# Patient Record
Sex: Female | Born: 1965 | Race: White | Hispanic: No | Marital: Single | Smoking: Never smoker
Health system: Southern US, Community
[De-identification: ages and names within clinical notes are randomized; demographics above are authoritative.]

## PROBLEM LIST (undated history)

## (undated) DIAGNOSIS — I10 Essential (primary) hypertension: Secondary | ICD-10-CM

## (undated) DIAGNOSIS — E559 Vitamin D deficiency, unspecified: Secondary | ICD-10-CM

## (undated) DIAGNOSIS — E785 Hyperlipidemia, unspecified: Secondary | ICD-10-CM

## (undated) DIAGNOSIS — E039 Hypothyroidism, unspecified: Secondary | ICD-10-CM

## (undated) DIAGNOSIS — D689 Coagulation defect, unspecified: Secondary | ICD-10-CM

## (undated) DIAGNOSIS — E669 Obesity, unspecified: Secondary | ICD-10-CM

## (undated) DIAGNOSIS — R7303 Prediabetes: Secondary | ICD-10-CM

## (undated) DIAGNOSIS — D509 Iron deficiency anemia, unspecified: Secondary | ICD-10-CM

## (undated) DIAGNOSIS — N6019 Diffuse cystic mastopathy of unspecified breast: Secondary | ICD-10-CM

## (undated) DIAGNOSIS — I2699 Other pulmonary embolism without acute cor pulmonale: Secondary | ICD-10-CM

## (undated) HISTORY — DX: Coagulation defect, unspecified: D68.9

## (undated) HISTORY — DX: Other pulmonary embolism without acute cor pulmonale: I26.99

## (undated) HISTORY — DX: Obesity, unspecified: E66.9

## (undated) HISTORY — DX: Hypothyroidism, unspecified: E03.9

## (undated) HISTORY — PX: TUBAL LIGATION: SHX77

## (undated) HISTORY — PX: TONSILLECTOMY AND ADENOIDECTOMY: SUR1326

## (undated) HISTORY — DX: Hyperlipidemia, unspecified: E78.5

## (undated) HISTORY — DX: Vitamin D deficiency, unspecified: E55.9

## (undated) HISTORY — DX: Prediabetes: R73.03

## (undated) HISTORY — DX: Essential (primary) hypertension: I10

## (undated) HISTORY — PX: THYROID SURGERY: SHX805

## (undated) HISTORY — DX: Iron deficiency anemia, unspecified: D50.9

## (undated) HISTORY — DX: Diffuse cystic mastopathy of unspecified breast: N60.19

---

## 1999-01-28 ENCOUNTER — Encounter: Payer: Self-pay | Admitting: Internal Medicine

## 1999-01-28 ENCOUNTER — Ambulatory Visit (HOSPITAL_COMMUNITY): Admission: RE | Admit: 1999-01-28 | Discharge: 1999-01-28 | Payer: Self-pay | Admitting: Internal Medicine

## 2000-05-27 ENCOUNTER — Encounter (INDEPENDENT_AMBULATORY_CARE_PROVIDER_SITE_OTHER): Payer: Self-pay

## 2000-05-27 ENCOUNTER — Inpatient Hospital Stay (HOSPITAL_COMMUNITY): Admission: AD | Admit: 2000-05-27 | Discharge: 2000-05-27 | Payer: Self-pay | Admitting: Obstetrics and Gynecology

## 2001-03-04 ENCOUNTER — Inpatient Hospital Stay (HOSPITAL_COMMUNITY): Admission: AD | Admit: 2001-03-04 | Discharge: 2001-03-10 | Payer: Self-pay | Admitting: Obstetrics and Gynecology

## 2001-03-04 ENCOUNTER — Encounter (INDEPENDENT_AMBULATORY_CARE_PROVIDER_SITE_OTHER): Payer: Self-pay | Admitting: Specialist

## 2001-03-05 ENCOUNTER — Encounter: Payer: Self-pay | Admitting: Obstetrics and Gynecology

## 2002-06-11 ENCOUNTER — Encounter: Payer: Self-pay | Admitting: Internal Medicine

## 2002-06-11 ENCOUNTER — Ambulatory Visit (HOSPITAL_COMMUNITY): Admission: RE | Admit: 2002-06-11 | Discharge: 2002-06-11 | Payer: Self-pay | Admitting: Internal Medicine

## 2002-09-30 ENCOUNTER — Encounter: Payer: Self-pay | Admitting: Otolaryngology

## 2002-09-30 ENCOUNTER — Encounter: Admission: RE | Admit: 2002-09-30 | Discharge: 2002-09-30 | Payer: Self-pay | Admitting: Otolaryngology

## 2002-10-14 ENCOUNTER — Other Ambulatory Visit: Admission: RE | Admit: 2002-10-14 | Discharge: 2002-10-14 | Payer: Self-pay | Admitting: Otolaryngology

## 2004-03-31 ENCOUNTER — Encounter: Admission: RE | Admit: 2004-03-31 | Discharge: 2004-03-31 | Payer: Self-pay | Admitting: Endocrinology

## 2004-09-09 ENCOUNTER — Other Ambulatory Visit: Admission: RE | Admit: 2004-09-09 | Discharge: 2004-09-09 | Payer: Self-pay | Admitting: Obstetrics and Gynecology

## 2004-12-11 DIAGNOSIS — I2699 Other pulmonary embolism without acute cor pulmonale: Secondary | ICD-10-CM

## 2004-12-11 HISTORY — DX: Other pulmonary embolism without acute cor pulmonale: I26.99

## 2004-12-11 HISTORY — PX: ABLATION: SHX5711

## 2005-01-20 ENCOUNTER — Inpatient Hospital Stay (HOSPITAL_COMMUNITY): Admission: EM | Admit: 2005-01-20 | Discharge: 2005-01-23 | Payer: Self-pay | Admitting: Emergency Medicine

## 2005-09-11 ENCOUNTER — Other Ambulatory Visit: Admission: RE | Admit: 2005-09-11 | Discharge: 2005-09-11 | Payer: Self-pay | Admitting: Obstetrics and Gynecology

## 2005-10-06 ENCOUNTER — Encounter (INDEPENDENT_AMBULATORY_CARE_PROVIDER_SITE_OTHER): Payer: Self-pay | Admitting: *Deleted

## 2005-10-06 ENCOUNTER — Ambulatory Visit (HOSPITAL_COMMUNITY): Admission: RE | Admit: 2005-10-06 | Discharge: 2005-10-06 | Payer: Self-pay | Admitting: Obstetrics and Gynecology

## 2007-05-17 ENCOUNTER — Ambulatory Visit: Payer: Self-pay | Admitting: Vascular Surgery

## 2007-08-30 ENCOUNTER — Ambulatory Visit: Payer: Self-pay | Admitting: Vascular Surgery

## 2007-10-09 ENCOUNTER — Ambulatory Visit: Payer: Self-pay | Admitting: Vascular Surgery

## 2007-10-15 ENCOUNTER — Ambulatory Visit: Payer: Self-pay | Admitting: Vascular Surgery

## 2007-12-11 ENCOUNTER — Ambulatory Visit: Payer: Self-pay | Admitting: Vascular Surgery

## 2007-12-18 ENCOUNTER — Ambulatory Visit: Payer: Self-pay | Admitting: Vascular Surgery

## 2009-03-25 ENCOUNTER — Ambulatory Visit (HOSPITAL_COMMUNITY): Admission: RE | Admit: 2009-03-25 | Discharge: 2009-03-25 | Payer: Self-pay | Admitting: Internal Medicine

## 2011-01-01 ENCOUNTER — Encounter: Payer: Self-pay | Admitting: Internal Medicine

## 2011-04-25 NOTE — Assessment & Plan Note (Signed)
OFFICE VISIT   Andrea Bush, Andrea Bush  DOB:  [birth year]                                       10/15/2007  [id number]#:[id number]   The patient underwent left greater saphenous laser ablation by Dr. Arbie Cookey  on October 29 with multiple stab phlebectomies in the lateral thigh and  calf, and medial calf regions.  She has done well with no change in her  distal edema, and no discomfort below the knee.  She has had some mild  to moderate aching discomfort along the course of the greater saphenous  vein from the knee to the groin, and some irritation from the elastic  compression stocking with some blistering of the skin medially in the  mid thigh.  She is now wearing an Ace wrap rather than the elastic  stocking for that reason.  She states this all is improving, however.  I  performed a limited venous duplex exam in the office today.  The deep  venous system is widely patent with no evidence of any abnormality and  good flow.  The greater saphenous vein is occluded from near the  saphenofemoral junction to the knee.  There is 1 patent anterior branch,  but it communicates well below the saphenofemoral junction in the thigh.  The patient was reassured regarding these findings.  Will be scheduled  in the near future for laser ablation of her contralateral right side  with stab phlebectomy.   Quita Skye Hart [place], M.D.  Electronically Signed   JDL/MEDQ  D:  10/15/2007  T:  10/16/2007  Job:  [id number]

## 2011-04-25 NOTE — Consult Note (Signed)
VASCULAR SURGERY CONSULTATION   Bush, Andrea  DOB:  [birth year]                                       05/17/2007  [id number]#:[id number]   The patient presents today for evaluation of severe venous hypertension  bilaterally.  She is a very pleasant 45 year old white female with  progressive pain and swelling in both lower extremities.  She has had  lower extremity Dopplers to rule out deep vein thrombosis in the past  but has never had any functional studies.   PAST HISTORY:  1. Significant for pulmonary embolism diagnosed by CT scan in 2006.      This is felt in all likelihood to be related to birth control pills      with hypercoagulability and she was treated with 6 months of      Coumadin.  She did not have any evidence of DVT at that time.  She      does have a prior history of what sounds like superficial      thrombophlebitis on her left saphenous vein from her ankle up into      her thigh.  She describes this occurred several years ago and at      that time had no evidence of DVT by Duplex.  She reports she had      erythema and swelling and pain over her medial thigh which      resolved.  She has had sclerotherapy attempts in the past with      early recurrence.  She currently has pain in both calves and thighs      and these remain tender to touch over the tributary varicosities.      She does have swelling greater than in her left leg than in her      right leg.  She does not have any history of bleeding or skin      changes.  She reports that she takes Aleve constantly for her leg      pain and elevates her legs when she can.  She has not tried      compression garments.  2. Past history is significant for a tonsillectomy at age 48.  69. She had a tubal ligation in October 2006.   She does not smoke or drink alcohol.   MEDICATIONS:  Hydrochlorothiazide for attempting to relieve the swelling  in her leg and levothyroxine.   PHYSICAL EXAMINATION:   General:  She is a well developed, moderately  obese, white female in no acute distress.  Vital signs:  Blood pressure  is 136/86, pulse 72, respirations 16.  Vascular:  Her dorsalis pedis  pulses are 2+ bilaterally.  She has scattered reticular veins most  particularly in her right posterior calf and left lateral thigh with  some telangiectasia around these as well.  She does have bilateral leg  swelling.   She underwent a hand-held Duplex by me in the office and this reveals  distended saphenous veins bilaterally with reflux by color flow Doppler.   I discussed options with Andrea Bush.  I explained that she clearly has  venous hypertension and I suspect this is causing the significant pain  that she is having with prolonged standing.  I have recommended that we  fit her with a graduated compression garments and she has purchased  these today and will begin  wearing these immediately with thigh high  graduated compression stockings.   We will see her back in several months with a formal bilateral venous  Duplex to evaluate her deep system, and also we will determine her  success with compression garments.  I explained that if she fails  conservative treatment, she should be an excellent candidate for laser  ablation of her saphenous vein, assuming her deep system is normal.  We  will discuss this further over her next visit in 3 months.   Andrea Bush, M.D.  Electronically Signed  TFE/MEDQ  D:  05/17/2007  T:  05/17/2007  Job:  [id number]   cc:   [name], D.O.

## 2011-04-25 NOTE — Assessment & Plan Note (Signed)
OFFICE VISIT   Andrea Bush, Andrea Bush  DOB:  [birth year]                                       12/18/2007  EAVWU#:[id number]   Patient presents today for a one-week followup of laser ablation and  stab phlebectomy of right leg greater saphenous vein.  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  She has done well.  She does  have the usual amount of erythema at the ablation site and no specific  discomfort at the phlebectomy site.   She underwent venous duplex showing closure of her saphenous vein and  wide patency of her common femoral vein.   She will continue her usual activities.  Compression garments for one  additional week.  We will see her in six weeks for final followup.   Larina Earthly, M.D.  Electronically Signed   TFE/MEDQ  D:  12/18/2007  T:  12/19/2007  Job:  [id number]

## 2011-04-25 NOTE — Assessment & Plan Note (Signed)
OFFICE VISIT   Andrea, Bush  DOB:  1966/08/03                                       08/30/2007  JYNWG#:[id number]   The patient presents today for continued discussion of her venous  pathology.  I saw her in June.  On review she does have a history of  pulmonary embolism in the past felt to be related to birth control pills  and was on Coumadin therapy for six months.  She was fitted with thigh-  high graduated compression stockings at our last visit.  She reports  these have given her no relief for her discomfort.  She has also  frequently elevated her legs and takes Ibuprofen tid with no relief of  symptoms. She reports she continues to have pain and swelling in her  legs with prolonged standing or prolonged sitting.  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  She is  unable to walk on her treadmill and exercise due to leg pain and  swelling and reports that her calves are very painful and sensitive to  the touch causing difficulty even in sleeping with pressure over the  tributary varicosities in her calf.   PHYSICAL EXAMINATION:  She has no change.  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   She underwent formal duplex today in our office and I discussed this  with her.  This reveals competence in her deep systems bilaterally with  no evidence of reflux or obstruction.  She does have incompetence in her  saphenofemoral junction and greater saphenous vein bilaterally.   PLAN:  I have discussed options with the patient.  I have recommended  that we proceed with staged greater saphenous vein laser ablation and  stab phlebectomy  for symptom relief.  She understands the procedure.  We will proceed with this once we have insurance coverage for her.   Larina Earthly, M.D.  Electronically Signed   TFE/MEDQ  D:  08/30/2007  T:  09/02/2007  Job:  [id number]   cc:   [name],  D.O.

## 2011-04-25 NOTE — Procedures (Signed)
LOWER EXTREMITY VENOUS REFLUX EXAM   INDICATION:  Bilateral legs varicose vein and pain.   EXAM:  Using color-flow imaging and pulse Doppler spectral analysis, the  right and left common femoral, superficial femoral vein, popliteal,  posterior tibial, greater and lesser saphenous veins are evaluated.  There is no evidence suggesting deep venous insufficiency in the right  and left lower extremity.   The right saphenofemoral junction is not competent.  The right and left  greater saphenous vein is not competent.   The right and left proximal short saphenous vein demonstrates  competency.   GSV Diameter (used if found to be incompetent only)                                            Right    Left  Proximal Greater Saphenous Vein           0.56. cm 0.56. cm  Proximal-to-mid-thigh                     0.56. cm 0.56. cm  Mid thigh                                 0.56. cm 0.57. cm  Mid-distal thigh                          0.64. cm 0.57. cm  Distal thigh                              0.64. cm 0.49. cm  Knee                                      0.62. cm 0.58. cm   IMPRESSION:  1. Right and left greater saphenous vein reflux is identified with the      caliber ranging from on the right 0.62 cm to 0.69 cm and on the      left 0.58 cm to 0.98.  2. The right and left greater saphenous vein is not aneurysmal.  3. The right and left greater saphenofemoral junction is not tortuous.  4. The deep venous system is competent.  5. The right and left lesser saphenous vein is competent.  6.  No evidence of DVT noted in bilateral legs.   ___________________________________________  Larina Earthly, M.D.   MG/MEDQ  D:  08/30/2007  T:  08/31/2007  Job:  [id number]

## 2011-04-28 NOTE — Discharge Summary (Signed)
[facility]  Patient:    Andrea Bush, Andrea Bush                    MRN: [id number] Adm. Date:  [id number] Disc. Date: [id number] Attending:  Marcelle Overlie                           Discharge Summary  ADMISSION DIAGNOSES:          1. Intrauterine gestation at 31+5 weeks.                               2. Severe preeclampsia with HELLP syndrome.  DISCHARGE DIAGNOSES:          1. Intrauterine gestation at 31+5 weeks.                               2. Primary low segment transverse cesarean                                  section on March 06, 2001.                               3. Uvulitis.  PROCEDURE:                    Primary low segment transverse cesarean section was performed under the direction of Dr. Annamaria Helling on March ______, 2002 at the [facility].  HISTORY AND PHYSICAL:         The patient was a 45 year old gravida 2, para 0 Caucasian female at 31+[redacted] weeks gestation who was admitted on March 04, 2001 from the office for hypertension, proteinuria, and substernal right upper quadrant and epigastric pain of several days duration.  The patients blood pressures were in the 160/90 range at the office and she was noted to have 3+ proteinuria.  When the patient arrived in triage at the [facility] the blood pressures were noted to be in the 160-180 range over 90s.  The fetal heart rate tracing at that time was noted to be reassuring.  Laboratory studies documented AST 110, ALT 90, LDH 283, uric acid 5.3, hemoglobin 13.1, hematocrit 36.9, platelets 131,000, urine 3+ protein.  The patient was admitted with severe preeclampsia and HELLP syndrome and she received magnesium sulfate for seizure prophylaxis and a course of betamethasone for fetal lung maturity.  The patient was monitored closely at this time.  The patient remained stable during her first 24 hours and then she was noted to develop third spacing of fluid.  On March 27 in the evening  the patient was noted to have elevation of the SGOT to 115 and the SGPT to 121. Her platelet count was noted to be decreasing to 177,000.  At this time discussion was held with the patient regarding her worsening clinical situation and the fact that the betamethasone had been administered for the benefit of fetal lung maturity and a decision was made to proceed with a primary low segment transverse cesarean section at that time.  The patient underwent the primary low segment transverse cesarean section under the direction of Dr. Aldona Bar on March 06, 2001.  A viable female was delivered with Apgars of 6  at one minute and 7 at five minutes.  There were no maternal complications to the surgery.  Upon leaving the operating theater, Dr. Alison Murray, a neonatologist, noted that there was a partial avulsion of the newborns left ear where it was attached to the skull and the newborn underwent further consultation by the pediatric surgeons during the hospitalization.  Postoperatively the patient was continued on magnesium sulfate for approximately 48 hours for seizure prophylaxis.  She was continued on the medication as she was noted to have thrombocytopenia with a platelet count nadiring at 92,000.  Also, the patients LDH was noted to be rising to 477. After this time the patient was noted to have excellent diuresis and good O2 saturations of 96% on room air.  The magnesium was discontinued on the morning of March 09, 2001.  At this time the patient was noted to complain of the feeling that something was swollen in her throat.  She was examined and the uvula was noted to have edema with some erythema and a small amount of white exudate.  The patient had no respiratory distress at this time.  A course of amoxicillin 500 mg p.o. t.i.d. was begun at this time.  The patients postoperative course otherwise had been unremarkable.  She was able to tolerate a regular diet and ambulate independently.  With  ambulation her blood pressures were noted to be in the 160/100 range and therefore upon discharge she was started on Procardia 10 mg p.o. t.i.d.  The patients incision remained without evidence of any erythema or drainage and the staples were removed prior to her discharge.  The patient was found to be ready for discharge on postoperative day #4.  She was discharged to home in good condition.  DISCHARGE MEDICATIONS:        1. Procardia 10 mg p.o. t.i.d.                               2. Percocet one to two p.o. q.4-6h. p.r.n.                               3. Ibuprofen 600 mg p.o. q.d.                               4. Iron sulfate 325 mg p.o. q.d.                               5. Prenatal vitamins one p.o. q.d.                               6. Z-pack to be completed as the patient was                                  noted to have an allergy to penicillin.  DIET:                         Regular.  ACTIVITY:                     The patient will have decreased activity for the next six  weeks.  FOLLOW-UP:                    Later that week for a blood pressure check.  She will call if she experiences fever, increased bleeding, increased pain, headache, dizziness, visual changes, increased swelling, or any other concern. DD:  04/09/01 TD:  04/09/01 Job: [id number]

## 2011-04-28 NOTE — H&P (Signed)
NAMEMABELINE, VARAS              ACCOUNT NO.:  [id number]   MEDICAL RECORD NO.:  [id number]          PATIENT TYPE:  EMS   LOCATION:  ED                           FACILITY:  [facility]   PHYSICIAN:  Toby L. Fugate, D.O.   DATE OF BIRTH:  [birth year]   DATE OF ADMISSION:  01/20/2005  DATE OF DISCHARGE:                                HISTORY & PHYSICAL   PRIMARY CARE PHYSICIAN:  [name], D.O.   CHIEF COMPLAINT:  Pulmonary embolus diagnosed by CT scan.   HISTORY OF PRESENT ILLNESS:  Mrs. Steinhart is a 45 year old Caucasian female  who presented to Dr. Hardie Pulley office on Thursday complaining of left leg  swelling and pain.  Dr. Elisabeth Most ordered lower extremity Dopplers which  were negative.  Subsequent CT scan was positive for pulmonary embolus.  At  this time I do not have these reports available.  These studies were done at  [facility].  The patient has been on birth control since  October.  She has a Health and safety inspector job.  However, she says that she does get up  often throughout the day to walk around.  There has been no recent travel.  There is no history of recent surgery or cancer.  The patient says that her  grandmother did have a blood clot at one point.  Currently the patient is  not short of breath.  Her O2 saturations are 100% on 2 L.  She says that has  a heaviness about her chest.  This heaviness does not move.  There is no  shortness of breath associated with this discomfort.  There is n no nausea,  vomiting, or diaphoresis.   PAST MEDICAL HISTORY/PAST SURGICAL HISTORY:  1.  Preeclampsia with HELLP syndrome.  2.  Cesarean section.  3.  Hypothyroidism.  4.  Tonsillectomy.   MEDICATIONS:  1.  Synthroid 100 mcg p.o. daily.  2.  Birth control.   ALLERGIES:  PENICILLIN causes a rash.   SOCIAL HISTORY:  The patient denies tobacco, alcohol and IV drug abuse.  The  patient lives here in [place] with her husband. She has a Investment banker, corporate  job.   FAMILY HISTORY:   Strong family history of diabetes and, as stated above,  grandmother had a blood clot.   REVIEW OF SYSTEMS:  Complete review of systems was obtained.  The review was  negative except as stated in the HPI.   PHYSICAL EXAMINATION:  VITAL SIGNS: Temperature 98, blood pressure 149/99,  pulse 98, respiratory rate 18, O2 saturation 100% on 2 L.  HEENT: Pupils were equally, round and react to light.  Extraocular muscles  were intact.  There was no scleral icterus.  Tympanic membranes were clear  bilaterally.  Oropharynx was clear and moist.  No erythema or thrush.  NECK:  No JVD.  No adenopathy.  No carotid bruits.  HEART:  Regular rate and rhythm.  No murmur, rub, or gallops.  LUNGS:  Clear to auscultation bilaterally.  No wheezes, rales or rhonchi.  ABDOMEN:  Positive bowel sounds, nontender, nondistended.  EXTREMITIES:  Bilateral extremities showed 1+ edema.  The  left lower  extremity was warm to touch around the medial aspect of the knee.  There was  some erythema of the skin.  The left lower extremity was tender to touch.   LABORATORY DATA:  All labs are pending at this point.   ASSESSMENT/PLAN:  1.  Pulmonary embolus.  I will admit the patient to a telemetry bed since      she is hemodynamically stable.  I will request old records from      [facility].  I will start the patient on heparin per      hospital protocol.  In addition, I will start the patient on Coumadin 5      mg p.o. daily.  I will check daily INR.  Will also check three sets of      cardiac enzymes and an EKG.  Basic labs will be obtained including CBC,      CMP, PT, PTT, and INR.  2.  Hypothyroidism.  I will check a TSH and keep the patient on her      Synthroid.  3.  This patient is full code.      TLF/MEDQ  D:  01/20/2005  T:  01/20/2005  Job:  [id number]

## 2011-04-28 NOTE — Op Note (Signed)
NAME:  Andrea Bush, GALATI NO.:  [id number]   MEDICAL RECORD NO.:  [id number]          PATIENT TYPE:  AMB   LOCATION:  SDC                           FACILITY:  [facility]   PHYSICIAN:  Carrington Clamp, M.D. DATE OF BIRTH:  12/01/1966   DATE OF PROCEDURE:  10/06/2005  DATE OF DISCHARGE:                                 OPERATIVE REPORT   PREOPERATIVE DIAGNOSIS:  Menorrhagia and multiparous, desires permanent  sterility.   POSTOPERATIVE DIAGNOSIS:  Menorrhagia and multiparous, desires permanent  sterility.   PROCEDURES:  Laparoscopic bilateral tubal ligation with Filshie clips,  hysteroscopy, dilation and curettage, and NovaSure ablation.   SURGEON:  Carrington Clamp, M.D.   ASSISTANT:  None.  NovaSure representative, Posey Boyer was in attendance.   ANESTHESIA:  Was general.   SPECIMENS:  Endometrial curettings.   ESTIMATED BLOOD LOSS:  Minimal.   IV FLUIDS:  1500 mL. Urine output was not measured.   COMPLICATIONS:  None.   FINDINGS:  Were a 9 cm sound length to the uterus with a 3.5 cm cervical  length which gave a 5.5 cm cavity length. With the uterus was 4.8, power was  145.   PROCEDURE:  NovaSure ablation took 1 minute 20 seconds. There was normal  ovaries seen bilaterally. The left hand tube was essentially normal but the  right hand tube was adhesed to the anterior fundus. The tube was carefully  traced out to ensure that I had the tube and the not the round ligament. The  round ligament was seen and identified well away from the segments that were  closed with a Filshie clips. The uterine cavity was essentially normal with  just shaggy endometrium.   MEDICATIONS:  Were none.   TECHNIQUE:  After adequate general anesthesia was achieved, the patient was  prepped, draped in sterile fashion in dorsal lithotomy position. The patient  had been given a dose of 40 milligrams of Lovenox subcu prior to the  procedure because her history of PE. A speculum was  placed in the vagina and  the single-tooth tenaculum was placed on the cervix. The Kahn cannula was  placed inside the cervix and the speculum removed. The bladder was drained  with a red rubber catheter during the scope. Attention was then turned to  the abdomen where 2 cm infraumbilical incision was made with scalpel. The  Veress needle was passed into the abdomen without aspiration of bowel  contents or blood. The abdomen was insufflated and the 10 mm trocar placed  without complication. The single operative scope was passed through the  trocar and the Filshie clip instrument with it. As noted on the right-hand  side, the tube was folded upon itself, so I chose to place two clips one on  the proximal isthmic portion and one on the distal isthmic portion. The tube  was able to be traced out and it was proven that both of those segments were  tube and not the round ligament. Attention was then turned to left where two  Filshie clips were placed on the left as well. The clips were seen and  identified around the entirety of each tube. The abdomen was desufflated and  instruments withdrawn from the abdomen keeping the 10 mm trocar in place.  Attention was turned to the vagina. Speculum was placed in the vagina and  the Kahn cannula removed. The cervix was measured at 3.5 cm and the uterus  sounded to 9 cm. The cervix was then dilated up to allow the hysteroscope to  pass and the findings were noted. A vigorous curettage then performed. The  tissue sent to pathology. The cervix was then dilated a little more again  after the hysteroscope confirmed the length of the cervix and the NovaSure  instrument was passed into the cavity. With a measurement of 4.8 cm was  achieved and the cavity passed the CO2 insufflation test. The NovaSure  ablation then proceeded for 1 minute 20 seconds at a power of 145 watts  without complication. The hysteroscope was passed into the uterine cavity  again and good  contact of all surfaces was noted. All instruments were then  withdrawn from the vagina and the sponge forceps placed in the vagina to aid  in the scope above. The cervical tenaculum site was hemostatic.   Gloves were changed and attention was then turned to the abdomen. The scope  was passed into the abdomen and again abdomen insufflated. All the surfaces  were noted to be normal. The scope and trocar were then removed and the 10  mm trocar site fascia was closed with a figure-of-eight stitch of 2-0  Vicryl. The skin was closed with Dermabond. The patient tolerated the  procedure well. She returns to recovery room in stable condition.      Carrington Clamp, M.D.  Electronically Signed     MH/MEDQ  D:  10/06/2005  T:  10/06/2005  Job:  [id number]

## 2011-04-28 NOTE — Op Note (Signed)
[facility]  Patient:    Andrea Bush, Andrea Bush                    MRN: [id number] Proc. Date: 03/06/01 Adm. Date:  [id number] Attending:  Marcelle Overlie                           Operative Report  PATIENT AGE:                  45  PREOPERATIVE DIAGNOSIS:       1. Thirty-two week intrauterine pregnancy.                               2. Severe preeclampsia.                               3. Hemolysis, elevated liver enzymes and low                                  platelet count (HELLP) syndrome with                                  worsening liver function tests, dropping                                  platelet count.   POSTOPERATIVE DIAGNOSIS:      1. Thirty-two week intrauterine pregnancy.                               2. Severe preeclampsia.                               3. Hemolysis, elevated liver enzymes and low                                  platelet count (HELLP) syndrome with                                  worsening liver function tests, dropping                                  platelet count.                               4. Delivery of 3 pound 2 ounce female infant,                                  Apgars 6/7.  OPERATION:                    Primary low transverse cesarean section.  SURGEON:  Gerrit Friends. Aldona Bar, M.D.  ANESTHESIA:                   Spinal, Gretta Cool., M.D.  HISTORY:                      This 45 year old, gravida 2, para 0, was admitted at 22 to [redacted] weeks gestation on March 04, 2001, having been sent form the office with hypertension, proteinuria, and complaints of substernal and right upper quadrant and epigastric pain for several days.  In the office, her blood pressure was 160/92, and she had 3+ proteinuria.  She was sent to triage where it became apparent that she likely had severe preeclampsia and HELLP syndrome.  Her liver function tests were elevated, and her platelet count  was depressed.  She was placed on magnesium sulfate, admitted to antenatal, begun on betamethasone protocol, and watched carefully.  Her betamethasone was completed on the morning of March 26.  During the day on March 26, she was noted to be third spacing fluid.  On the morning of March 27, fetal heart was reactive.  Cervix was long, thick, closed, and presenting part high.  She was almost 24 hours post second dose of betamethasone, and her lab values revealed improvement in her platelet count (probably secondary to betamethasone) and no real change in the SGOT or SGPT. Her output was noted to be increasing.  She was observed during the day on March 27, and lab values were repeated on the afternoon of March 27, and at this time, her liver function tests were noted to be worsening.  The SGOT had risen from 80 to 115, and the SGPT had risen from 98 to 121.  Likewise, her platelet count appeared to be falling, now was 177,000 from a high of 207,000 on the morning of March 27.  (Platelet count had been in the low 100s previously.)   Since the affect of betamethasone was at its maximum at this point in time, and since the patients situation would be likely to worsen over time, I did not feel delay in delivery any further was indicated.  After talking with the patient and her husband, decided to proceed with primary low transverse cesarean section.  DESCRIPTION OF PROCEDURE:     The patient was taken to the operating room. After the satisfactory induction of a spinal anesthetic by Dr. Harvest Forest, the patient was prepped and draped.  The patient had a Foley catheter in place. Once good anesthetic levels were documented and the patient was draped adequately, the procedure was begun.  A Pfannenstiel incision was made, and with minimal difficulty, dissected down sharply to and through the fascia in low transverse fashion.  Muscles were separated in the midline, peritoneum identified and entered  appropriately with care taken to avoid the bowel superiorly and the bladder inferiorly.  At this time, the vesicouterine peritoneum was incised in a low transverse fashion and pushed off from the low uterine segment.  Sharp incision in the uterus with Metzenbaum scissors was then carried out.  The low uterine segment was noted to be extremely thick. With some difficulty, it was finally possible to enter the cavity.  Amniotomy was produced, and fluid was clear.  The cavity was extended laterally, and thereafter delivery of a viable female infant with Apgars of 6/7 was carried out from a vertex position.  The infant was left side of the head down at the time of delivery.  Once  the cord was clamped and cut, the infant was passed off to Dr. Alison Murray. Apgars were noted to be 6/7.  The infant was taken to the intensive care unit in care of the team.  After the cord bloods were collected, the placenta was delivered intact and was sent to pathology labelled appropriately.  The umbilical cord had three vessels.  At this time, the uterus was exteriorized, manually stimulated, and good contractility was noted.  The uterus was rendered free of any remaining products of conception, and then closure of the uterine incision was carried out using #1 Vicryl in a running locking fashion with several figure-of-eight sutures applied afterwards for additional hemostasis.   Tubes and ovaries were noted to be normal.  At this time, the uterus was replaced into the abdomen having been washed of all free blood and clot.  After all counts were noted to be correct, and no foreign bodies were noted to be remaining in the abdominal cavity, closure of the abdomen was carried out.  The abdominal peritoneum was closed with 0 Vicryl in a running fashion.  The muscles were secured with the same.  When assured of good subfascial hemostasis, the fascia was the reapproximated with 0 Vicryl from angle to midline bilaterally.   Subcutaneous tissue was rendered hemostatic, and staples were then used to close the skin. A sterile pressure dressing was applied.  The patient at this time was taken  to the recovery room in satisfactory condition having tolerated the procedure well.  Upon leaving the operating theater, Dr. Alison Murray mentioned that there appeared to be an avulsion where the babys left ear attached to the skull at the superior portion.  This area was not bleeding but probably was going to require having a pediatric surgeon repair it.  I went to the nursery after the procedure to visualize this.  There was about a 1+ cm  what appeared to be an avulsion, and the only way I can figure this possibly had happened, since this was the posterior ear, was that, in placing my hand around the babys head trying to effect delivery, I must have pushed back on the ear and created the avulsion.  Nonetheless, according to Dr. Alison Murray, it looked like it would be fairly simple to fix, and he was going to call the pediatric surgeons to come look at this.  Both mother and the babys father were aware of this and did not appear to be particularly upset that this occurred.  Once in the recovery room, the patient was having good clear urine output. She will be observed in the intensive care unit and continued on magnesium sulfate with labs watched very carefully over the next 24 to 48 hours. Hopefully her HELLP syndrome and severe preeclampsia will now resolve.  All counts noted to be correct.  Estimated blood loss 750 cc. DD:  03/06/01 TD:  03/07/01 Job: [id number]

## 2011-04-28 NOTE — Discharge Summary (Signed)
Andrea Bush, Andrea Bush              ACCOUNT NO.:  [id number]   MEDICAL RECORD NO.:  [id number]          PATIENT TYPE:  INP   LOCATION:  0373                         FACILITY:  [facility]   PHYSICIAN:  Isidor Holts, M.D.  DATE OF BIRTH:  05/28/1966   DATE OF ADMISSION:  01/20/2005  DATE OF DISCHARGE:  01/23/2005                                 DISCHARGE SUMMARY   PMD:  [name], D.O.   DISCHARGE DIAGNOSES:  1.  Bilateral pulmonary emboli.  2.  Mild sinusitis.  3.  Dysthyroidism.   DISCHARGE MEDICATIONS:  1.  Lovenox 120mg  subcutaneously q.12 hourly.  2.  Coumadin 7.5 mg p.o. q.d. or per INR.  3.  Flonase nasal spray, 1 spray each nostril q.d. p.r.n.  4.  Synthroid 100 mcg p.o. q.d.  5.  Oral contraceptive medication discontinued.   PROCEDURE:  Chest CT angiogram dated January 21, 2005.  This showed small  filling defects within the branches of the pulmonary artery to the right  lower lobe and right middle lobe consistent with small pulmonary emboli.  Also question filling defect in branch to the left lower lobe.   CONSULTATIONS:  None.   HISTORY OF PRESENT ILLNESS:  As in H&P of January 20, 2005; however, in  brief, this is 45 year old Caucasian female who presented to her PMD's  office on January 19, 2005, complaining of left leg swelling and pain. Lower  extremity Dopplers at that time were negative. Subsequent chest CT scan was  positive for pulmonary emboli. This was carried out at [facility]. She was referred for admission and management. The patient has a  sedentary job and is currently on oral contraceptive medication. She has a  known history of hypothyroidism.   CLINICAL COURSE:  #1.  Pulmonary emboli.  The patient was admitted for further investigation,  evaluation and management and was managed with anticoagulation, consisting  initially of intravenous heparin infusion and oral Coumadin.  CT chest  angiogram dated January 21, 2005, confirmed  pulmonary emboli predominantly  in the right lung although there is questionable involvement of left lung.  Hospital course was smooth. The patient remained asymptomatic, maintained  oxygen saturation, did not get short of breath, had no chest pain, so that  on January 23, 2005, it was thought appropriate to switch her to  subcutaneous Lovenox injection and arrange discharge.  INR on January 23, 2005 is suboptimal at 1.6.  Oral contraceptive medication has been  discontinued and she has been cautioned to avoid NSAIDs or indeed any other  medication without checking first with her PMD. She may, however, utilize  Tylenol for sundry aches and pains. Thrombophillic screen was arranged on  admission, but results are still pending at time of discharge   #2.  Mild sinusitis. The patient at the time of admission, complained of  sinus headaches and mild nasal congestion. This was managed with p.r.n.  Tylenol and also Flonase nasal spray with satisfactory response.   #3.  History of dysthyroidism on Synthroid replacement therapy.  TSH on  January 20, 2005 was 3.52 i.e. indicative of adequate thyroid  replacement  therapy. No change has been made to her preadmission regimen.   DISPOSITION:  The patient was discharged in satisfactory condition, on  January 23, 2005. She has been cautioned to avoid strenuous activity,  especially straining, lifting, pushing, pulling, etc. until reevaluated by  her PMD. She is recommended off work until seen again by her PMD on February 01, 2005.  Arrangements have been put in place for her to check PT/INR at  her PMD's office on January 25, 2005 at 11:30 a.m.  Dr. Elisabeth Most is to  adjust Coumadin dose thereafter. Followup has been scheduled with Dr. Marisue Brooklyn, PMD, on February 01, 2005 at 10:00 a.m.  This has been  communicated to patient.      CO/MEDQ  D:  01/23/2005  T:  01/23/2005  Job:  [id number]   cc:   [name], D.O.  [address]  [place]  [place] [address]  Fax: [phone]

## 2011-06-30 ENCOUNTER — Other Ambulatory Visit: Payer: Self-pay | Admitting: Obstetrics and Gynecology

## 2013-10-30 ENCOUNTER — Other Ambulatory Visit: Payer: Self-pay | Admitting: Internal Medicine

## 2013-11-10 ENCOUNTER — Other Ambulatory Visit: Payer: Self-pay

## 2013-11-21 ENCOUNTER — Other Ambulatory Visit: Payer: Self-pay | Admitting: Physician Assistant

## 2013-11-21 DIAGNOSIS — E039 Hypothyroidism, unspecified: Secondary | ICD-10-CM

## 2013-11-24 ENCOUNTER — Other Ambulatory Visit (INDEPENDENT_AMBULATORY_CARE_PROVIDER_SITE_OTHER): Payer: Medicare HMO

## 2013-11-24 DIAGNOSIS — E039 Hypothyroidism, unspecified: Secondary | ICD-10-CM

## 2013-11-25 ENCOUNTER — Telehealth: Payer: Self-pay

## 2013-11-25 NOTE — Telephone Encounter (Signed)
LMOM at work and cell with results of TSH and instruction, ok to leave messages per patient registration form

## 2014-09-23 ENCOUNTER — Encounter: Payer: Self-pay | Admitting: Physician Assistant

## 2014-09-26 DIAGNOSIS — E559 Vitamin D deficiency, unspecified: Secondary | ICD-10-CM | POA: Insufficient documentation

## 2014-09-26 DIAGNOSIS — I10 Essential (primary) hypertension: Secondary | ICD-10-CM | POA: Insufficient documentation

## 2014-09-26 DIAGNOSIS — E1169 Type 2 diabetes mellitus with other specified complication: Secondary | ICD-10-CM | POA: Insufficient documentation

## 2014-09-26 DIAGNOSIS — E785 Hyperlipidemia, unspecified: Secondary | ICD-10-CM | POA: Insufficient documentation

## 2014-09-30 ENCOUNTER — Ambulatory Visit (INDEPENDENT_AMBULATORY_CARE_PROVIDER_SITE_OTHER): Payer: Medicare HMO | Admitting: Physician Assistant

## 2014-09-30 ENCOUNTER — Encounter: Payer: Self-pay | Admitting: Physician Assistant

## 2014-09-30 VITALS — BP 120/78 | HR 76 | Temp 98.1°F | Resp 16 | Ht 69.0 in | Wt 279.0 lb

## 2014-09-30 DIAGNOSIS — D509 Iron deficiency anemia, unspecified: Secondary | ICD-10-CM | POA: Insufficient documentation

## 2014-09-30 DIAGNOSIS — R6889 Other general symptoms and signs: Secondary | ICD-10-CM

## 2014-09-30 DIAGNOSIS — N6019 Diffuse cystic mastopathy of unspecified breast: Secondary | ICD-10-CM

## 2014-09-30 DIAGNOSIS — E669 Obesity, unspecified: Secondary | ICD-10-CM

## 2014-09-30 DIAGNOSIS — I10 Essential (primary) hypertension: Secondary | ICD-10-CM

## 2014-09-30 DIAGNOSIS — Z0001 Encounter for general adult medical examination with abnormal findings: Secondary | ICD-10-CM

## 2014-09-30 DIAGNOSIS — E039 Hypothyroidism, unspecified: Secondary | ICD-10-CM | POA: Insufficient documentation

## 2014-09-30 LAB — CBC WITH DIFFERENTIAL/PLATELET
BASOS ABS: 0 10*3/uL (ref 0.0–0.1)
Basophils Relative: 0 % (ref 0–1)
Eosinophils Absolute: 0.2 10*3/uL (ref 0.0–0.7)
Eosinophils Relative: 3 % (ref 0–5)
HEMATOCRIT: 43.4 % (ref 36.0–46.0)
HEMOGLOBIN: 14.9 g/dL (ref 12.0–15.0)
LYMPHS PCT: 44 % (ref 12–46)
Lymphs Abs: 3.5 10*3/uL (ref 0.7–4.0)
MCH: 30.4 pg (ref 26.0–34.0)
MCHC: 34.3 g/dL (ref 30.0–36.0)
MCV: 88.6 fL (ref 78.0–100.0)
MONO ABS: 0.6 10*3/uL (ref 0.1–1.0)
Monocytes Relative: 7 % (ref 3–12)
NEUTROS ABS: 3.6 10*3/uL (ref 1.7–7.7)
NEUTROS PCT: 46 % (ref 43–77)
Platelets: 251 10*3/uL (ref 150–400)
RBC: 4.9 MIL/uL (ref 3.87–5.11)
RDW: 13.2 % (ref 11.5–15.5)
WBC: 7.9 10*3/uL (ref 4.0–10.5)

## 2014-09-30 LAB — HEMOGLOBIN A1C
Hgb A1c MFr Bld: 6.6 % — ABNORMAL HIGH (ref <5.7)
Mean Plasma Glucose: 143 mg/dL — ABNORMAL HIGH (ref <117)

## 2014-09-30 NOTE — Progress Notes (Signed)
Complete Physical  Assessment and Plan: 1. Essential hypertension Can stay off medication, BP controlled, continue DASH diet.   2. Obese Long discussion about weight loss,diet and exercise.   3. Hypothyroidism, unspecified hypothyroidism type Off medications, check TSH  4. Anemia, iron deficiency Check CBC, iron, ferritin  5. Fibrocystic breast, unspecified laterality Suggest 3D MGM, declines breast exam will see Dr. Philis Pique  6. Encounter for general adult medical examination with abnormal findings - CBC with Differential - BASIC METABOLIC PANEL WITH GFR - Lipid panel - Hepatic function panel - TSH - Hemoglobin A1c - Insulin, fasting - Vit D  25 hydroxy (rtn osteoporosis monitoring) - Urinalysis, Routine w reflex microscopic - Microalbumin / creatinine urine ratio - Vitamin B12 - Magnesium - Iron and TIBC - Ferritin - EKG 12-Lead  7. Colonoscopy-   patient declines a colonoscopy even though the risks and benefits were discussed at length. Colon cancer is 3rd most diagnosed cancer and 2nd leading cause of death in both men and women 51 years of age and older. Patient understands the risk of cancer and death with declining the test.    Discussed med's effects and SE's. Screening labs and tests as requested with regular follow-up as recommended.  HPI  48 y.o. female  presents for a complete physical. She has been off all of her medications since Feb.   Her blood pressure has been controlled at home, today their BP is BP: 120/78 mmHg She does workout, she is walking at work. She denies chest pain, shortness of breath, dizziness.  She is not on cholesterol medication and denies myalgias. Her cholesterol is at goal. The cholesterol last visit was:  LDL 121  She has been working on diet and exercise for prediabetes, and denies paresthesia of the feet, polydipsia, polyuria and visual disturbances. Last A1C in the office was: 5.9 (6.3) GFR last visit was 78, BUN 14, Cr  0.87 Patient is on Vitamin D supplement.   BMI is Body mass index is 41.18 kg/(m^2)., her weight is up 15 lbs from last CPE.  Wt Readings from Last 3 Encounters:  09/30/14 279 lb (126.554 kg)    Current Medications:  None  Health Maintenance:   Immunization History  Administered Date(s) Administered  . Pneumococcal-Unspecified 12/12/2003  . Tdap 06/10/2012   Tetanus: 2013 Pneumovax: 2005 Flu vaccine: at work Zostavax: LMP: many months, having some hot flashes.  Pap: follows with Philis Pique 2013 MGM: 06/2012 suggest 3 D MGM  DEXA: Colonoscopy: DUE- but would prefer to wait EGD: Last Dental Exam: Dental works Last Eye Exam: 2 years  Patient Care Team: Unk Pinto, MD as PCP - General (Internal Medicine)  Allergies:  Allergies  Allergen Reactions  . Crestor [Rosuvastatin]     Hair Loss   . Penicillins Rash   Medical History:  Past Medical History  Diagnosis Date  . Hyperlipidemia   . Vitamin D deficiency   . Hypertension   . Prediabetes    Surgical History:  Past Surgical History  Procedure Laterality Date  . Tubal ligation Bilateral   . Tonsillectomy and adenoidectomy    . Ablation  2006  . Thyroid surgery      Nodule benign   Family History:  Family History  Problem Relation Age of Onset  . Cancer Mother     Cancer  . Osteoporosis Mother   . Diabetes Father    Social History:  History  Substance Use Topics  . Smoking status: Never Smoker   . Smokeless tobacco:  Never Used  . Alcohol Use: No    Review of Systems: [X]  = complains of  [ ]  = denies  General: Fatigue [ ]  Fever [ ]  Chills [ ]  Weakness [ ]   Insomnia [ ] Weight change [ ]  Night sweats [ ]   Change in appetite [ ]  Head: Head Trauma [ ]  Eyes: Wears glasses or corrective lens [ ]  Redness [ ]  Blurred vision [ ]  Diplopia [ ]  Discharge [ ]  Floaters [ ]  [id number] [ ]  hearing loss [ ]  Tinnitus [ ]  Ear Discharge [ ]   Congestion [ ]  Sinus Pain [ ]  Post Nasal Drip [ ]  Nose Bleeds [ ]   Rhinorrhea [ ]    Difficulty Swallowing [ ]  Snoring [ ]  Sore Throat [ ]  Cardiac:   Chest pain/pressure [ ]  SOB [ ]  Orthopnea [ ]   Palpitations [ ]   Paroxysmal nocturnal dyspnea[ ]  Claudication [ ]  Edema [ ]  Difficulty walking around block or climbing stairs [ ]  Pulmonary: Cough [ ]  Wheezing[ ]   SOB [ ]   Pleurisy [ ]  Asthma [ ]  GI: Nausea [ ]  Vomiting[ ]  Dysphagia[ ]  Heartburn[ ]  Abdominal pain [ ]  Constipation [ ] ; Diarrhea [ ]  BRBPR [ ]  Melena[ ]  Bloating [ ]  Hemorrhoids [ ]  Incontinence [ ]  GU: Hematuria[ ]  Dysuria [ ]  Nocturia[ ]  Urgency [ ]   Hesitancy [ ]  Discharge [ ]  Frequency [ ]  Incontinence [ ]  Breast:  Dimpling [ ]  Breast lumps [ ]   Breast Lesions [ ]  Nipple discharge [ ]    Neuro: Headaches[ ]  Vertigo[ ]  Paresthesias[ ]  Spasm [ ]  Speech changes [ ]  Incoordination [ ]  Dizziness [ ]  Numbness [ ]  Ortho: Arthritis [ ]  Joint pain [ ]  Muscle pain [ ]  Joint swelling [ ]  Back Pain [ ]  Weakness [ ]  Stiffness [ ]  Skin:  Rash [ ]   Pruritis [ ]  Change in skin lesion [ ]  Change in hair [ ]  Change in nails [ ]  Psych: Depression[ ]  Anxiety[ ]  Stress [ ]  Confusion [ ]  Memory loss [ ]   Heme/Lymph: Bleeding [ ]  Bruising [ ]  History of anemia [ ]  Enlarged lymph nodes [ ]   Endocrine: Visual blurring [ ]  Paresthesia [ ]  Polyuria [ ]  Polydipsia [ ]  Polyphagia [ ]  Heat/cold intolerance [ ]  Hypoglycemia [ ]  Thyroid Issues [ ]  Diabetes [ ]   Physical Exam: Estimated body mass index is 41.18 kg/(m^2) as calculated from the following:   Height as of this encounter: 5\' 9"  (1.753 m).   Weight as of this encounter: 279 lb (126.554 kg). BP 120/78  Pulse 76  Temp(Src) 98.1 F (36.7 C)  Resp 16  Ht 5\' 9"  (1.753 m)  Wt 279 lb (126.554 kg)  BMI 41.18 kg/m2 General Appearance: Well nourished, in no apparent distress.  Eyes: PERRLA, EOMs, conjunctiva no swelling or erythema, normal fundi and vessels.  Sinuses: No Frontal/maxillary tenderness  ENT/Mouth: Ext aud canals clear, normal light reflex with TMs without  erythema, bulging. Good dentition. No erythema, swelling, or exudate on post pharynx. Tonsils not swollen or erythematous. Hearing normal.  Neck: Supple, thyroid normal. No bruits  Respiratory: Respiratory effort normal, BS equal bilaterally without rales, rhonchi, wheezing or stridor.  Cardio: RRR without murmurs, rubs or gallops. Brisk peripheral pulses without edema.  Chest: symmetric, with normal excursions and percussion.  Breasts: defer Abdomen: Soft, nontender, no guarding, rebound, hernias, masses, or organomegaly. .  Lymphatics: Non tender without lymphadenopathy.  Genitourinary: defer Musculoskeletal: Full ROM all peripheral extremities,5/5 strength, and normal  gait.  Skin: Warm, dry without rashes, lesions, ecchymosis. Neuro: Cranial nerves intact, reflexes equal bilaterally. Normal muscle tone, no cerebellar symptoms. Sensation intact.  Psych: Awake and oriented X 3, normal affect, Insight and Judgment appropriate.   EKG: WNL no changes.   Vicie Mutters 11:08 AM [facility]

## 2014-09-30 NOTE — Patient Instructions (Addendum)
Suggest seeing an eye doctor, suggest getting 3 D Mammogram due to dense breast  Preventative Care for Adults - Female      MAINTAIN REGULAR HEALTH EXAMS:  A routine yearly physical is a good way to check in with your primary care provider about your health and preventive screening. It is also an opportunity to share updates about your health and any concerns you have, and receive a thorough all-over exam.   Most health insurance companies pay for at least some preventative services.  Check with your health plan for specific coverages.  WHAT PREVENTATIVE SERVICES DO WOMEN NEED?  Adult women should have their weight and blood pressure checked regularly.   Women age 43 and older should have their cholesterol levels checked regularly.  Women should be screened for cervical cancer with a Pap smear and pelvic exam beginning at either age 47, or 3 years after they become sexually activity.    Breast cancer screening generally begins at age 23 with a mammogram and breast exam by your primary care provider.    Beginning at age 53 and continuing to age 57, women should be screened for colorectal cancer.  Certain people may need continued testing until age 20.  Updating vaccinations is part of preventative care.  Vaccinations help protect against diseases such as the flu.  Osteoporosis is a disease in which the bones lose minerals and strength as we age. Women ages 64 and over should discuss this with their caregivers, as should women after menopause who have other risk factors.  Lab tests are generally done as part of preventative care to screen for anemia and blood disorders, to screen for problems with the kidneys and liver, to screen for bladder problems, to check blood sugar, and to check your cholesterol level.  Preventative services generally include counseling about diet, exercise, avoiding tobacco, drugs, excessive alcohol consumption, and sexually transmitted infections.    GENERAL  RECOMMENDATIONS FOR GOOD HEALTH:  Healthy diet:  Eat a variety of foods, including fruit, vegetables, animal or vegetable protein, such as meat, fish, chicken, and eggs, or beans, lentils, tofu, and grains, such as rice.  Drink plenty of water daily.  Decrease saturated fat in the diet, avoid lots of red meat, processed foods, sweets, fast foods, and fried foods.  Exercise:  Aerobic exercise helps maintain good heart health. At least 30-40 minutes of moderate-intensity exercise is recommended. For example, a brisk walk that increases your heart rate and breathing. This should be done on most days of the week.   Find a type of exercise or a variety of exercises that you enjoy so that it becomes a part of your daily life.  Examples are running, walking, swimming, water aerobics, and biking.  For motivation and support, explore group exercise such as aerobic class, spin class, Zumba, Yoga,or  martial arts, etc.    Set exercise goals for yourself, such as a certain weight goal, walk or run in a race such as a 5k walk/run.  Speak to your primary care provider about exercise goals.  Disease prevention:  If you smoke or chew tobacco, find out from your caregiver how to quit. It can literally save your life, no matter how long you have been a tobacco user. If you do not use tobacco, never begin.   Maintain a healthy diet and normal weight. Increased weight leads to problems with blood pressure and diabetes.   The Body Mass Index or BMI is a way of measuring how much of  your body is fat. Having a BMI above 27 increases the risk of heart disease, diabetes, hypertension, stroke and other problems related to obesity. Your caregiver can help determine your BMI and based on it develop an exercise and dietary program to help you achieve or maintain this important measurement at a healthful level.  High blood pressure causes heart and blood vessel problems.  Persistent high blood pressure should be treated  with medicine if weight loss and exercise do not work.   Fat and cholesterol leaves deposits in your arteries that can block them. This causes heart disease and vessel disease elsewhere in your body.  If your cholesterol is found to be high, or if you have heart disease or certain other medical conditions, then you may need to have your cholesterol monitored frequently and be treated with medication.   Ask if you should have a cardiac stress test if your history suggests this. A stress test is a test done on a treadmill that looks for heart disease. This test can find disease prior to there being a problem.  Menopause can be associated with physical symptoms and risks. Hormone replacement therapy is available to decrease these. You should talk to your caregiver about whether starting or continuing to take hormones is right for you.   Osteoporosis is a disease in which the bones lose minerals and strength as we age. This can result in serious bone fractures. Risk of osteoporosis can be identified using a bone density scan. Women ages 73 and over should discuss this with their caregivers, as should women after menopause who have other risk factors. Ask your caregiver whether you should be taking a calcium supplement and Vitamin D, to reduce the rate of osteoporosis.   Avoid drinking alcohol in excess (more than two drinks per day).  Avoid use of street drugs. Do not share needles with anyone. Ask for professional help if you need assistance or instructions on stopping the use of alcohol, cigarettes, and/or drugs.  Brush your teeth twice a day with fluoride toothpaste, and floss once a day. Good oral hygiene prevents tooth decay and gum disease. The problems can be painful, unattractive, and can cause other health problems. Visit your dentist for a routine oral and dental check up and preventive care every 6-12 months.   Look at your skin regularly.  Use a mirror to look at your back. Notify your  caregivers of changes in moles, especially if there are changes in shapes, colors, a size larger than a pencil eraser, an irregular border, or development of new moles.  Safety:  Use seatbelts 100% of the time, whether driving or as a passenger.  Use safety devices such as hearing protection if you work in environments with loud noise or significant background noise.  Use safety glasses when doing any work that could send debris in to the eyes.  Use a helmet if you ride a bike or motorcycle.  Use appropriate safety gear for contact sports.  Talk to your caregiver about gun safety.  Use sunscreen with a SPF (or skin protection factor) of 15 or greater.  Lighter skinned people are at a greater risk of skin cancer. Don't forget to also wear sunglasses in order to protect your eyes from too much damaging sunlight. Damaging sunlight can accelerate cataract formation.   Practice safe sex. Use condoms. Condoms are used for birth control and to help reduce the spread of sexually transmitted infections (or STIs).  Some of the STIs are gonorrhea (  the clap), chlamydia, syphilis, trichomonas, herpes, HPV (human papilloma virus) and HIV (human immunodeficiency virus) which causes AIDS. The herpes, HIV and HPV are viral illnesses that have no cure. These can result in disability, cancer and death.   Keep carbon monoxide and smoke detectors in your home functioning at all times. Change the batteries every 6 months or use a model that plugs into the wall.   Vaccinations:  Stay up to date with your tetanus shots and other required immunizations. You should have a booster for tetanus every 10 years. Be sure to get your flu shot every year, since 5%-20% of the [place] population comes down with the flu. The flu vaccine changes each year, so being vaccinated once is not enough. Get your shot in the fall, before the flu season peaks.   Other vaccines to consider:  Human Papilloma Virus or HPV causes cancer of the cervix,  and other infections that can be transmitted from person to person. There is a vaccine for HPV, and females should get immunized between the ages of 24 and 43. It requires a series of 3 shots.   Pneumococcal vaccine to protect against certain types of pneumonia.  This is normally recommended for adults age 66 or older.  However, adults younger than 50 years old with certain underlying conditions such as diabetes, heart or lung disease should also receive the vaccine.  Shingles vaccine to protect against Varicella Zoster if you are older than age 87, or younger than 50 years old with certain underlying illness.  Hepatitis A vaccine to protect against a form of infection of the liver by a virus acquired from food.  Hepatitis B vaccine to protect against a form of infection of the liver by a virus acquired from blood or body fluids, particularly if you work in health care.  If you plan to travel internationally, check with your local health department for specific vaccination recommendations.  Cancer Screening:  Breast cancer screening is essential to preventive care for women. All women age 32 and older should perform a breast self-exam every month. At age 50 and older, women should have their caregiver complete a breast exam each year. Women at ages 74 and older should have a mammogram (x-ray film) of the breasts. Your caregiver can discuss how often you need mammograms.    Cervical cancer screening includes taking a Pap smear (sample of cells examined under a microscope) from the cervix (end of the uterus). It also includes testing for HPV (Human Papilloma Virus, which can cause cervical cancer). Screening and a pelvic exam should begin at age 34, or 3 years after a woman becomes sexually active. Screening should occur every year, with a Pap smear but no HPV testing, up to age 63. After age 63, you should have a Pap smear every 3 years with HPV testing, if no HPV was found previously.   Most  routine colon cancer screening begins at the age of 34. On a yearly basis, doctors may provide special easy to use take-home tests to check for hidden blood in the stool. Sigmoidoscopy or colonoscopy can detect the earliest forms of colon cancer and is life saving. These tests use a small camera at the end of a tube to directly examine the colon. Speak to your caregiver about this at age 92, when routine screening begins (and is repeated every 5 years unless early forms of pre-cancerous polyps or small growths are found).

## 2014-10-01 LAB — IRON AND TIBC
%SAT: 22 % (ref 20–55)
IRON: 90 ug/dL (ref 42–145)
TIBC: 415 ug/dL (ref 250–470)
UIBC: 325 ug/dL (ref 125–400)

## 2014-10-01 LAB — HEPATIC FUNCTION PANEL
ALBUMIN: 4 g/dL (ref 3.5–5.2)
ALK PHOS: 47 U/L (ref 39–117)
ALT: 56 U/L — AB (ref 0–35)
AST: 43 U/L — AB (ref 0–37)
BILIRUBIN TOTAL: 0.6 mg/dL (ref 0.2–1.2)
Bilirubin, Direct: 0.1 mg/dL (ref 0.0–0.3)
Indirect Bilirubin: 0.5 mg/dL (ref 0.2–1.2)
TOTAL PROTEIN: 7.4 g/dL (ref 6.0–8.3)

## 2014-10-01 LAB — INSULIN, FASTING: Insulin fasting, serum: 10.5 u[IU]/mL (ref 2.0–19.6)

## 2014-10-01 LAB — BASIC METABOLIC PANEL WITH GFR
BUN: 15 mg/dL (ref 6–23)
CHLORIDE: 103 meq/L (ref 96–112)
CO2: 28 mEq/L (ref 19–32)
Calcium: 9.2 mg/dL (ref 8.4–10.5)
Creat: 0.71 mg/dL (ref 0.50–1.10)
GFR, Est African American: >89 mL/min
Glucose, Bld: 102 mg/dL — ABNORMAL HIGH (ref 70–99)
POTASSIUM: 4 meq/L (ref 3.5–5.3)
SODIUM: 138 meq/L (ref 135–145)

## 2014-10-01 LAB — URINALYSIS, ROUTINE W REFLEX MICROSCOPIC
BILIRUBIN URINE: NEGATIVE
GLUCOSE, UA: NEGATIVE mg/dL
Hgb urine dipstick: NEGATIVE
KETONES UR: NEGATIVE mg/dL
Leukocytes, UA: NEGATIVE
Nitrite: NEGATIVE
PH: 5 (ref 5.0–8.0)
Protein, ur: NEGATIVE mg/dL
SPECIFIC GRAVITY, URINE: 1.025 (ref 1.005–1.030)
Urobilinogen, UA: 0.2 mg/dL (ref 0.0–1.0)

## 2014-10-01 LAB — MICROALBUMIN / CREATININE URINE RATIO
CREATININE, URINE: 179.1 mg/dL
MICROALB UR: 3.2 mg/dL — AB (ref <2.0)
MICROALB/CREAT RATIO: 17.9 mg/g (ref 0.0–30.0)

## 2014-10-01 LAB — FERRITIN: FERRITIN: 96 ng/mL (ref 10–291)

## 2014-10-01 LAB — LIPID PANEL
CHOL/HDL RATIO: 3.8 ratio
CHOLESTEROL: 184 mg/dL (ref 0–200)
HDL: 48 mg/dL (ref >39)
LDL Cholesterol: 111 mg/dL — ABNORMAL HIGH (ref 0–99)
TRIGLYCERIDES: 125 mg/dL (ref <150)
VLDL: 25 mg/dL (ref 0–40)

## 2014-10-01 LAB — MAGNESIUM: MAGNESIUM: 1.6 mg/dL (ref 1.5–2.5)

## 2014-10-01 LAB — VITAMIN B12: Vitamin B-12: 600 pg/mL (ref 211–911)

## 2014-10-01 LAB — TSH: TSH: 2.366 u[IU]/mL (ref 0.350–4.500)

## 2014-10-01 LAB — VITAMIN D 25 HYDROXY (VIT D DEFICIENCY, FRACTURES): Vit D, 25-Hydroxy: 44 ng/mL (ref 30–89)

## 2014-10-05 ENCOUNTER — Telehealth: Payer: Self-pay

## 2014-10-05 NOTE — Telephone Encounter (Signed)
Patient aware of lab results and instructions for 09/30/14 visit

## 2014-12-10 ENCOUNTER — Other Ambulatory Visit: Payer: Self-pay | Admitting: Physician Assistant

## 2014-12-10 ENCOUNTER — Telehealth: Payer: Self-pay | Admitting: Physician Assistant

## 2014-12-10 MED ORDER — AZITHROMYCIN 250 MG PO TABS: ORAL_TABLET | ORAL | Status: DC

## 2014-12-10 MED ORDER — PREDNISONE 20 MG PO TABS: ORAL_TABLET | ORAL | Status: DC

## 2014-12-10 NOTE — Telephone Encounter (Signed)
Patient with sinus pressure, teeth pain, nasal congestion for over a week, with no relief from OTC meds requesting Zpak. Will send in zpak/prednisone taper and she will go to ER if worsening symptoms or come into the office if not better 7-10 days.

## 2015-10-06 ENCOUNTER — Ambulatory Visit (INDEPENDENT_AMBULATORY_CARE_PROVIDER_SITE_OTHER): Payer: Managed Care, Other (non HMO) | Admitting: Physician Assistant

## 2015-10-06 ENCOUNTER — Encounter: Payer: Self-pay | Admitting: Physician Assistant

## 2015-10-06 VITALS — BP 128/70 | HR 97 | Temp 97.2°F | Resp 16 | Ht 69.0 in | Wt 295.0 lb

## 2015-10-06 DIAGNOSIS — E559 Vitamin D deficiency, unspecified: Secondary | ICD-10-CM

## 2015-10-06 DIAGNOSIS — E039 Hypothyroidism, unspecified: Secondary | ICD-10-CM

## 2015-10-06 DIAGNOSIS — D492 Neoplasm of unspecified behavior of bone, soft tissue, and skin: Secondary | ICD-10-CM

## 2015-10-06 DIAGNOSIS — Z79899 Other long term (current) drug therapy: Secondary | ICD-10-CM

## 2015-10-06 DIAGNOSIS — M7989 Other specified soft tissue disorders: Secondary | ICD-10-CM

## 2015-10-06 DIAGNOSIS — Z0000 Encounter for general adult medical examination without abnormal findings: Secondary | ICD-10-CM | POA: Diagnosis not present

## 2015-10-06 DIAGNOSIS — I10 Essential (primary) hypertension: Secondary | ICD-10-CM | POA: Diagnosis not present

## 2015-10-06 DIAGNOSIS — K140 Glossitis: Secondary | ICD-10-CM

## 2015-10-06 DIAGNOSIS — Z0001 Encounter for general adult medical examination with abnormal findings: Secondary | ICD-10-CM

## 2015-10-06 DIAGNOSIS — D509 Iron deficiency anemia, unspecified: Secondary | ICD-10-CM

## 2015-10-06 DIAGNOSIS — E785 Hyperlipidemia, unspecified: Secondary | ICD-10-CM

## 2015-10-06 DIAGNOSIS — N6019 Diffuse cystic mastopathy of unspecified breast: Secondary | ICD-10-CM

## 2015-10-06 DIAGNOSIS — E119 Type 2 diabetes mellitus without complications: Secondary | ICD-10-CM

## 2015-10-06 LAB — CBC WITH DIFFERENTIAL/PLATELET
Basophils Absolute: 0.1 10*3/uL (ref 0.0–0.1)
Basophils Relative: 1 % (ref 0–1)
Eosinophils Absolute: 0.1 10*3/uL (ref 0.0–0.7)
Eosinophils Relative: 2 % (ref 0–5)
HCT: 45.3 % (ref 36.0–46.0)
Hemoglobin: 15.5 g/dL — ABNORMAL HIGH (ref 12.0–15.0)
Lymphocytes Relative: 57 % — ABNORMAL HIGH (ref 12–46)
Lymphs Abs: 3.5 10*3/uL (ref 0.7–4.0)
MCH: 30.9 pg (ref 26.0–34.0)
MCHC: 34.2 g/dL (ref 30.0–36.0)
MCV: 90.2 fL (ref 78.0–100.0)
MPV: 8.6 fL (ref 8.6–12.4)
Monocytes Absolute: 0.4 10*3/uL (ref 0.1–1.0)
Monocytes Relative: 6 % (ref 3–12)
Neutro Abs: 2.1 10*3/uL (ref 1.7–7.7)
Neutrophils Relative %: 34 % — ABNORMAL LOW (ref 43–77)
Platelets: 243 10*3/uL (ref 150–400)
RBC: 5.02 MIL/uL (ref 3.87–5.11)
RDW: 12.9 % (ref 11.5–15.5)
WBC: 6.2 10*3/uL (ref 4.0–10.5)

## 2015-10-06 LAB — URIC ACID: Uric Acid, Serum: 5.9 mg/dL (ref 2.4–7.0)

## 2015-10-06 LAB — BASIC METABOLIC PANEL WITH GFR
BUN: 15 mg/dL (ref 7–25)
CHLORIDE: 101 mmol/L (ref 98–110)
CO2: 29 mmol/L (ref 20–31)
Calcium: 9.3 mg/dL (ref 8.6–10.4)
Creat: 0.62 mg/dL (ref 0.50–1.05)
GFR, Est African American: >89 mL/min (ref >=60)
GFR, Est Non African American: >89 mL/min (ref >=60)
Glucose, Bld: 163 mg/dL — ABNORMAL HIGH (ref 65–99)
POTASSIUM: 4 mmol/L (ref 3.5–5.3)
Sodium: 137 mmol/L (ref 135–146)

## 2015-10-06 LAB — HEPATIC FUNCTION PANEL
ALBUMIN: 4 g/dL (ref 3.6–5.1)
ALK PHOS: 55 U/L (ref 33–130)
ALT: 136 U/L — ABNORMAL HIGH (ref 6–29)
AST: 103 U/L — AB (ref 10–35)
BILIRUBIN DIRECT: 0.1 mg/dL (ref <=0.2)
BILIRUBIN INDIRECT: 0.6 mg/dL (ref 0.2–1.2)
BILIRUBIN TOTAL: 0.7 mg/dL (ref 0.2–1.2)
Total Protein: 7.4 g/dL (ref 6.1–8.1)

## 2015-10-06 LAB — IRON AND TIBC
%SAT: 31 % (ref 11–50)
IRON: 122 ug/dL (ref 45–160)
TIBC: 399 ug/dL (ref 250–450)
UIBC: 277 ug/dL (ref 125–400)

## 2015-10-06 LAB — MAGNESIUM: Magnesium: 1.7 mg/dL (ref 1.5–2.5)

## 2015-10-06 LAB — LIPID PANEL
CHOL/HDL RATIO: 5.3 ratio — AB (ref <=5.0)
Cholesterol: 202 mg/dL — ABNORMAL HIGH (ref 125–200)
HDL: 38 mg/dL — AB (ref >=46)
LDL CALC: 127 mg/dL (ref <130)
Triglycerides: 186 mg/dL — ABNORMAL HIGH (ref <150)
VLDL: 37 mg/dL — AB (ref <30)

## 2015-10-06 MED ORDER — NYSTATIN 100000 UNIT/ML MT SUSP: 5.0000 mL | Freq: Four times a day (QID) | OROMUCOSAL | Status: DC

## 2015-10-06 NOTE — Patient Instructions (Addendum)
Diabetes is a very complicated disease...lets simplify it.  An easy way to look at it to understand the complications is if you think of the extra sugar floating in your blood stream as glass shards floating through your blood stream.    Diabetes affects your small vessels first: 1) The glass shards (sugar) scraps down the tiny blood vessels in your eyes and lead to diabetic retinopathy, the leading cause of blindness in the [place]. Diabetes is the leading cause of newly diagnosed adult (76 to 51 years of age) blindness in the [place].  2) The glass shards scratches down the tiny vessels of your legs leading to nerve damage called neuropathy and can lead to amputations of your feet. More than 60% of all non-traumatic amputations of lower limbs occur in people with diabetes.  3) Over time the small vessels in your brain are shredded and closed off, individually this does not cause any problems but over a long period of time many of the small vessels being blocked can lead to Vascular Dementia.   4) Your kidney's are a filter system and have a "net" that keeps certain things in the body and lets bad things out. Sugar shreds this net and leads to kidney damage and eventually failure. Decreasing the sugar that is destroying the net and certain blood pressure medications can help stop or decrease progression of kidney disease. Diabetes was the primary cause of kidney failure in 44 percent of all new cases in 2011.  5) Diabetes also destroys the small vessels in your penis that lead to erectile dysfunction. Eventually the vessels are so damaged that you may not be responsive to cialis or viagra.   Diabetes and your large vessels: Your larger vessels consist of your coronary arteries in your heart and the carotid vessels to your brain. Diabetes or even increased sugars put you at 300% increased risk of heart attack and stroke and this is why.. The sugar scrapes down your large blood vessels and your body  sees this as an internal injury and tries to repair itself. Just like you get a scab on your skin, your platelets will stick to the blood vessel wall trying to heal it. This is why we have diabetics on low dose aspirin daily, this prevents the platelets from sticking and can prevent plaque formation. In addition, your body takes cholesterol and tries to shove it into the open wound. This is why we want your LDL, or bad cholesterol, below 70.   The combination of platelets and cholesterol over 5-10 years forms plaque that can break off and cause a heart attack or stroke.   PLEASE REMEMBER:  Diabetes is preventable! Up to 41 percent of complications and morbidities among individuals with type 2 diabetes can be prevented, delayed, or effectively treated and minimized with regular visits to a health professional, appropriate monitoring and medication, and a healthy diet and lifestyle.  Varicose Veins Varicose veins are veins that have become enlarged and twisted. CAUSES This condition is the result of valves in the veins not working properly. Valves in the veins help return blood from the leg to the heart. When your calf muscles squeeze, the blood moves up your leg then the valves close and this continues until the blood gets back to your heart.  If these valves are damaged, blood flows backwards and backs up into the veins in the leg near the skin OR if your are sitting/standing for a long time without using your calf muscles  the blood will back up into the veins in your legs. This causes the veins to become larger. People who are on their feet a lot, sit a lot without walking (like on a plane, at a desk, or in a car), who are pregnant, or who are overweight are more likely to develop varicose veins. SYMPTOMS   Bulging, twisted-appearing, bluish veins, most commonly found on the legs.  Leg pain or a feeling of heaviness. These symptoms may be worse at the end of the day.  Leg swelling.  Skin color  changes. DIAGNOSIS  Varicose veins can usually be diagnosed with an exam of your legs by your caregiver. He or she may recommend an ultrasound of your leg veins. TREATMENT  Most varicose veins can be treated at home.However, other treatments are available for people who have persistent symptoms or who want to treat the cosmetic appearance of the varicose veins. But this is only cosmetic and they will return if not properly treated. These include:  Laser treatment of very small varicose veins.  Medicine that is shot (injected) into the vein. This medicine hardens the walls of the vein and closes off the vein. This treatment is called sclerotherapy. Afterwards, you may need to wear clothing or bandages that apply pressure.  Surgery. HOME CARE INSTRUCTIONS   Do not stand or sit in one position for long periods of time. Do not sit with your legs crossed. Rest with your legs raised during the day.  Your legs have to be higher than your heart so that gravity will force the valves to open, so please really elevate your legs.   Wear elastic stockings or support hose. Do not wear other tight, encircling garments around the legs, pelvis, or waist.  ELASTIC THERAPY  has a wide variety of well priced compression stockings. [place], [place] [address] [address] [place] ARE COPPER INFUSED COMPRESSION SOCKS AT [facility] OR [facility]  Walk as much as possible to increase blood flow.  Raise the foot of your bed at night with 2-inch blocks.  If you get a cut in the skin over the vein and the vein bleeds, lie down with your leg raised and press on it with a clean cloth until the bleeding stops. Then place a bandage (dressing) on the cut. See your caregiver if it continues to bleed or needs stitches. SEEK MEDICAL CARE IF:   The skin around your ankle starts to break down.  You have pain, redness, tenderness, or hard swelling developing in your leg over a vein.  You are uncomfortable due  to leg pain. Document Released: 09/06/2005 Document Revised: 02/19/2012 Document Reviewed: 01/23/2011 [facility] Patient Information 2014 [place].   Glossitis Glossitis is inflammation of the tongue. This may be a stand-alone condition or it may be a symptom of a different condition that you have. Generally, glossitis goes away when its cause is identified and treated. Glossitis can be dangerous if it causes difficulty breathing. CAUSES  This condition can be caused by many different things. In some cases, the cause may not be known. Certain underlying conditions may cause glossitis, such as:   Viral, bacterial, or yeast infections.  Allergies.  Dysfunction of the skin and mucous membranes. This can happen with certain autoimmune disorders.  Abnormal tissue growths (tumors).  Lack of healthy red blood cells (anemia).  Movement of stomach acid into the tube that connects the mouth and the stomach (gastroesophageal reflux).  Lack of proper nutrition or  certain vitamins.  Certain lifelong (chronic) medical conditions, such as diabetes. Sometimes, glossitis may not be caused by an underlying condition. In these cases, glossitis may be caused by:   Use of tobacco products, such as cigarettes, chewing tobacco, or e-cigarettes.  Excessive alcohol use.  Tongue injury or irritation.  Certain medicines, such as medicines to treat cancer. RISK FACTORS This condition is more likely to develop in:   People who are 50 years or older.  Men.  People who are taking antibiotics or steroids, such as asthma medicines.  People who drink alcohol excessively.  People who use tobacco products, including cigarettes, chewing tobacco, or e-cigarettes.  People with chronic medical conditions, such as immune diseases or cancer.  People who do not brush or floss their teeth regularly.  People who lack proper nutrition or are anemic. SYMPTOMS  Symptoms of this condition vary depending on  the cause. Symptoms may include:   Swelling of the tongue.  Pain and tenderness in the tongue. Sometimes, this condition is painless.  Changes in tongue color. The tongue may be pale or bright red.  Smooth areas on the tongue's surface.  A small mass of tissue (node) or white patch on the tongue.  Difficulty chewing, swallowing, or talking.  Difficulty breathing. DIAGNOSIS  This condition is diagnosed based on a physical exam and medical history. Your health care provider may ask you about your eating and drinking habits. You may have tests, including:   Blood tests.   Removal of a small amount of cells from the tongue that are examined under a microscope (biopsy).  You may be given the name of a dentist or a health care provider who specializes in ear, nose, and throat (ENT) problems (otolaryngologist). TREATMENT  Treatment for this condition depends on the underlying cause, and may include:   Following instructions from your health care provider about keeping your mouth clean and avoiding irritants that may have caused your condition or made it worse.  Nutritional therapy. Your health care provider may tell you to change your eating and drinking habits or take a nutritional supplement.  Managing underlying conditions that may have caused your glossitis.  Medicines, such as:  Corticosteroids to reduce inflammation.  Antibiotics if your condition was caused by an infection.  Local anestheticsthat numb your tongue or mouth (local anesthetics). HOME CARE INSTRUCTIONS  Keep your teeth and mouth clean. This includes brushing and flossing frequently and having regular dental checkups.  If you wear dentures or dental braces, work with your dentist to make sure they fit correctly.  Eat healthy foods. Follow instructions from your health care provider about eating or drinking restrictions.  Avoid tobacco products, including cigarettes, chewing tobacco, or e-cigarettes. If you  need help quitting, ask your health care provider.  Avoid excessive alcohol use.  Avoid any irritants that may have caused your condition or made it worse, such as chemicals or certain foods.  Keep all follow-up visits as told by your health care provider. This is important. SEEK MEDICAL CARE IF:  You have a fever.  You develop new symptoms.  You have symptoms that do not get better with medicine or get worse.  You have symptoms that do not go away after 10 days.  You cannot eat or drink because of your pain. SEEK IMMEDIATE MEDICAL CARE IF:   You have severe pain or swelling.  You have difficulty breathing, swallowing, or talking.   This information is not intended to replace advice given to you  by your health care provider. Make sure you discuss any questions you have with your health care provider.   Document Released: 11/17/2002 Document Revised: 08/18/2015 Document Reviewed: 04/14/2015 Elsevier Interactive Patient Education 2016 [place] you to get the 3D Mammogram  The 3D Mammogram is much more specific and sensitive to pick up breast cancer. For women with fibrocystic breast or lumpy breast it can be hard to determine if it is cancer or not but the 3D mammogram is able to tell this difference which cuts back on unneeded additional tests or scary call backs.   - over 40% increase in detection of breast cancer - over 40% reduction in false positives.  - fewer call backs - reduced anxiety - improved outcomes - [place] [facility]  GET APPOINTMENT WITH dr. Philis Pique  [facility] [place]  7 a.m.-6:30 p.m., Monday 7 a.m.-5 p.m., Tuesday-Friday Schedule an appointment by calling [phone].  [facility] Schedule an appointment by calling [phone].   We want weight loss that will last so you should lose 1-2 pounds a week.  THAT IS IT! Please pick THREE things a month to change. Once it is a habit check off the item. Then pick  another three items off the list to become habits.  If you are already doing a habit on the list GREAT!  Cross that item off! o Don't drink your calories. Ie, alcohol, soda, fruit juice, and sweet tea.  o Drink more water. Drink a glass when you feel hungry or before each meal.  o Eat breakfast - Complex carb and protein (likeDannon light and fit yogurt, oatmeal, fruit, eggs, [place] bacon). o Measure your cereal.  Eat no more than one cup a day. (ie [place]) o Eat an apple a day. o Add a vegetable a day. o Try a new vegetable a month. o Use Pam! Stop using oil or butter to cook. o Don't finish your plate or use smaller plates. o Share your dessert. o Eat sugar free Jello for dessert or frozen grapes. o Don't eat 2-3 hours before bed. o Switch to whole wheat bread, pasta, and brown rice. o Make healthier choices when you eat out. No fries! o Pick baked chicken, NOT fried. o Don't forget to SLOW DOWN when you eat. It is not going anywhere.  o Take the stairs. o Park far away in the parking lot o [employer] (or weights) for 10 minutes while watching TV. o Walk at work for 10 minutes during break. o Walk outside 1 time a week with your friend, kids, dog, or significant other. o Start a walking group at [place] the mall as much as you can tolerate.  o Keep a food diary. o Weigh yourself daily. o Walk for 15 minutes 3 days per week. o Cook at home more often and eat out less.  If life happens and you go back to old habits, it is okay.  Just start over. You can do it!   If you experience chest pain, get short of breath, or tired during the exercise, please stop immediately and inform your doctor.

## 2015-10-06 NOTE — Progress Notes (Signed)
Complete Physical  Assessment and Plan: 1. Essential hypertension - continue medications, DASH diet, exercise and monitor at home. Call if greater than 130/80.  - CBC with Differential/Platelet - BASIC METABOLIC PANEL WITH GFR - Hepatic function panel - EKG 12-Lead  2. Hyperlipidemia  check lipids, decrease fatty foods, increase activity.  - Lipid panel  3. Diabetes mellitus without complication ([place]) With weight gain, has become diabetic Will add on bASA, have discussed MF and farxiga- willing to add on medications if needed.  Follow up 3 months due to DM - Hemoglobin A1c - Urinalysis, Routine w reflex microscopic (not at [facility]) - Microalbumin / creatinine urine ratio - EKG 12-Lead  4. Hypothyroidism, unspecified hypothyroidism type Off medication, will monitor labs.  - TSH  5. Morbid obesity, unspecified obesity type ([place]) Obesity with co morbidities- long discussion about weight loss, diet, and exercise  6. Vitamin D deficiency - Vit D  25 hydroxy (rtn osteoporosis monitoring)  7. Fibrocystic breast, unspecified laterality GET MGM, will follow up with Dr. Philis Pique. Declines breast exam/PAP today.   8. Anemia, iron deficiency - Iron and TIBC - Ferritin - Vitamin B12  9. Encounter for general adult medical examination with abnormal findings - CBC with Differential/Platelet - BASIC METABOLIC PANEL WITH GFR - Hepatic function panel - TSH - Lipid panel - Hemoglobin A1c - Magnesium - Vit D  25 hydroxy (rtn osteoporosis monitoring) - Urinalysis, Routine w reflex microscopic (not at [facility]) - Microalbumin / creatinine urine ratio - Iron and TIBC - Ferritin - Vitamin B12 - Uric acid - EKG 12-Lead - D-dimer, quantitative (not at [facility])  10. Glossitis Has some irritation around tongue and 1 papillae that is swollen/tender, mouth wash sent in, check iron/B12, avoid hot spicy foods/drinks. If not better will refer.   11. Left leg swelling Has history of PE/DVT in the  past due to BCP, no estrogen, no travel, no surgery but has been more inactive, will check Ddimer and uric acid to rule out gout/DVT, otherwise likely from inactivity/weight gain- compression stockings, elevate, weight loss advised.  - Uric acid - D-dimer, quantitative (not at [facility])  12. Abnormal skin growth Left chest with scaly area with bleeding x 1-2 months, will schedule removal, has several skin tags on bilateral legs, axilla wants removed.   13. Colonoscopy-   patient declines a colonoscopy even though the risks and benefits were discussed at length. Colon cancer is 3rd most diagnosed cancer and 2nd leading cause of death in both men and women 62 years of age and older. Patient understands the risk of cancer and death with declining the test.    Discussed med's effects and SE's. Screening labs and tests as requested with regular follow-up as recommended.  HPI  49 y.o. female  presents for a complete physical. She has been off all of her medications since Feb.   Her blood pressure has been controlled at home, today their BP is BP: 128/70 mmHg  She has been having left foot swelling over the summer, some discomfort, no warmth, redness. Goes down in the AM or with elevation. She does have a history of PE in 2006 due to BCP. No recent sugery, no recent travel, + inactivity.  She also states that she has a place on her tongue that has been swollen, irritated, put baking soda on it that did not help.  She does workout, she is walking at work. She denies chest pain, shortness of breath, dizziness.  She is not on cholesterol medication  and denies myalgias. Her cholesterol is at goal. The cholesterol last visit was:  Lab Results  Component Value Date   CHOL 184 09/30/2014   HDL 48 09/30/2014   LDLCALC 111* 09/30/2014   TRIG 125 09/30/2014   CHOLHDL 3.8 09/30/2014    She has been working on diet and exercise for prediabetes but has been in the DM range, and denies paresthesia of the feet,  polydipsia, polyuria and visual disturbances. Last A1C in the office was: Lab Results  Component Value Date   HGBA1C 6.6* 09/30/2014   Lab Results  Component Value Date   GFRNONAA >89 09/30/2014   Patient is on Vitamin D supplement.   Lab Results  Component Value Date   VD25OH 44 09/30/2014   BMI is Body mass index is 43.54 kg/(m^2)., her weight is up again. She will drink diet pepsi, water. Work has been stressful, still working at [employer].  Wt Readings from Last 3 Encounters:  10/06/15 295 lb (133.811 kg)  09/30/14 279 lb (126.554 kg)   She is not on thyroid medication. Her medication was not changed last visit.   Lab Results  Component Value Date   TSH 2.366 09/30/2014   Current Medications:  None  Health Maintenance:   Immunization History  Administered Date(s) Administered  . Pneumococcal-Unspecified 12/12/2003  . Tdap 06/10/2012   Tetanus: 2013 Pneumovax: 2005 Prevnar 13: NA Flu vaccine: declines Zostavax: NA LMP: No LMP recorded. Patient is postmenopausal. Has had 1 this year, having hot flashes Pap: follows with Philis Pique 2013, DUE MGM: 06/2012 suggest 3 D MGM  DUE DEXA: N/A Colonoscopy: DUE- but would prefer to wait, declines hemoccult cards EGD:  N/A Last Dental Exam: Dental works Last Eye Exam: 3 years  Patient Care Team: Unk Pinto, MD as PCP - General (Internal Medicine)  Allergies:  Allergies  Allergen Reactions  . Crestor [Rosuvastatin]     Hair Loss   . Penicillins Rash   Medical History:  Past Medical History  Diagnosis Date  . Hyperlipidemia   . Vitamin D deficiency   . Hypertension   . Prediabetes   . Obesity     BMI 41  . Pulmonary embolism ([place]) 2006    from BCP  . Hypothyroidism   . Fibrocystic breast   . Iron deficiency anemia    Surgical History:  Past Surgical History  Procedure Laterality Date  . Tubal ligation Bilateral   . Tonsillectomy and adenoidectomy    . Ablation  2006  . Thyroid surgery       Nodule benign   Family History:  Family History  Problem Relation Age of Onset  . Cancer Mother     Cancer  . Osteoporosis Mother   . Diabetes Father    Social History:  Social History  Substance Use Topics  . Smoking status: Never Smoker   . Smokeless tobacco: Never Used  . Alcohol Use: No   Review of Systems  Constitutional: Positive for malaise/fatigue. Negative for fever, chills, weight loss and diaphoresis.  HENT: Negative.        + snoring, declines sleep study at this time due to cost  Eyes: Negative.  Negative for blurred vision.  Respiratory: Negative.  Negative for cough, hemoptysis, shortness of breath and wheezing.   Cardiovascular: Positive for leg swelling. Negative for chest pain, palpitations, orthopnea, claudication and PND.  Gastrointestinal: Positive for heartburn (depending on what she eats). Negative for nausea, vomiting, abdominal pain, diarrhea, constipation, blood in stool and melena.  Genitourinary: Negative.   Musculoskeletal: Negative.  Negative for falls.  Skin: Negative.        Has left chest scaly nodule that is bleeding x 1 month, nonhealing.   Neurological: Negative.  Negative for weakness.  Psychiatric/Behavioral: Negative.  Negative for depression and suicidal ideas. The patient does not have insomnia.     Physical Exam: Estimated body mass index is 43.54 kg/(m^2) as calculated from the following:   Height as of this encounter: 5\' 9"  (1.753 m).   Weight as of this encounter: 295 lb (133.811 kg). BP 128/70 mmHg  Pulse 97  Temp(Src) 97.2 F (36.2 C) (Temporal)  Resp 16  Ht 5\' 9"  (1.753 m)  Wt 295 lb (133.811 kg)  BMI 43.54 kg/m2  SpO2 99% General Appearance: Well nourished, in no apparent distress.  Eyes: PERRLA, EOMs, conjunctiva no swelling or erythema, normal fundi and vessels.  Sinuses: No Frontal/maxillary tenderness  ENT/Mouth: Ext aud canals clear, normal light reflex with TMs without erythema, bulging. Good dentition. No  erythema, swelling, or exudate on post pharynx. Tonsils not swollen or erythematous. Hearing normal. Tongue with 1 enlarged, tender papillae right of the midline, no masses on tongue or in mouth, some glossitis.  Neck: Supple, thyroid normal. No bruits  Respiratory: Respiratory effort normal, BS equal bilaterally without rales, rhonchi, wheezing or stridor.  Cardio: RRR without murmurs, rubs or gallops. Brisk peripheral pulses with edema in left leg with some warmth at her ankle, leg is 1-2 x size of right leg.  Chest: symmetric, with normal excursions and percussion.  Breasts: defer Abdomen: Soft, nontender, no guarding, rebound, hernias, masses, or organomegaly. .  Lymphatics: Non tender without lymphadenopathy.  Genitourinary: defer Musculoskeletal: Full ROM all peripheral extremities,5/5 strength, and normal gait.  Skin: Left chest with scaly erythematous nodule. Warm, dry without rashes, lesions, ecchymosis. Neuro: Cranial nerves intact, reflexes equal bilaterally. Normal muscle tone, no cerebellar symptoms. Sensation intact.  Psych: Awake and oriented X 3, normal affect, Insight and Judgment appropriate.   EKG: WNL no changes.   Vicie Mutters 10:08 AM [facility]

## 2015-10-07 LAB — URINALYSIS, ROUTINE W REFLEX MICROSCOPIC
BILIRUBIN URINE: NEGATIVE
GLUCOSE, UA: NEGATIVE
Hgb urine dipstick: NEGATIVE
Ketones, ur: NEGATIVE
Leukocytes, UA: NEGATIVE
Nitrite: NEGATIVE
PH: 5 (ref 5.0–8.0)
Protein, ur: NEGATIVE
SPECIFIC GRAVITY, URINE: 1.023 (ref 1.001–1.035)

## 2015-10-07 LAB — FERRITIN: FERRITIN: 231 ng/mL (ref 10–291)

## 2015-10-07 LAB — D-DIMER, QUANTITATIVE: D-Dimer, Quant: 0.97 ug/mL-FEU — ABNORMAL HIGH (ref 0.00–0.48)

## 2015-10-07 LAB — MICROALBUMIN / CREATININE URINE RATIO
Creatinine, Urine: 164 mg/dL (ref 20–320)
Microalb Creat Ratio: 22 mcg/mg creat (ref <30)
Microalb, Ur: 3.6 mg/dL

## 2015-10-07 LAB — VITAMIN B12: Vitamin B-12: 858 pg/mL (ref 211–911)

## 2015-10-07 LAB — HEMOGLOBIN A1C
HEMOGLOBIN A1C: 8.4 % — AB (ref <5.7)
Mean Plasma Glucose: 194 mg/dL — ABNORMAL HIGH (ref <117)

## 2015-10-07 LAB — VITAMIN D 25 HYDROXY (VIT D DEFICIENCY, FRACTURES): Vit D, 25-Hydroxy: 45 ng/mL (ref 30–100)

## 2015-10-07 LAB — TSH: TSH: 2.768 u[IU]/mL (ref 0.350–4.500)

## 2015-10-07 NOTE — Addendum Note (Signed)
Addended by: Vicie Mutters R on: 10/07/2015 11:00 AM   Modules accepted: Orders, SmartSet

## 2015-10-29 ENCOUNTER — Ambulatory Visit (INDEPENDENT_AMBULATORY_CARE_PROVIDER_SITE_OTHER): Payer: Managed Care, Other (non HMO) | Admitting: Physician Assistant

## 2015-10-29 ENCOUNTER — Encounter: Payer: Self-pay | Admitting: Physician Assistant

## 2015-10-29 VITALS — BP 122/80 | HR 86 | Temp 97.5°F | Resp 16 | Ht 69.0 in | Wt 291.0 lb

## 2015-10-29 DIAGNOSIS — L918 Other hypertrophic disorders of the skin: Secondary | ICD-10-CM

## 2015-10-29 DIAGNOSIS — L089 Local infection of the skin and subcutaneous tissue, unspecified: Secondary | ICD-10-CM

## 2015-10-29 NOTE — Progress Notes (Signed)
Skin Tag Removal Procedure Note  Pre-operative Diagnosis: Classic skin tags (acrochordon)  Post-operative Diagnosis: Classic skin tags (acrochordon)  Locations: 5 under left axilla, 3 under right axilla, and 15 medial thighs which are irritated due to locations.   Indications:  Patient complains of skin tags that are recurrently irritated.   Anesthesia: Lidocaine 1% without epinephrine  Procedure Details  The risks (including bleeding and infection) and benefits of the procedure and Verbal informed consent obtained. Using sterile iris scissors, multiple skin tags were snipped off at their bases after cleansing with Alcohol.  Bleeding was controlled with electrocautery.   Procedure Code  11200 Skins tags up to 15 11201 up to 10 additional  Findings: Pathognomonic benign lesions  not sent for pathological exam.  Condition: Stable  Complications: none.  Plan: 1. Instructed to keep the wounds dry and covered for 24-48h and clean thereafter. 2. Warning signs of infection were reviewed.   3. Recommended that the patient use OTC acetaminophen as needed for pain.  4. Return as needed.  Future Appointments Date Time Provider [place]  11/03/2015 11:15 AM Vicie Mutters, PA-C [facility] None  01/12/2016 4:30 PM Vicie Mutters, PA-C [facility] None  10/05/2016 10:00 AM Vicie Mutters, PA-C [facility] None

## 2015-11-03 ENCOUNTER — Ambulatory Visit: Payer: Self-pay | Admitting: Physician Assistant

## 2015-11-15 ENCOUNTER — Encounter: Payer: Self-pay | Admitting: Physician Assistant

## 2015-11-15 ENCOUNTER — Ambulatory Visit (INDEPENDENT_AMBULATORY_CARE_PROVIDER_SITE_OTHER): Payer: Managed Care, Other (non HMO) | Admitting: Physician Assistant

## 2015-11-15 VITALS — BP 122/70 | HR 68 | Temp 97.9°F | Resp 16 | Ht 69.0 in | Wt 290.0 lb

## 2015-11-15 DIAGNOSIS — I10 Essential (primary) hypertension: Secondary | ICD-10-CM | POA: Diagnosis not present

## 2015-11-15 DIAGNOSIS — E119 Type 2 diabetes mellitus without complications: Secondary | ICD-10-CM | POA: Diagnosis not present

## 2015-11-15 DIAGNOSIS — E785 Hyperlipidemia, unspecified: Secondary | ICD-10-CM

## 2015-11-15 MED ORDER — FLUCONAZOLE 150 MG PO TABS: ORAL_TABLET | ORAL | Status: DC

## 2015-11-15 MED ORDER — DAPAGLIFLOZIN PROPANEDIOL 10 MG PO TABS: 10.0000 mg | ORAL_TABLET | Freq: Every day | ORAL | Status: DC

## 2015-11-15 MED ORDER — CYCLOBENZAPRINE HCL 10 MG PO TABS: 10.0000 mg | ORAL_TABLET | Freq: Every day | ORAL | Status: DC

## 2015-11-15 NOTE — Patient Instructions (Addendum)
Please start 5 mg of farxiga for 1 week then go up to 10mg  of farxiga. This can decreases your blood pressure so please contact us if you have any dizziness, sometime we need to decrease or stop fluid pills or decrease BP meds. You are peeing out 300-400 calories a day of sugar which can lead to yeast infections, you can take the diflucan as needed but if the infections continue we will stop the medications. We will have you follow up in 1 month to check your kidney function and weight. Call if you need anything.     Your A1C is a measure of your sugar over the past 3 months and is not affected by what you have eaten over the past few days. Diabetes increases your chances of stroke and heart attack over 300 % and is the leading cause of blindness and kidney failure in the [place]. Please make sure you decrease bad carbs like white bread, white rice, potatoes, corn, soft drinks, pasta, cereals, refined sugars, sweet tea, dried fruits, and fruit juice. Good carbs are okay to eat in moderation like sweet potatoes, brown rice, whole grain pasta/bread, most fruit (except dried fruit) and you can eat as many veggies as you want.   Greater than 6.5 is considered diabetic. Between 6.4 and 5.7 is prediabetic If your A1C is less than 5.7 you are NOT diabetic.  Targets for Glucose Readings: Time of Check Target for patients WITHOUT Diabetes Target for DIABETICS  Before Meals Less than 100  less than 150  Two hours after meals Less than 200  Less than 250      Bad carbs also include fruit juice, alcohol, and sweet tea. These are empty calories that do not signal to your brain that you are full.   Please remember the good carbs are still carbs which convert into sugar. So please measure them out no more than 1/2-1 cup of rice, oatmeal, pasta, and beans  Veggies are however free foods! Pile them on.   Not all fruit is created equal. Please see the list below, the fruit at the bottom is higher in  sugars than the fruit at the top. Please avoid all dried fruits.     Recommendations For Diabetic/Prediabetic Patients:   -  Take medications as prescribed  -  Recommend Dr Fara Olden Fuhrman's book "The End of Diabetes "  And "The End of Dieting"- Can get at  [email or web address].com and encourage also get the Audio CD book  - AVOID Animal products, ie. Meat - red/white, Poultry and Dairy/especially cheese - Exercise at least 5 times a week for 30 minutes or preferably daily.  - No Smoking - Drink less than 2 drinks a day.  - Monitor your feet for sores - Have yearly Eye Exams - Recommend annual Flu vaccine  - Recommend Pneumovax and Prevnar vaccines - Shingles Vaccine (Zostavax) if over 77 y.o.  Goals:   - BMI less than 24 - Fasting sugar less than 130 or less than 150 if tapering medicines to lose weight  - Systolic BP less than [id number]  - Diastolic BP less than 80 - Bad LDL Cholesterol less than 70 - Triglycerides less than 150

## 2015-11-15 NOTE — Progress Notes (Signed)
Diabetes Education and Follow-Up Visit  49 y.o.female presents for diabetic education. She complains of nothing. Patient denies paresthesia of the feet, polydipsia, polyuria and visual disturbances. The patient is not on an ACE/ARB, she is on a low dose aspirin at this time. The patient does exercise, walking some after work but has been very stressed. She does not currently smoke, and does not currently drink alcohol.  The patient is not checking her sugars at home, does have a meter.   She states she has neck pain and trouble sleeping, has had muscle relaxer in the past and would like to try it again. 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   Current Outpatient Prescriptions on File Prior to Visit  Medication Sig Dispense Refill  . nystatin (MYCOSTATIN) 100000 UNIT/ML suspension Take 5 mLs (500,000 Units total) by mouth 4 (four) times daily. Swish and swallow 240 mL 0   No current facility-administered medications on file prior to visit.   Past Medical History  Diagnosis Date  . Hyperlipidemia   . Vitamin D deficiency   . Hypertension   . Prediabetes   . Obesity     BMI 41  . Pulmonary embolism ([place]) 2006    from BCP  . Hypothyroidism   . Fibrocystic breast   . Iron deficiency anemia    ROS: no polyuria or polydipsia, no chest pain, dyspnea or TIA's, no numbness, tingling or pain in extremities  Physical Exam: Blood pressure 122/70, pulse 68, temperature 97.9 F (36.6 C), temperature source Temporal, resp. rate 16, height 5\' 9"  (1.753 m), weight 290 lb (131.543 kg), SpO2 93 %. Body mass index is 42.81 kg/(m^2). General Appearance:  alert, oriented, no acute distress heart sounds regular rate and rhythm, S1, S2 normal, no murmur, click, rub or gallop, chest clear, no hepatosplenomegaly, no carotid bruits, feet: normal DP and PT pulses, no trophic changes or ulcerative lesions and normal sensory  exam supple and limited flexion, right rotation and left rotation, without spinous process tenderness, with paraspinal muscle tenderness both sides, normal sensation, reflexes, and pulses distal.  Labs: Lab Results  Component Value Date   HGBA1C 8.4* 10/06/2015    Lab Results  Component Value Date   MICROALBUR 3.6 10/06/2015    Lab Results  Component Value Date   CHOL 202* 10/06/2015   HDL 38* 10/06/2015   LDLCALC 127 10/06/2015   TRIG 186* 10/06/2015   CHOLHDL 5.3* 10/06/2015      Lab Results  Component Value Date   GFRNONAA >89 10/06/2015    Plan and Assessment: Diabetes Mellitus type 2:  Diabetes mellitus Type II, under poor control. without complications  Willing to try Farxiga 10mg , will give diflucan incase of yeast Education: Reviewed 'ABCs' of diabetes management (respective goals in parentheses):  A1C (<7), blood pressure (<130/80), and cholesterol (LDL <100)  Eye Exam yearly and Dental Exam every 6 months. Dietary recommendations Physical Activity recommendations   Blood pressure: normal blood pressure.   An ACE/ARB is not currently part of their treatment regimen.  LDL goal of < 100, HDL > 40 and TG < 150. Dyslipidemia under inadequate control. A statin is not currently part of their treatment regimen.

## 2015-11-30 ENCOUNTER — Telehealth: Payer: Self-pay | Admitting: Physician Assistant

## 2015-11-30 MED ORDER — PREDNISONE 20 MG PO TABS: ORAL_TABLET | ORAL | Status: DC

## 2015-11-30 MED ORDER — BENZONATATE 100 MG PO CAPS: 100.0000 mg | ORAL_CAPSULE | Freq: Three times a day (TID) | ORAL | Status: DC | PRN

## 2015-11-30 MED ORDER — AZITHROMYCIN 250 MG PO TABS: ORAL_TABLET | ORAL | Status: AC

## 2015-11-30 NOTE — Telephone Encounter (Signed)
Spoke with pt about Rx pick up.

## 2015-11-30 NOTE — Telephone Encounter (Signed)
Patient calling with chest congestion, sinus pressure/mucus, cough with green mucus, fever/chills x 1 week. Recently see 12/05 and has upcoming appointment in Jan.  Will send in zpak, prednisone and tessalon.  Come in sooner if gets worse or call if not getting better.

## 2015-12-20 ENCOUNTER — Ambulatory Visit: Payer: Self-pay | Admitting: Physician Assistant

## 2015-12-31 ENCOUNTER — Ambulatory Visit (INDEPENDENT_AMBULATORY_CARE_PROVIDER_SITE_OTHER): Payer: Managed Care, Other (non HMO) | Admitting: Physician Assistant

## 2015-12-31 VITALS — BP 130/78 | HR 88 | Temp 98.6°F | Resp 16 | Ht 69.0 in | Wt 284.8 lb

## 2015-12-31 DIAGNOSIS — R945 Abnormal results of liver function studies: Secondary | ICD-10-CM

## 2015-12-31 DIAGNOSIS — E119 Type 2 diabetes mellitus without complications: Secondary | ICD-10-CM | POA: Diagnosis not present

## 2015-12-31 DIAGNOSIS — R7989 Other specified abnormal findings of blood chemistry: Secondary | ICD-10-CM

## 2015-12-31 DIAGNOSIS — I10 Essential (primary) hypertension: Secondary | ICD-10-CM | POA: Diagnosis not present

## 2015-12-31 LAB — CBC WITH DIFFERENTIAL/PLATELET
BASOS ABS: 0.1 10*3/uL (ref 0.0–0.1)
Basophils Relative: 1 % (ref 0–1)
Eosinophils Absolute: 0.1 10*3/uL (ref 0.0–0.7)
Eosinophils Relative: 2 % (ref 0–5)
HEMATOCRIT: 46.7 % — AB (ref 36.0–46.0)
HEMOGLOBIN: 16.2 g/dL — AB (ref 12.0–15.0)
LYMPHS PCT: 55 % — AB (ref 12–46)
Lymphs Abs: 3 10*3/uL (ref 0.7–4.0)
MCH: 31.3 pg (ref 26.0–34.0)
MCHC: 34.7 g/dL (ref 30.0–36.0)
MCV: 90.2 fL (ref 78.0–100.0)
MONO ABS: 0.3 10*3/uL (ref 0.1–1.0)
MPV: 8.7 fL (ref 8.6–12.4)
Monocytes Relative: 5 % (ref 3–12)
NEUTROS ABS: 2 10*3/uL (ref 1.7–7.7)
Neutrophils Relative %: 37 % — ABNORMAL LOW (ref 43–77)
Platelets: 233 10*3/uL (ref 150–400)
RBC: 5.18 MIL/uL — AB (ref 3.87–5.11)
RDW: 13 % (ref 11.5–15.5)
WBC: 5.4 10*3/uL (ref 4.0–10.5)

## 2015-12-31 LAB — HEPATIC FUNCTION PANEL
ALK PHOS: 48 U/L (ref 33–130)
ALT: 111 U/L — AB (ref 6–29)
AST: 73 U/L — ABNORMAL HIGH (ref 10–35)
Albumin: 4.1 g/dL (ref 3.6–5.1)
BILIRUBIN INDIRECT: 0.6 mg/dL (ref 0.2–1.2)
Bilirubin, Direct: 0.1 mg/dL (ref <=0.2)
TOTAL PROTEIN: 7.4 g/dL (ref 6.1–8.1)
Total Bilirubin: 0.7 mg/dL (ref 0.2–1.2)

## 2015-12-31 LAB — BASIC METABOLIC PANEL WITH GFR
BUN: 15 mg/dL (ref 7–25)
CO2: 28 mmol/L (ref 20–31)
Calcium: 9.3 mg/dL (ref 8.6–10.4)
Chloride: 104 mmol/L (ref 98–110)
Creat: 0.83 mg/dL (ref 0.50–1.05)
GFR, EST NON AFRICAN AMERICAN: 82 mL/min (ref >=60)
GFR, Est African American: >89 mL/min (ref >=60)
GLUCOSE: 134 mg/dL — AB (ref 65–99)
POTASSIUM: 4.3 mmol/L (ref 3.5–5.3)
Sodium: 138 mmol/L (ref 135–146)

## 2015-12-31 LAB — HEPATITIS C ANTIBODY: HCV Ab: NEGATIVE

## 2015-12-31 MED ORDER — DAPAGLIFLOZIN PROPANEDIOL 10 MG PO TABS: 10.0000 mg | ORAL_TABLET | Freq: Every day | ORAL | Status: DC

## 2015-12-31 NOTE — Patient Instructions (Signed)
Diabetes is a very complicated disease...lets simplify it.  An easy way to look at it to understand the complications is if you think of the extra sugar floating in your blood stream as glass shards floating through your blood stream.    Diabetes affects your small vessels first: 1) The glass shards (sugar) scraps down the tiny blood vessels in your eyes and lead to diabetic retinopathy, the leading cause of blindness in the [place]. Diabetes is the leading cause of newly diagnosed adult (20 to 52 years of age) blindness in the [place].  2) The glass shards scratches down the tiny vessels of your legs leading to nerve damage called neuropathy and can lead to amputations of your feet. More than 60% of all non-traumatic amputations of lower limbs occur in people with diabetes.  3) Over time the small vessels in your brain are shredded and closed off, individually this does not cause any problems but over a long period of time many of the small vessels being blocked can lead to Vascular Dementia.   4) Your kidney's are a filter system and have a "net" that keeps certain things in the body and lets bad things out. Sugar shreds this net and leads to kidney damage and eventually failure. Decreasing the sugar that is destroying the net and certain blood pressure medications can help stop or decrease progression of kidney disease. Diabetes was the primary cause of kidney failure in 44 percent of all new cases in 2011.  5) Diabetes also destroys the small vessels in your penis that lead to erectile dysfunction. Eventually the vessels are so damaged that you may not be responsive to cialis or viagra.   Diabetes and your large vessels: Your larger vessels consist of your coronary arteries in your heart and the carotid vessels to your brain. Diabetes or even increased sugars put you at 300% increased risk of heart attack and stroke and this is why.. The sugar scrapes down your large blood vessels and your body  sees this as an internal injury and tries to repair itself. Just like you get a scab on your skin, your platelets will stick to the blood vessel wall trying to heal it. This is why we have diabetics on low dose aspirin daily, this prevents the platelets from sticking and can prevent plaque formation. In addition, your body takes cholesterol and tries to shove it into the open wound. This is why we want your LDL, or bad cholesterol, below 70.   The combination of platelets and cholesterol over 5-10 years forms plaque that can break off and cause a heart attack or stroke.   PLEASE REMEMBER:  Diabetes is preventable! Up to 85 percent of complications and morbidities among individuals with type 2 diabetes can be prevented, delayed, or effectively treated and minimized with regular visits to a health professional, appropriate monitoring and medication, and a healthy diet and lifestyle.  We want weight loss that will last so you should lose 1-2 pounds a week.  THAT IS IT! Please pick THREE things a month to change. Once it is a habit check off the item. Then pick another three items off the list to become habits.  If you are already doing a habit on the list GREAT!  Cross that item off! o Don't drink your calories. Ie, alcohol, soda, fruit juice, and sweet tea.  o Drink more water. Drink a glass when you feel hungry or before each meal.  o Eat breakfast - Complex carb   and protein (likeDannon light and fit yogurt, oatmeal, fruit, eggs, [place] bacon). o Measure your cereal.  Eat no more than one cup a day. (ie [place]) o Eat an apple a day. o Add a vegetable a day. o Try a new vegetable a month. o Use Pam! Stop using oil or butter to cook. o Don't finish your plate or use smaller plates. o Share your dessert. o Eat sugar free Jello for dessert or frozen grapes. o Don't eat 2-3 hours before bed. o Switch to whole wheat bread, pasta, and brown rice. o Make healthier choices when you eat out. No  fries! o Pick baked chicken, NOT fried. o Don't forget to SLOW DOWN when you eat. It is not going anywhere.  o Take the stairs. o Park far away in the parking lot o [employer] (or weights) for 10 minutes while watching TV. o Walk at work for 10 minutes during break. o Walk outside 1 time a week with your friend, kids, dog, or significant other. o Start a walking group at [place] the mall as much as you can tolerate.  o Keep a food diary. o Weigh yourself daily. o Walk for 15 minutes 3 days per week. o Cook at home more often and eat out less.  If life happens and you go back to old habits, it is okay.  Just start over. You can do it!   If you experience chest pain, get short of breath, or tired during the exercise, please stop immediately and inform your doctor.   Before you even begin to attack a weight-loss plan, it pays to remember this: You are not fat. You have fat. Losing weight isn't about blame or shame; it's simply another achievement to accomplish. Dieting is like any other skill-you have to buckle down and work at it. As long as you act in a smart, reasonable way, you'll ultimately get where you want to be. Here are some weight loss pearls for you.  1. It's Not a Diet. It's a Lifestyle Thinking of a diet as something you're on and suffering through only for the short term doesn't work. To shed weight and keep it off, you need to make permanent changes to the way you eat. It's OK to indulge occasionally, of course, but if you cut calories temporarily and then revert to your old way of eating, you'll gain back the weight quicker than you can say yo-yo. Use it to lose it. Research shows that one of the best predictors of long-term weight loss is how many pounds you drop in the first month. For that reason, nutritionists often suggest being stricter for the first two weeks of your new eating strategy to build momentum. Cut out added sugar and alcohol and avoid unrefined carbs.  After that, figure out how you can reincorporate them in a way that's healthy and maintainable.  2. There's a Right Way to Exercise Working out burns calories and fat and boosts your metabolism by building muscle. But those trying to lose weight are notorious for overestimating the number of calories they burn and underestimating the amount they take in. Unfortunately, your system is biologically programmed to hold on to extra pounds and that means when you start exercising, your body senses the deficit and ramps up its hunger signals. If you're not diligent, you'll eat everything you burn and then some. Use it to lose it. Cardio gets all the exercise glory, but strength and interval training are the  real heroes. They help you build lean muscle, which in turn increases your metabolism and calorie-burning ability 3. Don't Overreact to Mild Hunger Some people have a hard time losing weight because of hunger anxiety. To them, being hungry is bad-something to be avoided at all costs-so they carry snacks with them and eat when they don't need to. Others eat because they're stressed out or bored. While you never want to get to the point of being ravenous (that's when bingeing is likely to happen), a hunger pang, a craving, or the fact that it's 3:00 p.m. should not send you racing for the vending machine or obsessing about the energy bar in your purse. Ideally, you should put off eating until your stomach is growling and it's difficult to concentrate.  Use it to lose it. When you feel the urge to eat, use the HALT method. Ask yourself, Am I really hungry? Or am I angry or anxious, lonely or bored, or tired? If you're still not certain, try the apple test. If you're truly hungry, an apple should seem delicious; if it doesn't, something else is going on. Or you can try drinking water and making yourself busy, if you are still hungry try a healthy snack.  4. Not All Calories Are Created Equal The mechanics of weight  loss are pretty simple: Take in fewer calories than you use for energy. But the kind of food you eat makes all the difference. Processed food that's high in saturated fat and refined starch or sugar can cause inflammation that disrupts the hormone signals that tell your brain you're full. The result: You eat a lot more.  Use it to lose it. Clean up your diet. Swap in whole, unprocessed foods, including vegetables, lean protein, and healthy fats that will fill you up and give you the biggest nutritional bang for your calorie buck. In a few weeks, as your brain starts receiving regular hunger and fullness signals once again, you'll notice that you feel less hungry overall and naturally start cutting back on the amount you eat.  5. Protein, Produce, and Plant-Based Fats Are Your Weight-Loss Trinity Here's why eating the three Ps regularly will help you drop pounds. Protein fills you up. You need it to build lean muscle, which keeps your metabolism humming so that you can torch more fat. People in a weight-loss program who ate double the recommended daily allowance for protein (about 110 grams for a 150-pound woman) lost 70 percent of their weight from fat, while people who ate the RDA lost only about 40 percent, one study found. Produce is packed with filling fiber. "It's very difficult to consume too many calories if you're eating a lot of vegetables. Example: Three cups of broccoli is a lot of food, yet only 93 calories. (Fruit is another story. It can be easy to overeat and can contain a lot of calories from sugar, so be sure to monitor your intake.) Plant-based fats like olive oil and those in avocados and nuts are healthy and extra satiating.  Use it to lose it. Aim to incorporate each of the three Ps into every meal and snack. People who eat protein throughout the day are able to keep weight off, according to a study in the [place] of Clinical Nutrition. In addition to meat, poultry and seafood,  good sources are beans, lentils, eggs, tofu, and yogurt. As for fat, keep portion sizes in check by measuring out salad dressing, oil, and nut butters (shoot for one to  two tablespoons). Finally, eat veggies or a little fruit at every meal. People who did that consumed 308 fewer calories but didn't feel any hungrier than when they didn't eat more produce.  7. How You Eat Is As Important As What You Eat In order for your brain to register that you're full, you need to focus on what you're eating. Sit down whenever you eat, preferably at a table. Turn off the TV or computer, put down your phone, and look at your food. Smell it. Chew slowly, and don't put another bite on your fork until you swallow. When women ate lunch this attentively, they consumed 30 percent less when snacking later than those who listened to an audiobook at lunchtime, according to a study in the [place] of Nutrition. 8. Weighing Yourself Really Works The scale provides the best evidence about whether your efforts are paying off. Seeing the numbers tick up or down or stagnate is motivation to keep going-or to rethink your approach. A 2015 study at [facility] found that daily weigh-ins helped people lose more weight, keep it off, and maintain that loss, even after two years. Use it to lose it. Step on the scale at the same time every day for the best results. If your weight shoots up several pounds from one weigh-in to the next, don't freak out. Eating a lot of salt the night before or having your period is the likely culprit. The number should return to normal in a day or two. It's a steady climb that you need to do something about. 9. Too Much Stress and Too Little Sleep Are Your Enemies When you're tired and frazzled, your body cranks up the production of cortisol, the stress hormone that can cause carb cravings. Not getting enough sleep also boosts your levels of ghrelin, a hormone associated with hunger, while suppressing  leptin, a hormone that signals fullness and satiety. People on a diet who slept only five and a half hours a night for two weeks lost 55 percent less fat and were hungrier than those who slept eight and a half hours, according to a study in the [place]. Use it to lose it. Prioritize sleep, aiming for seven hours or more a night, which research shows helps lower stress. And make sure you're getting quality zzz's. If a snoring spouse or a fidgety cat wakes you up frequently throughout the night, you may end up getting the equivalent of just four hours of sleep, according to a study from [facility]. Keep pets out of the bedroom, and use a white-noise app to drown out snoring. 10. You Will Hit a plateau-And You Can Bust Through It As you slim down, your body releases much less leptin, the fullness hormone.  If you're not strength training, start right now. Building muscle can raise your metabolism to help you overcome a plateau. To keep your body challenged and burning calories, incorporate new moves and more intense intervals into your workouts or add another sweat session to your weekly routine. Alternatively, cut an extra 100 calories or so a day from your diet. Now that you've lost weight, your body simply doesn't need as much fuel.

## 2015-12-31 NOTE — Progress Notes (Signed)
Assessment and Plan: 1. Diabetes mellitus without complication ([place]) Continue farxiga, declines ACE at this time, continue bASA, work on decreasing carbs/diet Follow up 2 months  2. Essential hypertension - CBC with Differential/Platelet - BASIC METABOLIC PANEL WITH GFR  3. Morbid obesity, unspecified obesity type ([place]) Obesity with co morbidities- long discussion about weight loss, diet, and exercise  4. Elevated LFTs Check labs, avoid tylenol, alcohol, weight loss advised.  - Hepatic function panel - Hepatitis C antibody  Future Appointments Date Time Provider [place]  10/05/2016 10:00 AM Vicie Mutters, PA-C [id number] None    HPI 50 y.o.female presents for follow up for farxiga start for DM. She also had URI, treated with zpak recently, she is feeling better but continues to have some right sided congestion, still on zyrtec/flonase. She is handling the farxiga well, no yeast, no AEs and has lost 5 lbs.  She is morbidly obese and had elevated liver functions last visit, will repeat with Hepatitis C screening.    Lab Results  Component Value Date   HGBA1C 8.4* 10/06/2015   BP: 130/78 mmHg   Wt Readings from Last 3 Encounters:  12/31/15 258 lb (117.028 kg)  11/15/15 290 lb (131.543 kg)  10/29/15 291 lb (131.997 kg)   Past Medical History  Diagnosis Date  . Hyperlipidemia   . Vitamin D deficiency   . Hypertension   . Prediabetes   . Obesity     BMI 41  . Pulmonary embolism ([place]) 2006    from BCP  . Hypothyroidism   . Fibrocystic breast   . Iron deficiency anemia      Allergies  Allergen Reactions  . Crestor [Rosuvastatin]     Hair Loss   . Penicillins Rash      Current Outpatient Prescriptions on File Prior to Visit  Medication Sig Dispense Refill  . cyclobenzaprine (FLEXERIL) 10 MG tablet Take 1 tablet (10 mg total) by mouth at bedtime. 60 tablet 0  . dapagliflozin propanediol (FARXIGA) 10 MG TABS tablet Take 10 mg by mouth daily. 30 tablet 3   . nystatin (MYCOSTATIN) 100000 UNIT/ML suspension Take 5 mLs (500,000 Units total) by mouth 4 (four) times daily. Swish and swallow 240 mL 0   No current facility-administered medications on file prior to visit.    ROS: all negative except above.   Physical Exam: Filed Weights   12/31/15 0902  Weight: 258 lb (117.028 kg)   BP 130/78 mmHg  Pulse 88  Temp(Src) 98.6 F (37 C) (Temporal)  Resp 16  Ht 5\' 9"  (1.753 m)  Wt 258 lb (117.028 kg)  BMI 38.08 kg/m2  SpO2 95%  LMP  General Appearance: Well nourished, in no apparent distress. Eyes: PERRLA, EOMs, conjunctiva no swelling or erythema Sinuses: No Frontal/maxillary tenderness ENT/Mouth: Ext aud canals clear, TMs without erythema, bulging. No erythema, swelling, or exudate on post pharynx.  Tonsils not swollen or erythematous. Hearing normal.  Neck: Supple, thyroid normal.  Respiratory: Respiratory effort normal, BS equal bilaterally without rales, rhonchi, wheezing or stridor.  Cardio: RRR with no MRGs. Brisk peripheral pulses without edema.  Abdomen: Soft, + BS.  Non tender, no guarding, rebound, hernias, masses. Lymphatics: Non tender without lymphadenopathy.  Musculoskeletal: Full ROM, 5/5 strength, normal gait.  Skin: Warm, dry without rashes, lesions, ecchymosis.  Neuro: Cranial nerves intact. Normal muscle tone, no cerebellar symptoms. Sensation intact.  Psych: Awake and oriented X 3, normal affect, Insight and Judgment appropriate.     Vicie Mutters, PA-C 9:15 AM [facility]  

## 2016-01-04 NOTE — Addendum Note (Signed)
Addended by: Vicie Mutters R on: 01/04/2016 07:23 AM   Modules accepted: Orders

## 2016-01-11 ENCOUNTER — Ambulatory Visit
Admission: RE | Admit: 2016-01-11 | Discharge: 2016-01-11 | Disposition: A | Discharge status: Home / Self Care | Payer: Self-pay | Source: Ambulatory Visit | Attending: Physician Assistant | Admitting: Physician Assistant

## 2016-01-11 DIAGNOSIS — R945 Abnormal results of liver function studies: Secondary | ICD-10-CM

## 2016-01-11 DIAGNOSIS — R7989 Other specified abnormal findings of blood chemistry: Secondary | ICD-10-CM

## 2016-01-12 ENCOUNTER — Ambulatory Visit: Payer: Self-pay | Admitting: Physician Assistant

## 2016-02-28 ENCOUNTER — Ambulatory Visit: Payer: Self-pay | Admitting: Physician Assistant

## 2016-03-23 ENCOUNTER — Ambulatory Visit: Payer: Self-pay | Admitting: Physician Assistant

## 2016-06-09 ENCOUNTER — Other Ambulatory Visit: Payer: Self-pay | Admitting: Physician Assistant

## 2016-06-09 ENCOUNTER — Telehealth: Payer: Self-pay

## 2016-06-09 MED ORDER — AZITHROMYCIN 250 MG PO TABS: ORAL_TABLET | ORAL | Status: AC

## 2016-06-09 NOTE — Telephone Encounter (Signed)
LVM informing pt of Rx sent & that she needed to come for an office visit.

## 2016-10-05 ENCOUNTER — Encounter: Payer: Self-pay | Admitting: Physician Assistant

## 2016-10-05 ENCOUNTER — Ambulatory Visit (INDEPENDENT_AMBULATORY_CARE_PROVIDER_SITE_OTHER): Payer: Managed Care, Other (non HMO) | Admitting: Physician Assistant

## 2016-10-05 VITALS — BP 126/70 | HR 93 | Temp 97.1°F | Resp 14 | Ht 69.0 in | Wt 285.8 lb

## 2016-10-05 DIAGNOSIS — E559 Vitamin D deficiency, unspecified: Secondary | ICD-10-CM | POA: Diagnosis not present

## 2016-10-05 DIAGNOSIS — Z136 Encounter for screening for cardiovascular disorders: Secondary | ICD-10-CM | POA: Diagnosis not present

## 2016-10-05 DIAGNOSIS — E039 Hypothyroidism, unspecified: Secondary | ICD-10-CM | POA: Diagnosis not present

## 2016-10-05 DIAGNOSIS — D509 Iron deficiency anemia, unspecified: Secondary | ICD-10-CM

## 2016-10-05 DIAGNOSIS — Z0001 Encounter for general adult medical examination with abnormal findings: Secondary | ICD-10-CM

## 2016-10-05 DIAGNOSIS — E119 Type 2 diabetes mellitus without complications: Secondary | ICD-10-CM

## 2016-10-05 DIAGNOSIS — R6889 Other general symptoms and signs: Secondary | ICD-10-CM

## 2016-10-05 DIAGNOSIS — N6019 Diffuse cystic mastopathy of unspecified breast: Secondary | ICD-10-CM

## 2016-10-05 DIAGNOSIS — I10 Essential (primary) hypertension: Secondary | ICD-10-CM

## 2016-10-05 DIAGNOSIS — E785 Hyperlipidemia, unspecified: Secondary | ICD-10-CM

## 2016-10-05 LAB — CBC WITH DIFFERENTIAL/PLATELET
BASOS PCT: 0 %
Basophils Absolute: 0 cells/uL (ref 0–200)
EOS PCT: 2 %
Eosinophils Absolute: 122 cells/uL (ref 15–500)
HCT: 45.1 % — ABNORMAL HIGH (ref 35.0–45.0)
Hemoglobin: 15.7 g/dL — ABNORMAL HIGH (ref 11.7–15.5)
LYMPHS PCT: 55 %
Lymphs Abs: 3355 cells/uL (ref 850–3900)
MCH: 31.8 pg (ref 27.0–33.0)
MCHC: 34.8 g/dL (ref 32.0–36.0)
MCV: 91.3 fL (ref 80.0–100.0)
MONOS PCT: 6 %
MPV: 8.6 fL (ref 7.5–12.5)
Monocytes Absolute: 366 cells/uL (ref 200–950)
NEUTROS ABS: 2257 {cells}/uL (ref 1500–7800)
Neutrophils Relative %: 37 %
PLATELETS: 244 10*3/uL (ref 140–400)
RBC: 4.94 MIL/uL (ref 3.80–5.10)
RDW: 13 % (ref 11.0–15.0)
WBC: 6.1 10*3/uL (ref 3.8–10.8)

## 2016-10-05 LAB — HEMOGLOBIN A1C
HEMOGLOBIN A1C: 8.1 % — AB (ref <5.7)
Mean Plasma Glucose: 186 mg/dL

## 2016-10-05 MED ORDER — PHENTERMINE HCL 37.5 MG PO CAPS: 37.5000 mg | ORAL_CAPSULE | ORAL | 0 refills | Status: DC

## 2016-10-05 NOTE — Patient Instructions (Signed)
Add ENTERIC COATED low dose 81 mg Aspirin daily OR can do every other day if you have easy bruising to protect your heart and head. As well as to reduce risk of Colon Cancer by 20 %, Skin Cancer by 26 % , Melanoma by 46% and Pancreatic cancer by 60%  Who Qualifies for Obesity Medications? Although everyone is hopeful for a fast and easy way to lose weight, nothing has been shown to replace a prudent, calorie-controlled diet along with behavior modification as a cornerstone for all obesity treatments.  The next tool that can be used to achieve weight-loss and health improvement is medication. Pharmacotherapy may be offered to individuals affected by obesity who have failed to achieve weight-loss through diet and exercise alone. Currently there are several drugs that are approved by the FDA for weight-loss: phentermine products (Adipex-P or Suprenza)  lorcaserin HCI (Belviq) phentermine- topiramate ER (Qsymia)  Bupropion; Naltrexone ER (Contrave)  Let's take a closer look at each of these medications and learn how they work:  Phentermine (Adipex-P or Suprenza) How does it work? Phentermine is a medication available by prescription that works on chemicals in the brain to decrease your appetite. It also has a mild stimulant component that adds extra energy. Phentermine is a pill that is taken once a day in the morning time. Tolerance to this medication can develop, so it can only be used for several months at a time. Common side effects are dry mouth, sleeplessness, constipation. Weight-loss: The average weight-loss is 4-5 percent of your weight after one-year. In a 200 pound person, this means about 10 pounds of weight-loss. Patients who receive phentermine can usually expect to see greater weight-loss than those who receive non-pharmacologic care, on average about 13 pounds difference over 12 weeks as reported in one study. Concerns: Due to its stimulant effect, a person's blood pressure and  heart rate may increase when on this medication; therefore, you must be monitored closely by a physician who is experienced in prescribing this medication. It cannot be used in patients with some heart conditions (such as poorly controlled blood pressure), glaucoma (increased pressure in your eye), stroke or overactive thyroid. There is some concern for abuse, but this is minimal if the medication is appropriately used as directed by a healthcare professional.  Lorcaserin (Belviq) How does it work? Lorcaserin was approved in June 2012 by the FDA and became commercially available in June 2013. It works by helping you feel full while eating less, and it works on the chemicals in your brain to help decrease your appetite. Weight-loss: In individuals who took the medication for one-year, it has been shown to have an average of 7 percent weight-loss. In a 200 pound person, this would mean a 14 pound weight-loss. Blood sugar, cholesterol and blood pressure levels have also been shown to improve. Concerns: The most common side effects are headache, dizziness, fatigue, dry mouth, upper [id number] tract infection and nausea.  Response to therapy should be evaluated by week 12.  If a patient has not lost at least 5% of baseline body weigh  Phentermine-Topiramate ER (Qsymia) How does it work? This combination medication was approved by the FDA in July 2012. Topiramate is a medication used to treat seizures. It was found that a common side effect of this medication was weight-loss. Phentermine, as described in this brochure, helps to increase your energy and decrease your appetite. Weight-loss: Among individuals who took the highest does of Qsymia (15 mg phentermine and 92 mg of topiramate  ER) for one-year, they achieved an average of 14.4 percent weight-loss. In a 200 pound person, a 14.4 percent weight-loss would mean a loss of 29 pounds. Cholesterol levels have also been shown to improve. Concerns: The  most common side effects were dry mouth, constipation and pins-and-needle feeling in extremities. Qsymia should NOT be taken during pregnancy since Topiramate ER, a component of Qsymia, has been associated with an increased risk of birth defects.  Bupropion; Naltrexone ER (Contrave) How does it work? Works in two areas of your brain, hunger center and reward center to reduce hunger and cravings.  Weight loss In a 56 week trial patients lost more than 5% of their body weight.  Concerns Most common side effects are dry mouth, constipation or diarrhea, headache.  Please take it with a full glass of water and low fat meal.    Follow-up Visits: Patients are given the opportunity to revisit a topic or obtain more information on an area of interest during follow-up visits. The frequency of and interval between follow-up visits is determined on a patient-by-patient basis. Frequent visits (every 3 to 4 weeks) are encouraged until initial weight-loss goals (5 to 10 percent of body weight) are achieved. At that point, less frequent visits are typically scheduled as needed for individual patients. However, since obesity is considered a chronic life-long problem for many individuals, periodic continual follow up is recommended.   Research has shown that weight-loss as low as 5 percent of initial body weight can lead to favorable improvements in blood pressure, cholesterol, glucose levels and insulin sensitivity. The risk of developing heart disease is reduced the most in patients who have impaired glucose tolerance, type 2 diabetes or high blood pressure.     Diabetes is a very complicated disease...lets simplify it.  An easy way to look at it to understand the complications is if you think of the extra sugar floating in your blood stream as glass shards floating through your blood stream.    Diabetes affects your small vessels first: 1) The glass shards (sugar) scraps down the tiny blood vessels in  your eyes and lead to diabetic retinopathy, the leading cause of blindness in the [place]. Diabetes is the leading cause of newly diagnosed adult (54 to 52 years of age) blindness in the [place].  2) The glass shards scratches down the tiny vessels of your legs leading to nerve damage called neuropathy and can lead to amputations of your feet. More than 60% of all non-traumatic amputations of lower limbs occur in people with diabetes.  3) Over time the small vessels in your brain are shredded and closed off, individually this does not cause any problems but over a long period of time many of the small vessels being blocked can lead to Vascular Dementia.   4) Your kidney's are a filter system and have a "net" that keeps certain things in the body and lets bad things out. Sugar shreds this net and leads to kidney damage and eventually failure. Decreasing the sugar that is destroying the net and certain blood pressure medications can help stop or decrease progression of kidney disease. Diabetes was the primary cause of kidney failure in 44 percent of all new cases in 2011.  5) Diabetes also destroys the small vessels in your penis that lead to erectile dysfunction. Eventually the vessels are so damaged that you may not be responsive to cialis or viagra.   Diabetes and your large vessels: Your larger vessels consist of your coronary  arteries in your heart and the carotid vessels to your brain. Diabetes or even increased sugars put you at 300% increased risk of heart attack and stroke and this is why.. The sugar scrapes down your large blood vessels and your body sees this as an internal injury and tries to repair itself. Just like you get a scab on your skin, your platelets will stick to the blood vessel wall trying to heal it. This is why we have diabetics on low dose aspirin daily, this prevents the platelets from sticking and can prevent plaque formation. In addition, your body takes cholesterol and  tries to shove it into the open wound. This is why we want your LDL, or bad cholesterol, below 70.   The combination of platelets and cholesterol over 5-10 years forms plaque that can break off and cause a heart attack or stroke.   PLEASE REMEMBER:  Diabetes is preventable! Up to 25 percent of complications and morbidities among individuals with type 2 diabetes can be prevented, delayed, or effectively treated and minimized with regular visits to a health professional, appropriate monitoring and medication, and a healthy diet and lifestyle.     Bad carbs also include fruit juice, alcohol, and sweet tea. These are empty calories that do not signal to your brain that you are full.   Please remember the good carbs are still carbs which convert into sugar. So please measure them out no more than 1/2-1 cup of rice, oatmeal, pasta, and beans  Veggies are however free foods! Pile them on.   Not all fruit is created equal. Please see the list below, the fruit at the bottom is higher in sugars than the fruit at the top. Please avoid all dried fruits.   Phentermine  While taking the medication we may ask that you come into the office once a month or once every 2-3 months to monitor your weight, blood pressure, and heart rate. In addition we can help answer your questions about diet, exercise, and help you every step of the way with your weight loss journey. Sometime it is helpful if you bring in a food diary or use an app on your phone such as myfitnesspal to record your calorie intake, especially in the beginning.   You can start out on 1/3 to 1/2 a pill in the morning and if you are tolerating it well you can increase to one pill daily. I also have some patients that take 1/3 or 1/2 at lunch to help prevent night time eating.  This medication is cheapest CASH pay at [facility] is 14-17 dollars and you do NOT need a membership to get meds from there.    What is this  medicine? PHENTERMINE (FEN ter meen) decreases your appetite. This medicine is intended to be used in addition to a healthy reduced calorie diet and exercise. The best results are achieved this way. This medicine is only indicated for short-term use. Eventually your weight loss may level out and the medication will no longer be needed.   How should I use this medicine? Take this medicine by mouth. Follow the directions on the prescription label. The tablets should stay in the bottle until immediately before you take your dose. Take your doses at regular intervals. Do not take your medicine more often than directed.  Overdosage: If you think you have taken too much of this medicine contact a poison control center or emergency room at once. NOTE: This medicine is  only for you. Do not share this medicine with others.  What if I miss a dose? If you miss a dose, take it as soon as you can. If it is almost time for your next dose, take only that dose. Do not take double or extra doses. Do not increase or in any way change your dose without consulting your doctor.  What should I watch for while using this medicine? Notify your physician immediately if you become short of breath while doing your normal activities. Do not take this medicine within 6 hours of bedtime. It can keep you from getting to sleep. Avoid drinks that contain caffeine and try to stick to a regular bedtime every night. Do not stand or sit up quickly, especially if you are an older patient. This reduces the risk of dizzy or fainting spells. Avoid alcoholic drinks.  What side effects may I notice from receiving this medicine? Side effects that you should report to your doctor or health care professional as soon as possible: -chest pain, palpitations -depression or severe changes in mood -increased blood pressure -irritability -nervousness or restlessness -severe dizziness -shortness of breath -problems urinating -unusual swelling  of the legs -vomiting  Side effects that usually do not require medical attention (report to your doctor or health care professional if they continue or are bothersome): -blurred vision or other eye problems -changes in sexual ability or desire -constipation or diarrhea -difficulty sleeping -dry mouth or unpleasant taste -headache -nausea This list may not describe all possible side effects. Call your doctor for medical advice about side effects. You may report side effects to FDA at 1-800-FDA-1088.  I think it is possible that you have sleep apnea. It can cause interrupted sleep, headaches, frequent awakenings, fatigue, dry mouth, fast/slow heart beats, memory issues, anxiety/depression, swelling, numbness tingling hands/feet, weight gain, shortness of breath, and the list goes on. Sleep apnea needs to be ruled out because if it is left untreated it does eventually lead to abnormal heart beats, lung failure or heart failure as well as increasing the risk of heart attack and stroke. There are masks you can wear OR a mouth piece that I can give you information about. Often times though people feel MUCH better after getting treatment.   Sleep Apnea  Sleep apnea is a sleep disorder characterized by abnormal pauses in breathing while you sleep. When your breathing pauses, the level of oxygen in your blood decreases. This causes you to move out of deep sleep and into light sleep. As a result, your quality of sleep is poor, and the system that carries your blood throughout your body (cardiovascular system) experiences stress. If sleep apnea remains untreated, the following conditions can develop:  High blood pressure (hypertension).  Coronary artery disease.  Inability to achieve or maintain an erection (impotence).  Impairment of your thought process (cognitive dysfunction). There are three types of sleep apnea: 1. Obstructive sleep apnea--Pauses in breathing during sleep because of a blocked  airway. 2. Central sleep apnea--Pauses in breathing during sleep because the area of the brain that controls your breathing does not send the correct signals to the muscles that control breathing. 3. Mixed sleep apnea--A combination of both obstructive and central sleep apnea.  RISK FACTORS The following risk factors can increase your risk of developing sleep apnea:  Being overweight.  Smoking.  Having narrow passages in your nose and throat.  Being of older age.  Being female.  Alcohol use.  Sedative and tranquilizer use.  Ethnicity.  Among individuals younger than 35 years, African Americans are at increased risk of sleep apnea. SYMPTOMS   Difficulty staying asleep.  Daytime sleepiness and fatigue.  Loss of energy.  Irritability.  Loud, heavy snoring.  Morning headaches.  Trouble concentrating.  Forgetfulness.  Decreased interest in sex. DIAGNOSIS  In order to diagnose sleep apnea, your caregiver will perform a physical examination. Your caregiver may suggest that you take a home sleep test. Your caregiver may also recommend that you spend the night in a sleep lab. In the sleep lab, several monitors record information about your heart, lungs, and brain while you sleep. Your leg and arm movements and blood oxygen level are also recorded. TREATMENT The following actions may help to resolve mild sleep apnea:  Sleeping on your side.   Using a decongestant if you have nasal congestion.   Avoiding the use of depressants, including alcohol, sedatives, and narcotics.   Losing weight and modifying your diet if you are overweight. There also are devices and treatments to help open your airway:  Oral appliances. These are custom-made mouthpieces that shift your lower jaw forward and slightly open your bite. This opens your airway.  Devices that create positive airway pressure. This positive pressure "splints" your airway open to help you breathe better during sleep. The  following devices create positive airway pressure:  Continuous positive airway pressure (CPAP) device. The CPAP device creates a continuous level of air pressure with an air pump. The air is delivered to your airway through a mask while you sleep. This continuous pressure keeps your airway open.  Nasal expiratory positive airway pressure (EPAP) device. The EPAP device creates positive air pressure as you exhale. The device consists of single-use valves, which are inserted into each nostril and held in place by adhesive. The valves create very little resistance when you inhale but create much more resistance when you exhale. That increased resistance creates the positive airway pressure. This positive pressure while you exhale keeps your airway open, making it easier to breath when you inhale again.  Bilevel positive airway pressure (BPAP) device. The BPAP device is used mainly in patients with central sleep apnea. This device is similar to the CPAP device because it also uses an air pump to deliver continuous air pressure through a mask. However, with the BPAP machine, the pressure is set at two different levels. The pressure when you exhale is lower than the pressure when you inhale.  Surgery. Typically, surgery is only done if you cannot comply with less invasive treatments or if the less invasive treatments do not improve your condition. Surgery involves removing excess tissue in your airway to create a wider passage way. Document Released: 11/17/2002 Document Revised: 03/24/2013 Document Reviewed: 04/04/2012 [facility] Patient Information 2015 [place], [place]. This information is not intended to replace advice given to you by your health care provider. Make sure you discuss any questions you have with your health care provider.

## 2016-10-05 NOTE — Progress Notes (Signed)
Complete Physical  Assessment and Plan: Essential hypertension - continue medications, DASH diet, exercise and monitor at home. Call if greater than 130/80.  - CBC with Differential/Platelet - BASIC METABOLIC PANEL WITH GFR - Hepatic function panel - EKG 12-Lead   Hyperlipidemia  check lipids, decrease fatty foods, increase activity.  - Lipid panel  Diabetes mellitus without complication ([place]) Will add on bASA Declines medications at this time Follow up 3 months due to DM - Hemoglobin A1c - Urinalysis, Routine w reflex microscopic (not at [facility]) - Microalbumin / creatinine urine ratio - EKG 12-Lead  Hypothyroidism, unspecified hypothyroidism type Off medication, will monitor labs.  - TSH  Morbid obesity, unspecified obesity type ([place]) Obesity with co morbidities- long discussion about weight loss, diet, and exercise Will add on phentermine, 1 month follow up obesity/DM   Vitamin D deficiency - Vit D  25 hydroxy (rtn osteoporosis monitoring)  Fibrocystic breast, unspecified laterality GET MGM, will follow up with Dr. Philis Pique, has appt Dec. Declines breast exam/PAP today.    Anemia, iron deficiency - Iron and TIBC - Ferritin - Vitamin B12  Encounter for general adult medical examination with abnormal findings - CBC with Differential/Platelet - BASIC METABOLIC PANEL WITH GFR - Hepatic function panel - TSH - Lipid panel - Hemoglobin A1c - Magnesium - Vit D  25 hydroxy (rtn osteoporosis monitoring) - Urinalysis, Routine w reflex microscopic (not at [facility]) - Microalbumin / creatinine urine ratio - Iron and TIBC - Ferritin - Vitamin B12 - EKG 12-Lead  Colonoscopy-   patient declines a colonoscopy even though the risks and benefits were discussed at length. Colon cancer is 3rd most diagnosed cancer and 2nd leading cause of death in both men and women 60 years of age and older. Patient understands the risk of cancer and death with declining the test, states will get  next year   Discussed med's effects and SE's. Screening labs and tests as requested with regular follow-up as recommended.  HPI  50 y.o. female  presents for a complete physical.  Her blood pressure has been controlled at home, today their BP is BP: 126/70  She does workout, she is walking at work. She denies chest pain, shortness of breath, dizziness.  She is not on cholesterol medication and denies myalgias. Her cholesterol is at goal. The cholesterol last visit was:  Lab Results  Component Value Date   CHOL 202 (H) 10/06/2015   HDL 38 (L) 10/06/2015   LDLCALC 127 10/06/2015   TRIG 186 (H) 10/06/2015   CHOLHDL 5.3 (H) 10/06/2015    She has been working on diet and exercise for prediabetes but has been in the DM range, she is not on ASA, not on ACE/ARB due to hypotension, she is off farxiga and denies paresthesia of the feet, polydipsia, polyuria and visual disturbances. Last A1C in the office was: Lab Results  Component Value Date   HGBA1C 8.4 (H) 10/06/2015   Lab Results  Component Value Date   GFRNONAA 82 12/31/2015   Patient is on Vitamin D supplement.   Lab Results  Component Value Date   VD25OH 45 10/06/2015   BMI is Body mass index is 42.21 kg/m., her weight is up again. She will drink diet pepsi but drinking more water. Work has been stressful, still working at [employer].  Wt Readings from Last 3 Encounters:  10/05/16 285 lb 12.8 oz (129.6 kg)  12/31/15 284 lb 12.8 oz (129.2 kg)  11/15/15 290 lb (131.5 kg)  She is not on thyroid medication. Her medication was not changed last visit.   Lab Results  Component Value Date   TSH 2.768 10/06/2015   Current Medications:  Current Outpatient Prescriptions on File Prior to Visit  Medication Sig  . cyclobenzaprine (FLEXERIL) 10 MG tablet Take 1 tablet (10 mg total) by mouth at bedtime.   No current facility-administered medications on file prior to visit.     Health Maintenance:   Immunization History   Administered Date(s) Administered  . Pneumococcal-Unspecified 12/12/2003  . Tdap 06/10/2012   Tetanus: 2013 Pneumovax: 2005 Prevnar 13: NA Flu vaccine: declines Zostavax: NA  LMP: No LMP recorded. Patient is postmenopausal.  Pap: follows with Philis Pique 2013, DUE has appt Dec 21 MGM: 06/2012 suggest 3 D MGM  DUE has appt Dec 21st DEXA: N/A Colonoscopy: DUE- but would prefer to wait until next year, declines hemoccult cards EGD:  N/A Last Dental Exam: Dental works Last Eye Exam: 4 years ago but states no issues at this time  Patient Care Team: Unk Pinto, MD as PCP - General (Internal Medicine)  Medical History:  Past Medical History:  Diagnosis Date  . Fibrocystic breast   . Hyperlipidemia   . Hypertension   . Hypothyroidism   . Iron deficiency anemia   . Obesity    BMI 41  . Prediabetes   . Pulmonary embolism ([place]) 2006   from BCP  . Vitamin D deficiency    Allergies Allergies  Allergen Reactions  . Crestor [Rosuvastatin]     Hair Loss   . Penicillins Rash    SURGICAL HISTORY She  has a past surgical history that includes Tubal ligation (Bilateral); Tonsillectomy and adenoidectomy; Ablation (2006); and Thyroid surgery.   FAMILY HISTORY Her family history includes Cancer in her mother; Diabetes in her father; Osteoporosis in her mother.  SOCIAL HISTORY She  reports that she has never smoked. She has never used smokeless tobacco. She reports that she does not drink alcohol or use drugs.  Review of Systems  Constitutional: Positive for malaise/fatigue. Negative for chills, diaphoresis, fever and weight loss.  HENT: Negative.        + snoring, declines sleep study at this time due to cost  Eyes: Negative.  Negative for blurred vision.  Respiratory: Negative.  Negative for cough, hemoptysis, shortness of breath and wheezing.   Cardiovascular: Negative for chest pain, palpitations, orthopnea, claudication, leg swelling and PND.  Gastrointestinal: Positive  for heartburn (depending on what she eats). Negative for abdominal pain, blood in stool, constipation, diarrhea, melena, nausea and vomiting.  Genitourinary: Negative.   Musculoskeletal: Negative.  Negative for falls.  Skin: Negative.   Neurological: Negative.  Negative for weakness.  Psychiatric/Behavioral: Negative.  Negative for depression and suicidal ideas. The patient does not have insomnia.     Physical Exam: Estimated body mass index is 42.21 kg/m as calculated from the following:   Height as of this encounter: 5\' 9"  (1.753 m).   Weight as of this encounter: 285 lb 12.8 oz (129.6 kg). BP 126/70   Pulse 93   Temp 97.1 F (36.2 C)   Resp 14   Ht 5\' 9"  (1.753 m)   Wt 285 lb 12.8 oz (129.6 kg)   SpO2 97%   BMI 42.21 kg/m  General Appearance: Well nourished, in no apparent distress.  Eyes: PERRLA, EOMs, conjunctiva no swelling or erythema, normal fundi and vessels.  Sinuses: No Frontal/maxillary tenderness  ENT/Mouth: Ext aud canals clear, normal light reflex with TMs  without erythema, bulging. Good dentition. No erythema, swelling, or exudate on post pharynx. Tonsils not swollen or erythematous. Hearing normal.  Neck: Supple, thyroid normal. No bruits  Respiratory: Respiratory effort normal, BS equal bilaterally without rales, rhonchi, wheezing or stridor.  Cardio: RRR without murmurs, rubs or gallops. Brisk peripheral pulses without edema.  Chest: symmetric, with normal excursions and percussion.  Breasts: defer Abdomen: Soft, nontender, no guarding, rebound, hernias, masses, or organomegaly. .  Lymphatics: Non tender without lymphadenopathy.  Genitourinary: defer Musculoskeletal: Full ROM all peripheral extremities,5/5 strength, and normal gait.  Skin: Left chest with scaly erythematous nodule. Warm, dry without rashes, lesions, ecchymosis. Neuro: Cranial nerves intact, reflexes equal bilaterally. Normal muscle tone, no cerebellar symptoms. Sensation intact.  Psych: Awake  and oriented X 3, normal affect, Insight and Judgment appropriate.   EKG: WNL no changes.   Vicie Mutters 10:04 AM [facility]

## 2016-10-06 LAB — URINALYSIS, ROUTINE W REFLEX MICROSCOPIC
BILIRUBIN URINE: NEGATIVE
GLUCOSE, UA: NEGATIVE
HGB URINE DIPSTICK: NEGATIVE
KETONES UR: NEGATIVE
Leukocytes, UA: NEGATIVE
Nitrite: NEGATIVE
PH: 5 (ref 5.0–8.0)
Protein, ur: NEGATIVE
Specific Gravity, Urine: 1.025 (ref 1.001–1.035)

## 2016-10-06 LAB — IRON AND TIBC
%SAT: 26 % (ref 11–50)
Iron: 100 ug/dL (ref 45–160)
TIBC: 390 ug/dL (ref 250–450)
UIBC: 290 ug/dL (ref 125–400)

## 2016-10-06 LAB — MAGNESIUM: MAGNESIUM: 1.5 mg/dL (ref 1.5–2.5)

## 2016-10-06 LAB — HEPATIC FUNCTION PANEL
ALBUMIN: 4 g/dL (ref 3.6–5.1)
ALK PHOS: 48 U/L (ref 33–130)
ALT: 91 U/L — ABNORMAL HIGH (ref 6–29)
AST: 79 U/L — AB (ref 10–35)
Bilirubin, Direct: 0.1 mg/dL (ref <=0.2)
Indirect Bilirubin: 0.4 mg/dL (ref 0.2–1.2)
TOTAL PROTEIN: 7.2 g/dL (ref 6.1–8.1)
Total Bilirubin: 0.5 mg/dL (ref 0.2–1.2)

## 2016-10-06 LAB — MICROALBUMIN / CREATININE URINE RATIO
Creatinine, Urine: 213 mg/dL (ref 20–320)
Microalb Creat Ratio: 24 mcg/mg creat (ref <30)
Microalb, Ur: 5.2 mg/dL

## 2016-10-06 LAB — BASIC METABOLIC PANEL WITH GFR
BUN: 15 mg/dL (ref 7–25)
CO2: 28 mmol/L (ref 20–31)
Calcium: 9.5 mg/dL (ref 8.6–10.4)
Chloride: 103 mmol/L (ref 98–110)
Creat: 0.87 mg/dL (ref 0.50–1.05)
GFR, EST AFRICAN AMERICAN: 89 mL/min (ref >=60)
GFR, EST NON AFRICAN AMERICAN: 77 mL/min (ref >=60)
Glucose, Bld: 163 mg/dL — ABNORMAL HIGH (ref 65–99)
POTASSIUM: 4 mmol/L (ref 3.5–5.3)
SODIUM: 139 mmol/L (ref 135–146)

## 2016-10-06 LAB — TSH: TSH: 2.34 mIU/L

## 2016-10-06 LAB — LIPID PANEL
Cholesterol: 220 mg/dL — ABNORMAL HIGH (ref 125–200)
HDL: 43 mg/dL — ABNORMAL LOW (ref >=46)
LDL CALC: 145 mg/dL — AB (ref <130)
TRIGLYCERIDES: 160 mg/dL — AB (ref <150)
Total CHOL/HDL Ratio: 5.1 Ratio — ABNORMAL HIGH (ref <=5.0)
VLDL: 32 mg/dL — ABNORMAL HIGH (ref <30)

## 2016-10-06 LAB — FERRITIN: Ferritin: 208 ng/mL (ref 10–232)

## 2016-10-06 LAB — INSULIN, FASTING: INSULIN FASTING, SERUM: 16.8 u[IU]/mL (ref 2.0–19.6)

## 2016-10-06 LAB — VITAMIN D 25 HYDROXY (VIT D DEFICIENCY, FRACTURES): Vit D, 25-Hydroxy: 27 ng/mL — ABNORMAL LOW (ref 30–100)

## 2016-10-06 LAB — VITAMIN B12: VITAMIN B 12: 526 pg/mL (ref 200–1100)

## 2016-11-06 ENCOUNTER — Ambulatory Visit (INDEPENDENT_AMBULATORY_CARE_PROVIDER_SITE_OTHER): Payer: Managed Care, Other (non HMO) | Admitting: Physician Assistant

## 2016-11-06 ENCOUNTER — Encounter: Payer: Self-pay | Admitting: Physician Assistant

## 2016-11-06 VITALS — BP 144/90 | HR 100 | Temp 98.2°F | Resp 18 | Ht 69.0 in | Wt 282.0 lb

## 2016-11-06 DIAGNOSIS — E119 Type 2 diabetes mellitus without complications: Secondary | ICD-10-CM

## 2016-11-06 DIAGNOSIS — J320 Chronic maxillary sinusitis: Secondary | ICD-10-CM

## 2016-11-06 DIAGNOSIS — I10 Essential (primary) hypertension: Secondary | ICD-10-CM

## 2016-11-06 MED ORDER — AZITHROMYCIN 250 MG PO TABS: ORAL_TABLET | ORAL | 1 refills | Status: AC

## 2016-11-06 MED ORDER — PHENTERMINE HCL 37.5 MG PO TABS: 37.5000 mg | ORAL_TABLET | Freq: Every day | ORAL | 1 refills | Status: DC

## 2016-11-06 MED ORDER — DAPAGLIFLOZIN PROPANEDIOL 10 MG PO TABS: 10.0000 mg | ORAL_TABLET | Freq: Every day | ORAL | 3 refills | Status: DC

## 2016-11-06 NOTE — Progress Notes (Signed)
Subjective:    Patient ID: Andrea Bush, female    DOB: 04/11/1966, 50 y.o.   MRN: [id number]  HPI 49 y.o. WF presents for follow up for obesity and diabetes. She was placed on phentermine at her CPE. She denies any AE's like palptations, trouble sleeping, has not checked BP at home but states that she has been under extra stress at work and working extra. She would like to continue the medication at this time. Was on farxiga at one time.   She also states that last week she started with sinus infection. She is on zyrtec, mucinex, having teeth pain, sinus pain, sinus drainage, nonproductive cough, has been on alk plus cold and sinus.   Blood pressure (!) 144/90, pulse 100, temperature 98.2 F (36.8 C), temperature source Temporal, resp. rate 18, height '5\' 9"'  (1.753 m), weight 282 lb (127.9 kg).  BMI is Body mass index is 41.64 kg/m., she is working on diet and exercise. Wt Readings from Last 3 Encounters:  11/06/16 282 lb (127.9 kg)  10/05/16 285 lb 12.8 oz (129.6 kg)  12/31/15 284 lb 12.8 oz (129.2 kg)   Medications Current Outpatient Prescriptions on File Prior to Visit  Medication Sig  . cyclobenzaprine (FLEXERIL) 10 MG tablet Take 1 tablet (10 mg total) by mouth at bedtime.  . phentermine 37.5 MG capsule Take 1 capsule (37.5 mg total) by mouth every morning.   No current facility-administered medications on file prior to visit.     Problem list She has Hyperlipidemia; Vitamin D deficiency; Hypertension; Diabetes mellitus without complication ([place]); Morbid obesity ([place]); Hypothyroidism; Anemia, iron deficiency; and Fibrocystic breast on her problem list.  Review of Systems  Constitutional: Negative for chills and diaphoresis.  HENT: Positive for congestion, postnasal drip, sinus pressure and sneezing. Negative for ear pain and sore throat.   Respiratory: Positive for cough. Negative for chest tightness, shortness of breath and wheezing.   Cardiovascular: Negative.    Gastrointestinal: Negative.   Genitourinary: Negative.   Musculoskeletal: Negative for neck pain.  Neurological: Positive for headaches.       Objective:   Physical Exam  Constitutional: She appears well-developed and well-nourished.  HENT:  Head: Normocephalic and atraumatic.  Right Ear: External ear normal.  Nose: Right sinus exhibits maxillary sinus tenderness. Right sinus exhibits no frontal sinus tenderness. Left sinus exhibits maxillary sinus tenderness. Left sinus exhibits no frontal sinus tenderness.  Eyes: Conjunctivae and EOM are normal.  Neck: Normal range of motion. Neck supple.  Cardiovascular: Normal rate, regular rhythm, normal heart sounds and intact distal pulses.   Pulmonary/Chest: Effort normal and breath sounds normal. No respiratory distress. She has no wheezes.  Abdominal: Soft. Bowel sounds are normal.  Lymphadenopathy:    She has cervical adenopathy.  Skin: Skin is warm and dry.       Assessment & Plan:  1. Diabetes mellitus without complication ([place]) Start on farxiga, samples given, follow up 1 month  2. Essential hypertension - continue medications, DASH diet, exercise and monitor at home. Call if greater than 130/80.  - start farxiga, monitor BP at home, if continues above 140/90 will stop phentermine and start contrave -? From sinus meds, stop any with sudafed in it.   3. Morbid obesity ([place]) - long discussion about weight loss, diet, and exercise, will start the patient on phentermine- hand out given and AE's discussed, will do close follow up.   4. Maxillary sinusitis, unspecified chronicity Zpak, continue zyrtec, follow up if not better

## 2016-11-06 NOTE — Patient Instructions (Addendum)
Please start farxiga 5mg  x 1 week then go up to 10mg  farxiga. This can decreases your blood pressure so please contact us if you have any dizziness, sometime we need to decrease or stop fluid pills or decrease BP meds. You are peeing out 300-400 calories a day of sugar which can lead to yeast infections, you can take the diflucan as needed but if the infections continue we will stop the medications. We will have you follow up in 1 month to check your kidney function and weight. Call if you need anything.    Who Qualifies for Obesity Medications? Although everyone is hopeful for a fast and easy way to lose weight, nothing has been shown to replace a prudent, calorie-controlled diet along with behavior modification as a cornerstone for all obesity treatments.  The next tool that can be used to achieve weight-loss and health improvement is medication. Pharmacotherapy may be offered to individuals affected by obesity who have failed to achieve weight-loss through diet and exercise alone. Currently there are several drugs that are approved by the FDA for weight-loss: phentermine products (Adipex-P or Suprenza)  lorcaserin HCI (Belviq) phentermine- topiramate ER (Qsymia)  Bupropion; Naltrexone ER (Contrave)  Let's take a closer look at each of these medications and learn how they work:  Phentermine (Adipex-P or Suprenza) How does it work? Phentermine is a medication available by prescription that works on chemicals in the brain to decrease your appetite. It also has a mild stimulant component that adds extra energy. Phentermine is a pill that is taken once a day in the morning time. Tolerance to this medication can develop, so it can only be used for several months at a time. Common side effects are dry mouth, sleeplessness, constipation. Weight-loss: The average weight-loss is 4-5 percent of your weight after one-year. In a 200 pound person, this means about 10 pounds of weight-loss. Patients who  receive phentermine can usually expect to see greater weight-loss than those who receive non-pharmacologic care, on average about 13 pounds difference over 12 weeks as reported in one study. Concerns: Due to its stimulant effect, a person's blood pressure and heart rate may increase when on this medication; therefore, you must be monitored closely by a physician who is experienced in prescribing this medication. It cannot be used in patients with some heart conditions (such as poorly controlled blood pressure), glaucoma (increased pressure in your eye), stroke or overactive thyroid. There is some concern for abuse, but this is minimal if the medication is appropriately used as directed by a healthcare professional.  Lorcaserin (Belviq) How does it work? Lorcaserin was approved in June 2012 by the FDA and became commercially available in June 2013. It works by helping you feel full while eating less, and it works on the chemicals in your brain to help decrease your appetite. Weight-loss: In individuals who took the medication for one-year, it has been shown to have an average of 7 percent weight-loss. In a 200 pound person, this would mean a 14 pound weight-loss. Blood sugar, cholesterol and blood pressure levels have also been shown to improve. Concerns: The most common side effects are headache, dizziness, fatigue, dry mouth, upper [id number] tract infection and nausea.  Response to therapy should be evaluated by week 12.  If a patient has not lost at least 5% of baseline body weigh  Phentermine-Topiramate ER (Qsymia) How does it work? This combination medication was approved by the FDA in July 2012. Topiramate is a medication used to treat seizures. It  was found that a common side effect of this medication was weight-loss. Phentermine, as described in this brochure, helps to increase your energy and decrease your appetite. Weight-loss: Among individuals who took the highest does of Qsymia (15 mg  phentermine and 92 mg of topiramate ER) for one-year, they achieved an average of 14.4 percent weight-loss. In a 200 pound person, a 14.4 percent weight-loss would mean a loss of 29 pounds. Cholesterol levels have also been shown to improve. Concerns: The most common side effects were dry mouth, constipation and pins-and-needle feeling in extremities. Qsymia should NOT be taken during pregnancy since Topiramate ER, a component of Qsymia, has been associated with an increased risk of birth defects.  Bupropion; Naltrexone ER (Contrave) How does it work? Works in two areas of your brain, hunger center and reward center to reduce hunger and cravings.  Weight loss In a 56 week trial patients lost more than 5% of their body weight.  Concerns Most common side effects are dry mouth, constipation or diarrhea, headache.  Please take it with a full glass of water and low fat meal.    Follow-up Visits: Patients are given the opportunity to revisit a topic or obtain more information on an area of interest during follow-up visits. The frequency of and interval between follow-up visits is determined on a patient-by-patient basis. Frequent visits (every 3 to 4 weeks) are encouraged until initial weight-loss goals (5 to 10 percent of body weight) are achieved. At that point, less frequent visits are typically scheduled as needed for individual patients. However, since obesity is considered a chronic life-long problem for many individuals, periodic continual follow up is recommended.   Research has shown that weight-loss as low as 5 percent of initial body weight can lead to favorable improvements in blood pressure, cholesterol, glucose levels and insulin sensitivity. The risk of developing heart disease is reduced the most in patients who have impaired glucose tolerance, type 2 diabetes or high blood pressure.   Before you even begin to attack a weight-loss plan, it pays to remember this: You are not fat. You  have fat. Losing weight isn't about blame or shame; it's simply another achievement to accomplish. Dieting is like any other skill-you have to buckle down and work at it. As long as you act in a smart, reasonable way, you'll ultimately get where you want to be. Here are some weight loss pearls for you.  1. It's Not a Diet. It's a Lifestyle Thinking of a diet as something you're on and suffering through only for the short term doesn't work. To shed weight and keep it off, you need to make permanent changes to the way you eat. It's OK to indulge occasionally, of course, but if you cut calories temporarily and then revert to your old way of eating, you'll gain back the weight quicker than you can say yo-yo. Use it to lose it. Research shows that one of the best predictors of long-term weight loss is how many pounds you drop in the first month. For that reason, nutritionists often suggest being stricter for the first two weeks of your new eating strategy to build momentum. Cut out added sugar and alcohol and avoid unrefined carbs. After that, figure out how you can reincorporate them in a way that's healthy and maintainable.  2. There's a Right Way to Exercise Working out burns calories and fat and boosts your metabolism by building muscle. But those trying to lose weight are notorious for overestimating the number  of calories they burn and underestimating the amount they take in. Unfortunately, your system is biologically programmed to hold on to extra pounds and that means when you start exercising, your body senses the deficit and ramps up its hunger signals. If you're not diligent, you'll eat everything you burn and then some. Use it to lose it. Cardio gets all the exercise glory, but strength and interval training are the real heroes. They help you build lean muscle, which in turn increases your metabolism and calorie-burning ability 3. Don't Overreact to Mild Hunger Some people have a hard time losing  weight because of hunger anxiety. To them, being hungry is bad-something to be avoided at all costs-so they carry snacks with them and eat when they don't need to. Others eat because they're stressed out or bored. While you never want to get to the point of being ravenous (that's when bingeing is likely to happen), a hunger pang, a craving, or the fact that it's 3:00 p.m. should not send you racing for the vending machine or obsessing about the energy bar in your purse. Ideally, you should put off eating until your stomach is growling and it's difficult to concentrate.  Use it to lose it. When you feel the urge to eat, use the HALT method. Ask yourself, Am I really hungry? Or am I angry or anxious, lonely or bored, or tired? If you're still not certain, try the apple test. If you're truly hungry, an apple should seem delicious; if it doesn't, something else is going on. Or you can try drinking water and making yourself busy, if you are still hungry try a healthy snack.  4. Not All Calories Are Created Equal The mechanics of weight loss are pretty simple: Take in fewer calories than you use for energy. But the kind of food you eat makes all the difference. Processed food that's high in saturated fat and refined starch or sugar can cause inflammation that disrupts the hormone signals that tell your brain you're full. The result: You eat a lot more.  Use it to lose it. Clean up your diet. Swap in whole, unprocessed foods, including vegetables, lean protein, and healthy fats that will fill you up and give you the biggest nutritional bang for your calorie buck. In a few weeks, as your brain starts receiving regular hunger and fullness signals once again, you'll notice that you feel less hungry overall and naturally start cutting back on the amount you eat.  5. Protein, Produce, and Plant-Based Fats Are Your Weight-Loss Trinity Here's why eating the three Ps regularly will help you drop pounds. Protein fills you  up. You need it to build lean muscle, which keeps your metabolism humming so that you can torch more fat. People in a weight-loss program who ate double the recommended daily allowance for protein (about 110 grams for a 150-pound woman) lost 70 percent of their weight from fat, while people who ate the RDA lost only about 40 percent, one study found. Produce is packed with filling fiber. "It's very difficult to consume too many calories if you're eating a lot of vegetables. Example: Three cups of broccoli is a lot of food, yet only 93 calories. (Fruit is another story. It can be easy to overeat and can contain a lot of calories from sugar, so be sure to monitor your intake.) Plant-based fats like olive oil and those in avocados and nuts are healthy and extra satiating.  Use it to lose it. Aim to incorporate  each of the three Ps into every meal and snack. People who eat protein throughout the day are able to keep weight off, according to a study in the [place] of Clinical Nutrition. In addition to meat, poultry and seafood, good sources are beans, lentils, eggs, tofu, and yogurt. As for fat, keep portion sizes in check by measuring out salad dressing, oil, and nut butters (shoot for one to two tablespoons). Finally, eat veggies or a little fruit at every meal. People who did that consumed 308 fewer calories but didn't feel any hungrier than when they didn't eat more produce.  7. How You Eat Is As Important As What You Eat In order for your brain to register that you're full, you need to focus on what you're eating. Sit down whenever you eat, preferably at a table. Turn off the TV or computer, put down your phone, and look at your food. Smell it. Chew slowly, and don't put another bite on your fork until you swallow. When women ate lunch this attentively, they consumed 30 percent less when snacking later than those who listened to an audiobook at lunchtime, according to a study in the [place] of  Nutrition. 8. Weighing Yourself Really Works The scale provides the best evidence about whether your efforts are paying off. Seeing the numbers tick up or down or stagnate is motivation to keep going-or to rethink your approach. A 2015 study at [facility] found that daily weigh-ins helped people lose more weight, keep it off, and maintain that loss, even after two years. Use it to lose it. Step on the scale at the same time every day for the best results. If your weight shoots up several pounds from one weigh-in to the next, don't freak out. Eating a lot of salt the night before or having your period is the likely culprit. The number should return to normal in a day or two. It's a steady climb that you need to do something about. 9. Too Much Stress and Too Little Sleep Are Your Enemies When you're tired and frazzled, your body cranks up the production of cortisol, the stress hormone that can cause carb cravings. Not getting enough sleep also boosts your levels of ghrelin, a hormone associated with hunger, while suppressing leptin, a hormone that signals fullness and satiety. People on a diet who slept only five and a half hours a night for two weeks lost 55 percent less fat and were hungrier than those who slept eight and a half hours, according to a study in the [place]. Use it to lose it. Prioritize sleep, aiming for seven hours or more a night, which research shows helps lower stress. And make sure you're getting quality zzz's. If a snoring spouse or a fidgety cat wakes you up frequently throughout the night, you may end up getting the equivalent of just four hours of sleep, according to a study from [facility]. Keep pets out of the bedroom, and use a white-noise app to drown out snoring. 10. You Will Hit a plateau-And You Can Bust Through It As you slim down, your body releases much less leptin, the fullness hormone.  If you're not strength training, start  right now. Building muscle can raise your metabolism to help you overcome a plateau. To keep your body challenged and burning calories, incorporate new moves and more intense intervals into your workouts or add another sweat session to your weekly routine. Alternatively, cut an extra 100 calories  or so a day from your diet. Now that you've lost weight, your body simply doesn't need as much fuel.

## 2016-11-28 ENCOUNTER — Other Ambulatory Visit: Payer: Self-pay | Admitting: Physician Assistant

## 2016-11-28 MED ORDER — LEVOFLOXACIN 500 MG PO TABS: 500.0000 mg | ORAL_TABLET | Freq: Every day | ORAL | 0 refills | Status: DC

## 2016-11-28 NOTE — Progress Notes (Signed)
Send in levaquin for patient.

## 2016-11-29 ENCOUNTER — Ambulatory Visit: Payer: Self-pay | Admitting: Physician Assistant

## 2017-01-09 ENCOUNTER — Ambulatory Visit: Payer: Self-pay | Admitting: Physician Assistant

## 2017-01-22 ENCOUNTER — Ambulatory Visit (INDEPENDENT_AMBULATORY_CARE_PROVIDER_SITE_OTHER): Payer: Managed Care, Other (non HMO) | Admitting: Physician Assistant

## 2017-01-22 ENCOUNTER — Encounter: Payer: Self-pay | Admitting: Physician Assistant

## 2017-01-22 VITALS — BP 136/84 | HR 92 | Temp 97.0°F | Resp 16 | Ht 69.0 in | Wt 276.4 lb

## 2017-01-22 DIAGNOSIS — E785 Hyperlipidemia, unspecified: Secondary | ICD-10-CM

## 2017-01-22 DIAGNOSIS — E039 Hypothyroidism, unspecified: Secondary | ICD-10-CM | POA: Diagnosis not present

## 2017-01-22 DIAGNOSIS — Z79899 Other long term (current) drug therapy: Secondary | ICD-10-CM | POA: Diagnosis not present

## 2017-01-22 DIAGNOSIS — E119 Type 2 diabetes mellitus without complications: Secondary | ICD-10-CM

## 2017-01-22 DIAGNOSIS — E559 Vitamin D deficiency, unspecified: Secondary | ICD-10-CM

## 2017-01-22 DIAGNOSIS — I10 Essential (primary) hypertension: Secondary | ICD-10-CM | POA: Diagnosis not present

## 2017-01-22 LAB — CBC WITH DIFFERENTIAL/PLATELET
BASOS ABS: 0 {cells}/uL (ref 0–200)
Basophils Relative: 0 %
EOS ABS: 84 {cells}/uL (ref 15–500)
Eosinophils Relative: 1 %
HEMATOCRIT: 47.3 % — AB (ref 35.0–45.0)
Hemoglobin: 16.1 g/dL — ABNORMAL HIGH (ref 11.7–15.5)
LYMPHS PCT: 38 %
Lymphs Abs: 3192 cells/uL (ref 850–3900)
MCH: 30.9 pg (ref 27.0–33.0)
MCHC: 34 g/dL (ref 32.0–36.0)
MCV: 90.8 fL (ref 80.0–100.0)
MONO ABS: 504 {cells}/uL (ref 200–950)
MPV: 8.8 fL (ref 7.5–12.5)
Monocytes Relative: 6 %
NEUTROS PCT: 55 %
Neutro Abs: 4620 cells/uL (ref 1500–7800)
Platelets: 253 10*3/uL (ref 140–400)
RBC: 5.21 MIL/uL — ABNORMAL HIGH (ref 3.80–5.10)
RDW: 12.8 % (ref 11.0–15.0)
WBC: 8.4 10*3/uL (ref 3.8–10.8)

## 2017-01-22 LAB — BASIC METABOLIC PANEL WITH GFR
BUN: 15 mg/dL (ref 7–25)
CALCIUM: 9.5 mg/dL (ref 8.6–10.4)
CO2: 27 mmol/L (ref 20–31)
Chloride: 101 mmol/L (ref 98–110)
Creat: 0.83 mg/dL (ref 0.50–1.05)
GFR, Est Non African American: 81 mL/min (ref >=60)
GLUCOSE: 185 mg/dL — AB (ref 65–99)
Potassium: 4.1 mmol/L (ref 3.5–5.3)
Sodium: 138 mmol/L (ref 135–146)

## 2017-01-22 LAB — HEPATIC FUNCTION PANEL
ALT: 76 U/L — ABNORMAL HIGH (ref 6–29)
AST: 60 U/L — ABNORMAL HIGH (ref 10–35)
Albumin: 4.2 g/dL (ref 3.6–5.1)
Alkaline Phosphatase: 50 U/L (ref 33–130)
BILIRUBIN DIRECT: 0.1 mg/dL (ref <=0.2)
BILIRUBIN INDIRECT: 0.4 mg/dL (ref 0.2–1.2)
Total Bilirubin: 0.5 mg/dL (ref 0.2–1.2)
Total Protein: 7.5 g/dL (ref 6.1–8.1)

## 2017-01-22 LAB — LIPID PANEL
CHOLESTEROL: 215 mg/dL — AB (ref <200)
HDL: 48 mg/dL — ABNORMAL LOW (ref >50)
LDL CALC: 135 mg/dL — AB (ref <100)
TRIGLYCERIDES: 162 mg/dL — AB (ref <150)
Total CHOL/HDL Ratio: 4.5 Ratio (ref <5.0)
VLDL: 32 mg/dL — AB (ref <30)

## 2017-01-22 LAB — HEMOGLOBIN A1C
Hgb A1c MFr Bld: 7.7 % — ABNORMAL HIGH (ref <5.7)
Mean Plasma Glucose: 174 mg/dL

## 2017-01-22 LAB — TSH: TSH: 2.47 mIU/L

## 2017-01-22 MED ORDER — ASPIRIN EC 81 MG PO TBEC: 81.0000 mg | DELAYED_RELEASE_TABLET | Freq: Every day | ORAL | 0 refills | Status: AC

## 2017-01-22 NOTE — Progress Notes (Signed)
Assessment and Plan:   Hypertension -Continue medication, monitor blood pressure at home. Continue DASH diet.  Reminder to go to the ER if any CP, SOB, nausea, dizziness, severe HA, changes vision/speech, left arm numbness and tingling and jaw pain.  Cholesterol -Continue diet and exercise. Check cholesterol.    Diabetes with diabetic chronic kidney disease -Continue diet and exercise. Check A1C - get on cinnamon  - continue weight loss  Vitamin D Def - check level and continue medications.   Morbid Obesity with co morbidities - long discussion about weight loss, diet, and exercise - continue phentermine PRN   Continue diet and meds as discussed. Further disposition pending results of labs. Discussed med's effects and SE's.   Over 30 minutes of exam, counseling, chart review, and critical decision making was performed Future Appointments Date Time Provider [place]  01/22/2017 11:15 AM Vicie Mutters, PA-C [facility] None  10/09/2017 10:00 AM Vicie Mutters, PA-C [facility] None     HPI 51 y.o. female  presents for 3 month follow up on hypertension, cholesterol, diabetes and vitamin D deficiency.   Her blood pressure has been controlled at home, today their BP is     She does workout. She denies chest pain, shortness of breath, dizziness.  She is not on cholesterol medication and denies myalgias. Her cholesterol is not at goal. The cholesterol last visit was:  Lab Results  Component Value Date   CHOL 220 (H) 10/05/2016   HDL 43 (L) 10/05/2016   LDLCALC 145 (H) 10/05/2016   TRIG 160 (H) 10/05/2016   CHOLHDL 5.1 (H) 10/05/2016   She has been working on diet and exercise for Diabetes with diabetic chronic kidney disease, she is on bASA, she was on farxiga but stopped due to possible nasopharyngeal issues she contributed to the [place], she is not on ACE/ARB, and denies paresthesia of the feet, polydipsia, polyuria and visual disturbances. Last A1C was:  Lab Results   Component Value Date   HGBA1C 8.1 (H) 10/05/2016   Lab Results  Component Value Date   GFRNONAA 77 10/05/2016   Patient is on Vitamin D supplement.   Lab Results  Component Value Date   VD25OH 27 (L) 10/05/2016     BMI is Body mass index is 40.82 kg/m., she is working on diet and exercise.she will sporadically take her phentermine but has been working on weight loss.  Wt Readings from Last 3 Encounters:  01/22/17 276 lb 6.4 oz (125.4 kg)  11/06/16 282 lb (127.9 kg)  10/05/16 285 lb 12.8 oz (129.6 kg)    Current Medications:  Current Outpatient Prescriptions on File Prior to Visit  Medication Sig  . cetirizine (ZYRTEC) 10 MG tablet Take 10 mg by mouth daily.  . cyclobenzaprine (FLEXERIL) 10 MG tablet Take 1 tablet (10 mg total) by mouth at bedtime.  . phentermine (ADIPEX-P) 37.5 MG tablet Take 1 tablet (37.5 mg total) by mouth daily before breakfast.   No current facility-administered medications on file prior to visit.     Medical History:  Past Medical History:  Diagnosis Date  . Fibrocystic breast   . Hyperlipidemia   . Hypertension   . Hypothyroidism   . Iron deficiency anemia   . Obesity    BMI 41  . Prediabetes   . Pulmonary embolism ([place]) 2006   from BCP  . Vitamin D deficiency    Allergies:  Allergies  Allergen Reactions  . Crestor [Rosuvastatin]     Hair Loss   . Penicillins  Rash     Review of Systems:  ROS  Family history- Review and unchanged Social history- Review and unchanged Physical Exam: There were no vitals taken for this visit. Wt Readings from Last 3 Encounters:  11/06/16 282 lb (127.9 kg)  10/05/16 285 lb 12.8 oz (129.6 kg)  12/31/15 284 lb 12.8 oz (129.2 kg)   General Appearance: Well nourished, in no apparent distress. Eyes: PERRLA, EOMs, conjunctiva no swelling or erythema Sinuses: No Frontal/maxillary tenderness ENT/Mouth: Ext aud canals clear, TMs without erythema, bulging. No erythema, swelling, or exudate on post  pharynx.  Tonsils not swollen or erythematous. Hearing normal.  Neck: Supple, thyroid normal.  Respiratory: Respiratory effort normal, BS equal bilaterally without rales, rhonchi, wheezing or stridor.  Cardio: RRR with no MRGs. Brisk peripheral pulses without edema.  Abdomen: Soft, + BS.  Non tender, no guarding, rebound, hernias, masses. Lymphatics: Non tender without lymphadenopathy.  Musculoskeletal: Full ROM, 5/5 strength, Normal gait Skin: Warm, dry without rashes, lesions, ecchymosis.  Neuro: Cranial nerves intact. No cerebellar symptoms.  Psych: Awake and oriented X 3, normal affect, Insight and Judgment appropriate.    Vicie Mutters, PA-C 7:43 AM [facility]

## 2017-01-22 NOTE — Patient Instructions (Addendum)
Can try cinnamon 1000mg  once or twice daily Continue phentermine as needed Get on 5000 IU daily vitamin D    Vitamin D goal is between 60-80  Please make sure that you are taking your Vitamin D as directed.   It is very important as a natural anti-inflammatory   helping hair, skin, and nails, as well as reducing stroke and heart attack risk.   It helps your bones and helps with mood.  We want you on at least 5000 IU daily  It also decreases numerous cancer risks so please take it as directed.   Low Vit D is associated with a 200-300% higher risk for CANCER   and 200-300% higher risk for HEART   ATTACK  &  STROKE.    .....................................Marland Kitchen  It is also associated with higher death rate at younger ages,   autoimmune diseases like Rheumatoid arthritis, Lupus, Multiple Sclerosis.     Also many other serious conditions, like depression, Alzheimer's  Dementia, infertility, muscle aches, fatigue, fibromyalgia - just to name a few.  +++++++++++++++++++  Can get liquid vitamin D from [facility] here in [place] at  [facility] alternatives [address] [place], [place] [address] Or you can try earth fare   Simple math prevails.    1st - exercise does not produce significant weight loss - at best one converts fat into muscle , "bulks up", loses inches, but usually stays "weight neutral"     2nd - think of your body weightas a check book: If you eat more calories than you burn up - you save money or gain weight .... Or if you spend more money than you put in the check book, ie burn up more calories than you eat, then you lose weight     3rd - if you walk or run 1 mile, you burn up 100 calories - you have to burn up 3,500 calories to lose 1 pound, ie you have to walk/run 35 miles to lose 1 measly pound. So if you want to lose 10 #, then you have to walk/run 350 miles, so.... clearly exercise is not the solution.     4. So if you consume 1,500 calories, then you have to  burn up the equivalent of 15 miles to stay weight neutral - It also stands to reason that if you consume 1,500 cal/day and don't lose weight, then you must be burning up about 1,500 cals/day to stay weight neutral.     5. If you really want to lose weight, you must cut your calorie intake 300 calories /day and at that rate you should lose about 1 # every 3 days.   6. Please purchase Dr Fara Olden Fuhrman's book(s) "The End of Dieting" & "Eat to Live" . It has some great concepts and recipes.      Recommendations For Diabetic/Prediabetic Patients:   -  Take medications as prescribed  -  Recommend Dr Fara Olden Fuhrman's book "The End of Diabetes "  And "The End of Dieting"- Can get at  [email or web address] and encourage also get the Audio CD book  - AVOID Animal products, ie. Meat - red/white, Poultry and Dairy/especially cheese - Exercise at least 5 times a week for 30 minutes or preferably daily.  - No Smoking - Drink less than 2 drinks a day.  - Monitor your feet for sores - Have yearly Eye Exams - Recommend annual Flu vaccine  - Recommend Pneumovax and Prevnar vaccines - Shingles Vaccine (Zostavax) if over 60  y.o.  Goals:   - BMI less than 24 - Fasting sugar less than 130 or less than 150 if tapering medicines to lose weight  - Systolic BP less than [id number]  - Diastolic BP less than 80 - Bad LDL Cholesterol less than 70 - Triglycerides less than 150

## 2017-01-23 LAB — MAGNESIUM: Magnesium: 1.7 mg/dL (ref 1.5–2.5)

## 2017-01-23 LAB — VITAMIN D 25 HYDROXY (VIT D DEFICIENCY, FRACTURES): Vit D, 25-Hydroxy: 26 ng/mL — ABNORMAL LOW (ref 30–100)

## 2017-04-18 ENCOUNTER — Other Ambulatory Visit: Payer: Self-pay | Admitting: Physician Assistant

## 2017-04-18 NOTE — Telephone Encounter (Signed)
Phentermine called into pharmacy on 9th May 2018 @ 9:22am by DD

## 2017-04-26 NOTE — Progress Notes (Signed)
Assessment and Plan:   Hypertension -Continue medication, monitor blood pressure at home. Continue DASH diet.  Reminder to go to the ER if any CP, SOB, nausea, dizziness, severe HA, changes vision/speech, left arm numbness and tingling and jaw pain.  Cholesterol -Continue diet and exercise. Check cholesterol.    Diabetes with diabetic chronic kidney disease -Continue diet and exercise. Check A1C - get on cinnamon  - continue weight loss  Vitamin D Def - check level and continue medications.   Morbid Obesity with co morbidities - long discussion about weight loss, diet, and exercise Doing great with weight loss keep it up - continue phentermine PRN  Anxiety Xanax 0.5 #30 NR  Continue diet and meds as discussed. Further disposition pending results of labs. Discussed med's effects and SE's.   Over 30 minutes of exam, counseling, chart review, and critical decision making was performed Future Appointments Date Time Provider [place]  10/09/2017 10:00 AM Vicie Mutters, PA-C [facility] None     HPI 51 y.o. female  presents for 3 month follow up on hypertension, cholesterol, diabetes and vitamin D deficiency.   Her blood pressure has been controlled at home, today their BP is BP: 128/72   She does workout. She denies chest pain, shortness of breath, dizziness. [place] time with daughter's dance group, gets some anxiety with drive down and with crowds.   She is not on cholesterol medication and denies myalgias. Her cholesterol is not at goal. The cholesterol last visit was:   Lab Results  Component Value Date   CHOL 215 (H) 01/22/2017   HDL 48 (L) 01/22/2017   LDLCALC 135 (H) 01/22/2017   TRIG 162 (H) 01/22/2017   CHOLHDL 4.5 01/22/2017   She has been working on diet and exercise for Diabetes with diabetic chronic kidney disease, she is on bASA, she was on farxiga but stopped due to possible nasopharyngeal issues she contributed to the  [place], she is not on ACE/ARB, she does not check sugars at home, and denies paresthesia of the feet, polydipsia, polyuria and visual disturbances. Last A1C was:  Lab Results  Component Value Date   HGBA1C 7.7 (H) 01/22/2017   Lab Results  Component Value Date   GFRNONAA 81 01/22/2017   Patient is on Vitamin D supplement, 5000 IU once daily.    Lab Results  Component Value Date   VD25OH 26 (L) 01/22/2017     BMI is Body mass index is 39.87 kg/m., she is working on diet and exercise.she will sporadically take her phentermine but has been working on weight loss.  Wt Readings from Last 3 Encounters:  04/30/17 270 lb (122.5 kg)  01/22/17 276 lb 6.4 oz (125.4 kg)  11/06/16 282 lb (127.9 kg)    Current Medications:  Current Outpatient Prescriptions on File Prior to Visit  Medication Sig  . aspirin EC 81 MG tablet Take 1 tablet (81 mg total) by mouth daily.  . cetirizine (ZYRTEC) 10 MG tablet Take 10 mg by mouth daily.  . cyclobenzaprine (FLEXERIL) 10 MG tablet Take 1 tablet (10 mg total) by mouth at bedtime.  . phentermine (ADIPEX-P) 37.5 MG tablet TAKE 1 TABLET BY MOUTH DAILY BEFORE BREAKFAST   No current facility-administered medications on file prior to visit.     Medical History:  Past Medical History:  Diagnosis Date  . Fibrocystic breast   . Hyperlipidemia   . Hypertension   . Hypothyroidism   . Iron deficiency anemia   .  Obesity    BMI 41  . Prediabetes   . Pulmonary embolism ([place]) 2006   from BCP  . Vitamin D deficiency    Allergies:  Allergies  Allergen Reactions  . Crestor [Rosuvastatin]     Hair Loss   . Penicillins Rash     Review of Systems:  Review of Systems  Constitutional: Negative.   HENT: Negative.   Eyes: Negative.   Respiratory: Negative.   Cardiovascular: Negative.   Gastrointestinal: Negative.   Genitourinary: Negative.   Musculoskeletal: Negative.   Skin: Negative.     Family history- Review and unchanged Social history- Review  and unchanged Physical Exam: BP 128/72   Pulse 66   Temp 97.4 F (36.3 C)   Resp 16   Ht 5\' 9"  (1.753 m)   Wt 270 lb (122.5 kg)   SpO2 99%   BMI 39.87 kg/m  Wt Readings from Last 3 Encounters:  04/30/17 270 lb (122.5 kg)  01/22/17 276 lb 6.4 oz (125.4 kg)  11/06/16 282 lb (127.9 kg)   General Appearance: Well nourished, in no apparent distress. Eyes: PERRLA, EOMs, conjunctiva no swelling or erythema Sinuses: No Frontal/maxillary tenderness ENT/Mouth: Ext aud canals clear, TMs without erythema, bulging. No erythema, swelling, or exudate on post pharynx.  Tonsils not swollen or erythematous. Hearing normal.  Neck: Supple, thyroid normal.  Respiratory: Respiratory effort normal, BS equal bilaterally without rales, rhonchi, wheezing or stridor.  Cardio: RRR with no MRGs. Brisk peripheral pulses without edema.  Abdomen: Soft, + BS.  Non tender, no guarding, rebound, hernias, masses. Lymphatics: Non tender without lymphadenopathy.  Musculoskeletal: Full ROM, 5/5 strength, Normal gait Skin: Warm, dry without rashes, lesions, ecchymosis.  Neuro: Cranial nerves intact. No cerebellar symptoms.  Psych: Awake and oriented X 3, normal affect, Insight and Judgment appropriate.    Vicie Mutters, PA-C 9:02 AM [facility]

## 2017-04-30 ENCOUNTER — Ambulatory Visit (INDEPENDENT_AMBULATORY_CARE_PROVIDER_SITE_OTHER): Payer: 59 | Admitting: Physician Assistant

## 2017-04-30 ENCOUNTER — Encounter: Payer: Self-pay | Admitting: Physician Assistant

## 2017-04-30 VITALS — BP 128/72 | HR 66 | Temp 97.4°F | Resp 16 | Ht 69.0 in | Wt 270.0 lb

## 2017-04-30 DIAGNOSIS — Z79899 Other long term (current) drug therapy: Secondary | ICD-10-CM

## 2017-04-30 DIAGNOSIS — I10 Essential (primary) hypertension: Secondary | ICD-10-CM

## 2017-04-30 DIAGNOSIS — E559 Vitamin D deficiency, unspecified: Secondary | ICD-10-CM

## 2017-04-30 DIAGNOSIS — E785 Hyperlipidemia, unspecified: Secondary | ICD-10-CM

## 2017-04-30 DIAGNOSIS — E119 Type 2 diabetes mellitus without complications: Secondary | ICD-10-CM | POA: Diagnosis not present

## 2017-04-30 DIAGNOSIS — E039 Hypothyroidism, unspecified: Secondary | ICD-10-CM

## 2017-04-30 LAB — CBC WITH DIFFERENTIAL/PLATELET
BASOS ABS: 0 {cells}/uL (ref 0–200)
BASOS PCT: 0 %
EOS ABS: 108 {cells}/uL (ref 15–500)
Eosinophils Relative: 2 %
HEMATOCRIT: 47.1 % — AB (ref 35.0–45.0)
Hemoglobin: 15.9 g/dL — ABNORMAL HIGH (ref 11.7–15.5)
LYMPHS PCT: 55 %
Lymphs Abs: 2970 cells/uL (ref 850–3900)
MCH: 30.8 pg (ref 27.0–33.0)
MCHC: 33.8 g/dL (ref 32.0–36.0)
MCV: 91.3 fL (ref 80.0–100.0)
MONO ABS: 378 {cells}/uL (ref 200–950)
MPV: 8.6 fL (ref 7.5–12.5)
Monocytes Relative: 7 %
NEUTROS PCT: 36 %
Neutro Abs: 1944 cells/uL (ref 1500–7800)
Platelets: 255 10*3/uL (ref 140–400)
RBC: 5.16 MIL/uL — ABNORMAL HIGH (ref 3.80–5.10)
RDW: 13 % (ref 11.0–15.0)
WBC: 5.4 10*3/uL (ref 3.8–10.8)

## 2017-04-30 LAB — BASIC METABOLIC PANEL WITH GFR
BUN: 12 mg/dL (ref 7–25)
CO2: 27 mmol/L (ref 20–31)
Calcium: 9.4 mg/dL (ref 8.6–10.4)
Chloride: 101 mmol/L (ref 98–110)
Creat: 0.8 mg/dL (ref 0.50–1.05)
GFR, Est Non African American: 84 mL/min (ref >=60)
GLUCOSE: 183 mg/dL — AB (ref 65–99)
POTASSIUM: 4.4 mmol/L (ref 3.5–5.3)
Sodium: 137 mmol/L (ref 135–146)

## 2017-04-30 LAB — LIPID PANEL
CHOL/HDL RATIO: 4.7 ratio (ref <5.0)
CHOLESTEROL: 217 mg/dL — AB (ref <200)
HDL: 46 mg/dL — ABNORMAL LOW (ref >50)
LDL Cholesterol: 148 mg/dL — ABNORMAL HIGH (ref <100)
TRIGLYCERIDES: 115 mg/dL (ref <150)
VLDL: 23 mg/dL (ref <30)

## 2017-04-30 LAB — HEPATIC FUNCTION PANEL
ALBUMIN: 4.3 g/dL (ref 3.6–5.1)
ALK PHOS: 52 U/L (ref 33–130)
ALT: 74 U/L — AB (ref 6–29)
AST: 50 U/L — ABNORMAL HIGH (ref 10–35)
Bilirubin, Direct: 0.1 mg/dL (ref <=0.2)
Indirect Bilirubin: 0.5 mg/dL (ref 0.2–1.2)
TOTAL PROTEIN: 7.5 g/dL (ref 6.1–8.1)
Total Bilirubin: 0.6 mg/dL (ref 0.2–1.2)

## 2017-04-30 LAB — TSH: TSH: 2.22 m[IU]/L

## 2017-04-30 LAB — MAGNESIUM: MAGNESIUM: 1.8 mg/dL (ref 1.5–2.5)

## 2017-04-30 MED ORDER — ALPRAZOLAM 0.5 MG PO TABS: 0.5000 mg | ORAL_TABLET | Freq: Three times a day (TID) | ORAL | 0 refills | Status: DC | PRN

## 2017-04-30 NOTE — Patient Instructions (Signed)
Simple math prevails.    1st - exercise does not produce significant weight loss - at best one converts fat into muscle , "bulks up", loses inches, but usually stays "weight neutral"     2nd - think of your body weightas a check book: If you eat more calories than you burn up - you save money or gain weight .... Or if you spend more money than you put in the check book, ie burn up more calories than you eat, then you lose weight     3rd - if you walk or run 1 mile, you burn up 100 calories - you have to burn up 3,500 calories to lose 1 pound, ie you have to walk/run 35 miles to lose 1 measly pound. So if you want to lose 10 #, then you have to walk/run 350 miles, so.... clearly exercise is not the solution.     4. So if you consume 1,500 calories, then you have to burn up the equivalent of 15 miles to stay weight neutral - It also stands to reason that if you consume 1,500 cal/day and don't lose weight, then you must be burning up about 1,500 cals/day to stay weight neutral.     5. If you really want to lose weight, you must cut your calorie intake 300 calories /day and at that rate you should lose about 1 # every 3 days.   6. Please purchase Dr Fara Olden Fuhrman's book(s) "The End of Dieting" & "Eat to Live" . It has some great concepts and recipes.      We want weight loss that will last so you should lose 1-2 pounds a week.  THAT IS IT! Please pick THREE things a month to change. Once it is a habit check off the item. Then pick another three items off the list to become habits.  If you are already doing a habit on the list GREAT!  Cross that item off! o Don't drink your calories. Ie, alcohol, soda, fruit juice, and sweet tea.  o Drink more water. Drink a glass when you feel hungry or before each meal.  o Eat breakfast - Complex carb and protein (likeDannon light and fit yogurt, oatmeal, fruit, eggs, [place] bacon). o Measure your cereal.  Eat no more than one cup a day. (ie [place]) o Eat an apple  a day. o Add a vegetable a day. o Try a new vegetable a month. o Use Pam! Stop using oil or butter to cook. o Don't finish your plate or use smaller plates. o Share your dessert. o Eat sugar free Jello for dessert or frozen grapes. o Don't eat 2-3 hours before bed. o Switch to whole wheat bread, pasta, and brown rice. o Make healthier choices when you eat out. No fries! o Pick baked chicken, NOT fried. o Don't forget to SLOW DOWN when you eat. It is not going anywhere.  o Take the stairs. o Park far away in the parking lot o [employer] (or weights) for 10 minutes while watching TV. o Walk at work for 10 minutes during break. o Walk outside 1 time a week with your friend, kids, dog, or significant other. o Start a walking group at [place] the mall as much as you can tolerate.  o Keep a food diary. o Weigh yourself daily. o Walk for 15 minutes 3 days per week. o Cook at home more often and eat out less.  If life happens and you  go back to old habits, it is okay.  Just start over. You can do it!   If you experience chest pain, get short of breath, or tired during the exercise, please stop immediately and inform your doctor.

## 2017-05-01 LAB — HEMOGLOBIN A1C
Hgb A1c MFr Bld: 8.5 % — ABNORMAL HIGH (ref <5.7)
Mean Plasma Glucose: 197 mg/dL

## 2017-05-25 ENCOUNTER — Other Ambulatory Visit: Payer: Self-pay | Admitting: Physician Assistant

## 2017-05-25 NOTE — Telephone Encounter (Signed)
Please call phentermine 

## 2017-10-08 NOTE — Progress Notes (Signed)
Complete Physical  Assessment and Plan: Essential hypertension - continue medications, DASH diet, exercise and monitor at home. Call if greater than 130/80.  - CBC with Differential/Platelet - BASIC METABOLIC PANEL WITH GFR - Hepatic function panel - EKG 12-Lead   Hyperlipidemia  check lipids, decrease fatty foods, increase activity.  - Lipid panel  Diabetes mellitus without complication ([place]) Will add on bASA Declines medications at this time Follow up 3 months due to DM - Hemoglobin A1c - Urinalysis, Routine w reflex microscopic (not at [facility]) - Microalbumin / creatinine urine ratio - EKG 12-Lead  Hypothyroidism, unspecified hypothyroidism type Off medication, will monitor labs.  - TSH  Morbid obesity, unspecified obesity type ([place]) Obesity with co morbidities- long discussion about weight loss, diet, and exercise Will add on phentermine, 1 month follow up obesity/DM   Vitamin D deficiency - Vit D  25 hydroxy (rtn osteoporosis monitoring)  Fibrocystic breast, unspecified laterality GET MGM, will follow up with Dr. Philis Pique, has appt Dec. Declines breast exam/PAP today.    Anemia, iron deficiency - Iron and TIBC - Ferritin - Vitamin B12  Encounter for general adult medical examination with abnormal findings Get MGM from GYN  Colonoscopy-   patient declines a colonoscopy even though the risks and benefits were discussed at length. Colon cancer is 3rd most diagnosed cancer and 2nd leading cause of death in both men and women 74 years of age and older. Patient understands the risk of cancer and death with declining the test, states will get next year  BMI 37.0-37.9, adult Morbid Obesity with co morbidities - long discussion about weight loss, diet, and exercise  sinusitis -     doxycycline (VIBRAMYCIN) 100 MG capsule; Take 1 capsule twice daily with food   Discussed med's effects and SE's. Screening labs and tests as requested with regular follow-up as  recommended. Future Appointments Date Time Provider [place]  10/16/2018 10:00 AM Vicie Mutters, PA-C [facility] None    HPI  51 y.o. female  presents for a complete physical.  Her blood pressure has been controlled at home, today their BP is BP: 136/80  She does workout, she is walking at work. She denies chest pain, shortness of breath, dizziness.  Sinus congestion x 2 weeks, was getting better but then got worse this friday, on zyrtec, alk.  Had toast this AM.  She is not on cholesterol medication and denies myalgias. Her cholesterol is at goal. The cholesterol last visit was:  Lab Results  Component Value Date   CHOL 217 (H) 04/30/2017   HDL 46 (L) 04/30/2017   LDLCALC 148 (H) 04/30/2017   TRIG 115 04/30/2017   CHOLHDL 4.7 04/30/2017    She has been working on diet and exercise for prediabetes but has been in the DM range, she is not on ASA, not on ACE/ARB due to hypotension, she can not tolerate farxiga, and denies paresthesia of the feet, polydipsia, polyuria and visual disturbances. Last A1C in the office was: Lab Results  Component Value Date   HGBA1C 8.5 (H) 04/30/2017   Lab Results  Component Value Date   GFRNONAA 84 04/30/2017   Patient is on Vitamin D supplement.   Lab Results  Component Value Date   VD25OH 26 (L) 01/22/2017   BMI is Body mass index is 37.92 kg/m., her weight is up again. She will drink diet pepsi but drinking more water. Work has been stressful, still working at [employer].  Wt Readings from Last 3 Encounters:  10/09/17 264 lb 4.8 oz (119.9 kg)  04/30/17 270 lb (122.5 kg)  01/22/17 276 lb 6.4 oz (125.4 kg)   She is not on thyroid medication. Her medication was not changed last visit.   Lab Results  Component Value Date   TSH 2.22 04/30/2017   Current Medications:  Current Outpatient Prescriptions on File Prior to Visit  Medication Sig  . aspirin EC 81 MG tablet Take 1 tablet (81 mg total) by mouth daily.  .  cetirizine (ZYRTEC) 10 MG tablet Take 10 mg by mouth daily.  . cyclobenzaprine (FLEXERIL) 10 MG tablet TAKE 1 TABLET (10 MG TOTAL) BY MOUTH AT BEDTIME.   No current facility-administered medications on file prior to visit.     Health Maintenance:   Immunization History  Administered Date(s) Administered  . Pneumococcal-Unspecified 12/12/2003  . Tdap 06/10/2012   Tetanus: 2013 Pneumovax: 2005 Prevnar 13: NA Flu vaccine: declines Zostavax: NA  LMP: No LMP recorded. Patient is postmenopausal.  Pap: follows with [facility] Dec 2017 MGM: Dec 2017suggest 3 D MGM  DEXA: N/A Colonoscopy: DUE- but would prefer to wait until next year, declines hemoccult cards- will think about cologuard EGD:  N/A Last Dental Exam: Dental works Last Eye Exam: 5 years ago but states no issues at this time  Patient Care Team: Unk Pinto, MD as PCP - General (Internal Medicine)  Medical History:  Past Medical History:  Diagnosis Date  . Fibrocystic breast   . Hyperlipidemia   . Hypertension   . Hypothyroidism   . Iron deficiency anemia   . Obesity    BMI 41  . Prediabetes   . Pulmonary embolism ([place]) 2006   from BCP  . Vitamin D deficiency    Allergies Allergies  Allergen Reactions  . Crestor [Rosuvastatin]     Hair Loss   . Penicillins Rash    SURGICAL HISTORY She  has a past surgical history that includes Tubal ligation (Bilateral); Tonsillectomy and adenoidectomy; Ablation (2006); and Thyroid surgery.   FAMILY HISTORY Her family history includes Cancer in her mother; Diabetes in her father; Osteoporosis in her mother.  SOCIAL HISTORY She  reports that she has never smoked. She has never used smokeless tobacco. She reports that she does not drink alcohol or use drugs.  Review of Systems  Constitutional: Positive for malaise/fatigue. Negative for chills, diaphoresis, fever and weight loss.  HENT: Negative.        + snoring, declines sleep study at this time due to cost  Eyes:  Negative.  Negative for blurred vision.  Respiratory: Negative.  Negative for cough, hemoptysis, shortness of breath and wheezing.   Cardiovascular: Negative for chest pain, palpitations, orthopnea, claudication, leg swelling and PND.  Gastrointestinal: Positive for heartburn (depending on what she eats). Negative for abdominal pain, blood in stool, constipation, diarrhea, melena, nausea and vomiting.  Genitourinary: Negative.   Musculoskeletal: Negative.  Negative for falls.  Skin: Negative.   Neurological: Negative.  Negative for weakness.  Psychiatric/Behavioral: Negative.  Negative for depression and suicidal ideas. The patient does not have insomnia.     Physical Exam: Estimated body mass index is 37.92 kg/m as calculated from the following:   Height as of this encounter: '5\' 10"'  (1.778 m).   Weight as of this encounter: 264 lb 4.8 oz (119.9 kg). BP 136/80   Pulse 88   Resp 18   Ht '5\' 10"'  (1.778 m)   Wt 264 lb 4.8 oz (119.9 kg)   SpO2 97%   BMI  37.92 kg/m  General Appearance: Well nourished, in no apparent distress.  Eyes: PERRLA, EOMs, conjunctiva no swelling or erythema, normal fundi and vessels.  Sinuses: No Frontal/maxillary tenderness  ENT/Mouth: Ext aud canals clear, normal light reflex with TMs without erythema, bulging. Good dentition. No erythema, swelling, or exudate on post pharynx. Tonsils not swollen or erythematous. Hearing normal.  Neck: Supple, thyroid normal. No bruits  Respiratory: Respiratory effort normal, BS equal bilaterally without rales, rhonchi, wheezing or stridor.  Cardio: RRR without murmurs, rubs or gallops. Brisk peripheral pulses without edema.  Chest: symmetric, with normal excursions and percussion.  Breasts: defer Abdomen: Soft, nontender, no guarding, rebound, hernias, masses, or organomegaly. .  Lymphatics: Non tender without lymphadenopathy.  Genitourinary: defer Musculoskeletal: Full ROM all peripheral extremities,5/5 strength, and normal  gait.  Skin: Left chest with scaly erythematous nodule. Warm, dry without rashes, lesions, ecchymosis. Neuro: Cranial nerves intact, reflexes equal bilaterally. Normal muscle tone, no cerebellar symptoms. Sensation intact.  Psych: Awake and oriented X 3, normal affect, Insight and Judgment appropriate.   EKG: WNL no changes.   Vicie Mutters 10:24 AM [facility]

## 2017-10-09 ENCOUNTER — Encounter: Payer: Self-pay | Admitting: Physician Assistant

## 2017-10-09 ENCOUNTER — Ambulatory Visit (INDEPENDENT_AMBULATORY_CARE_PROVIDER_SITE_OTHER): Payer: 59 | Admitting: Physician Assistant

## 2017-10-09 VITALS — BP 136/80 | HR 88 | Resp 18 | Ht 70.0 in | Wt 264.3 lb

## 2017-10-09 DIAGNOSIS — Z79899 Other long term (current) drug therapy: Secondary | ICD-10-CM

## 2017-10-09 DIAGNOSIS — I10 Essential (primary) hypertension: Secondary | ICD-10-CM | POA: Diagnosis not present

## 2017-10-09 DIAGNOSIS — F419 Anxiety disorder, unspecified: Secondary | ICD-10-CM

## 2017-10-09 DIAGNOSIS — Z0000 Encounter for general adult medical examination without abnormal findings: Secondary | ICD-10-CM | POA: Diagnosis not present

## 2017-10-09 DIAGNOSIS — Z136 Encounter for screening for cardiovascular disorders: Secondary | ICD-10-CM

## 2017-10-09 DIAGNOSIS — Z6837 Body mass index (BMI) 37.0-37.9, adult: Secondary | ICD-10-CM

## 2017-10-09 DIAGNOSIS — E039 Hypothyroidism, unspecified: Secondary | ICD-10-CM

## 2017-10-09 DIAGNOSIS — D509 Iron deficiency anemia, unspecified: Secondary | ICD-10-CM

## 2017-10-09 DIAGNOSIS — E785 Hyperlipidemia, unspecified: Secondary | ICD-10-CM

## 2017-10-09 DIAGNOSIS — E559 Vitamin D deficiency, unspecified: Secondary | ICD-10-CM

## 2017-10-09 DIAGNOSIS — E119 Type 2 diabetes mellitus without complications: Secondary | ICD-10-CM

## 2017-10-09 DIAGNOSIS — Z0001 Encounter for general adult medical examination with abnormal findings: Secondary | ICD-10-CM

## 2017-10-09 DIAGNOSIS — N6019 Diffuse cystic mastopathy of unspecified breast: Secondary | ICD-10-CM

## 2017-10-09 DIAGNOSIS — J0100 Acute maxillary sinusitis, unspecified: Secondary | ICD-10-CM

## 2017-10-09 MED ORDER — DOXYCYCLINE HYCLATE 100 MG PO CAPS: ORAL_CAPSULE | ORAL | 0 refills | Status: DC

## 2017-10-09 NOTE — Patient Instructions (Addendum)
GET EYE EXAM  The majority of colds are caused by viruses and do not require antibiotics. Please read the rest of this hand out to learn more about the common cold and what you can do to help yourself as well as help prevent the over use of antibiotics.   COMMON COLD SIGNS AND SYMPTOMS - The common cold usually causes nasal congestion, runny nose, and sneezing. A sore throat may be present on the first day but usually resolves quickly. If a cough occurs, it generally develops on about the fourth or fifth day of symptoms, and is always the last thing to go away.   COMMON COLD COMPLICATIONS - In most cases, colds do not cause serious illness or complications. Most colds last for three to seven days, although many people continue to have symptoms (coughing, sneezing, congestion) for up to two weeks.  COMMON COLD TREATMENT - There is no specific treatment for the viruses that cause the common cold. Most treatments are aimed at relieving some of the symptoms of the cold, but do not shorten or cure the cold.   Antibiotics are not useful for treating the common cold; antibiotics are only used to treat illnesses caused by bacteria, not viruses. Unnecessary use of antibiotics for the treatment of the common cold can cause allergic reactions, diarrhea, or other gastrointestinal symptoms in some patients.   The symptoms of a cold will resolve over time, even without any treatment. People with underlying medical conditions and those who use other over-the-counter or prescription medications should speak with their healthcare provider or pharmacist to ensure that it is safe to use these treatments. The following are treatments that may reduce the symptoms caused by the common cold.  Nasal congestion - Decongestants are good for nasal congestion- if you feel very stuffy but no mucus is coming out, this is the medication that will help you the most.  Pseudoephedrine is a decongestant that can improve nasal  congestion. Although a prescription is not required, drugstores in the [place] keep pseudoephedrine behind the counter, so it must be requested from a pharmacist. If you have a heart condition or high blood pressure please use Coricidin BPH instead.   Runny nose - Antihistamines such as diphenhydramine (Benadryl), certazine (Zyrtec) which are best taking at night because they can make you tired OR loratadine (Claritin),  fexafinadine (Allegra) help with a runny nose.   Nasal sprays such an oxymetazoline (Afrin and others) may also give temporary relief of nasal congestion. However, these sprays should never be used for more than two to three days; use for more than three days use can worsen congestion.  Nasocort is now over the counter and can help decrease a runny nose. Please stop the medication if you have blurry vision or nose bleeds.   Sore throat and headache - Sore throat and headache are best treated with a mild pain reliever such as acetaminophen (Tylenol) or a non-steroidal anti-inflammatory agent such as ibuprofen or naproxen (Motrin or Aleve). These medications should be taken with food to prevent stomach problems. As well as gargling with warm water and salt.   Cough - Common cough medicine ingredients include guaifenesin and dextromethorphan; these are often combined with other medications in over-the-counter cold formulas. Often a cough is worse at night or first in the morning due to post nasal drip from you nose. You can try to sleep at an angle to decrease a cough.   Alternative treatments - Heated, humidified air can improve symptoms  of nasal congestion and runny nose, and causes few to no side effects. A number of alternative products, including vitamin C, doubling up on your vitamin D and herbal products such as echinacea, may help. Certain products, such as nasal gels that contain zinc (eg, Zicam), have been associated with a permanent loss of smell.  Antibiotics -  Antibiotics should not be used to treat an uncomplicated common cold. Often you need to give your body 7-10 days to fight off a common cold while treating the symptoms with the medications listed above. If after 7-10 days your symptoms are not improving, you are getting worse, you have shortness of breath, chest pain, a fever of over 103 you should seek medical help immediately.   PREVENTION IS THE BEST MEDICINE - Hand washing is an essential and highly effective way to prevent the spread of infection.  Alcohol-based hand rubs are a good alternative for disinfecting hands if a sink is not available.  Hands should be washed before preparing food and eating and after coughing, blowing the nose, or sneezing. While it is not always possible to limit contact with people who may be infected with a cold, touching the eyes, nose, or mouth after direct contact should be avoided when possible. Sneezing/coughing into the sleeve of one's clothing (at the inner elbow) is another means of containing sprays of saliva and secretions and does not contaminate the hands.      Diabetes is a very complicated disease...lets simplify it.  An easy way to look at it to understand the complications is if you think of the extra sugar floating in your blood stream as glass shards floating through your blood stream.    Diabetes affects your small vessels first: 1) The glass shards (sugar) scraps down the tiny blood vessels in your eyes and lead to diabetic retinopathy, the leading cause of blindness in the [place]. Diabetes is the leading cause of newly diagnosed adult (77 to 53 years of age) blindness in the [place].  2) The glass shards scratches down the tiny vessels of your legs leading to nerve damage called neuropathy and can lead to amputations of your feet. More than 60% of all non-traumatic amputations of lower limbs occur in people with diabetes.  3) Over time the small vessels in your brain are shredded and  closed off, individually this does not cause any problems but over a long period of time many of the small vessels being blocked can lead to Vascular Dementia.   4) Your kidney's are a filter system and have a "net" that keeps certain things in the body and lets bad things out. Sugar shreds this net and leads to kidney damage and eventually failure. Decreasing the sugar that is destroying the net and certain blood pressure medications can help stop or decrease progression of kidney disease. Diabetes was the primary cause of kidney failure in 44 percent of all new cases in 2011.  5) Diabetes also destroys the small vessels in your penis that lead to erectile dysfunction. Eventually the vessels are so damaged that you may not be responsive to cialis or viagra.   Diabetes and your large vessels: Your larger vessels consist of your coronary arteries in your heart and the carotid vessels to your brain. Diabetes or even increased sugars put you at 300% increased risk of heart attack and stroke and this is why.. The sugar scrapes down your large blood vessels and your body sees this as an internal injury and tries  to repair itself. Just like you get a scab on your skin, your platelets will stick to the blood vessel wall trying to heal it. This is why we have diabetics on low dose aspirin daily, this prevents the platelets from sticking and can prevent plaque formation. In addition, your body takes cholesterol and tries to shove it into the open wound. This is why we want your LDL, or bad cholesterol, below 70.   The combination of platelets and cholesterol over 5-10 years forms plaque that can break off and cause a heart attack or stroke.   PLEASE REMEMBER:  Diabetes is preventable! Up to 71 percent of complications and morbidities among individuals with type 2 diabetes can be prevented, delayed, or effectively treated and minimized with regular visits to a health professional, appropriate monitoring and  medication, and a healthy diet and lifestyle.   Cologuard is an easy to use noninvasive colon cancer screening test based on the latest advances in stool DNA science.   Colon cancer is 3rd most diagnosed cancer and 2nd leading cause of death in both men and women 31 years of age and older despite being one of the most preventable and treatable cancers if found early.  4 of out 5 people diagnosed with colon cancer have NO prior family history.  When caught EARLY 90% of colon cancer is curable.   You have agreed to do a Cologuard screening and have declined a colonoscopy in spite of being explained the risks and benefits of the colonoscopy in detail, including cancer and death. Please understand that this is test not as sensitive or specific as a colonoscopy and you are still recommended to get a colonoscopy.   If you are NOT medicare please call your insurance company and give them these items to see if they will cover it: 1) CPT code, [phone] 2) Provider is Probation officer 3) [employer] NPI [id number] 4) [place] Tax ID [id number]  Out-of-pocket cost for Cologuard can range from $0 - $649 so please call  You will receive a short call from [place] support center at [employer], when you receive a call they will say they are from [place],  to confirm your mailing address and give you more information.  When they calll you, it will appear on the caller ID as "[employer]" or in some cases only this number will appear, [phone].   [employer] will ship your collection kit directly to you. You will collect a single stool sample in the privacy of your own home, no special preparation required. You will return the kit via [facility] [place] pre-paid shipping or pick-up, in the same box it arrived in. Then I will contact you to discuss your results after I receive them from the laboratory.   If you have any questions or concerns, Cologuard  Customer Support Specialist are available 24 hours a day, 7 days a week at [phone] or go to [email or web address].       Vitamin D goal is between 60-80  Please make sure that you are taking your Vitamin D as directed.   It is very important as a natural anti-inflammatory   helping hair, skin, and nails, as well as reducing stroke and heart attack risk.   It helps your bones and helps with mood.  We want you on at least 5000 IU daily  It also decreases numerous cancer risks so please take it as directed.   Low Vit D is associated with  a 200-300% higher risk for CANCER   and 200-300% higher risk for Hodgeman.    .....................................Marland Kitchen  It is also associated with higher death rate at younger ages,   autoimmune diseases like Rheumatoid arthritis, Lupus, Multiple Sclerosis.     Also many other serious conditions, like depression, Alzheimer's  Dementia, infertility, muscle aches, fatigue, fibromyalgia - just to name a few.  +++++++++++++++++++  Can get liquid vitamin D from [place] here in [place] at  [facility] alternatives [address], [place], [place] [address] Or you can try earth [employer]

## 2017-10-10 LAB — CBC WITH DIFFERENTIAL/PLATELET
BASOS ABS: 38 {cells}/uL (ref 0–200)
BASOS PCT: 0.6 %
EOS ABS: 139 {cells}/uL (ref 15–500)
Eosinophils Relative: 2.2 %
HEMATOCRIT: 44.6 % (ref 35.0–45.0)
HEMOGLOBIN: 15.4 g/dL (ref 11.7–15.5)
Lymphs Abs: 3137 cells/uL (ref 850–3900)
MCH: 30.6 pg (ref 27.0–33.0)
MCHC: 34.5 g/dL (ref 32.0–36.0)
MCV: 88.5 fL (ref 80.0–100.0)
MPV: 8.8 fL (ref 7.5–12.5)
Monocytes Relative: 6.8 %
NEUTROS ABS: 2558 {cells}/uL (ref 1500–7800)
Neutrophils Relative %: 40.6 %
Platelets: 250 10*3/uL (ref 140–400)
RBC: 5.04 10*6/uL (ref 3.80–5.10)
RDW: 12.2 % (ref 11.0–15.0)
Total Lymphocyte: 49.8 %
WBC: 6.3 10*3/uL (ref 3.8–10.8)
WBCMIX: 428 {cells}/uL (ref 200–950)

## 2017-10-10 LAB — HEPATIC FUNCTION PANEL
AG RATIO: 1.3 (calc) (ref 1.0–2.5)
ALKALINE PHOSPHATASE (APISO): 55 U/L (ref 33–130)
ALT: 50 U/L — ABNORMAL HIGH (ref 6–29)
AST: 37 U/L — ABNORMAL HIGH (ref 10–35)
Albumin: 4.2 g/dL (ref 3.6–5.1)
BILIRUBIN DIRECT: 0.1 mg/dL (ref 0.0–0.2)
BILIRUBIN INDIRECT: 0.4 mg/dL (ref 0.2–1.2)
BILIRUBIN TOTAL: 0.5 mg/dL (ref 0.2–1.2)
GLOBULIN: 3.3 g/dL (ref 1.9–3.7)
Total Protein: 7.5 g/dL (ref 6.1–8.1)

## 2017-10-10 LAB — URINALYSIS, ROUTINE W REFLEX MICROSCOPIC
BILIRUBIN URINE: NEGATIVE
HGB URINE DIPSTICK: NEGATIVE
KETONES UR: NEGATIVE
Leukocytes, UA: NEGATIVE
NITRITE: NEGATIVE
Protein, ur: NEGATIVE
Specific Gravity, Urine: 1.025 (ref 1.001–1.03)

## 2017-10-10 LAB — LIPID PANEL
Cholesterol: 225 mg/dL — ABNORMAL HIGH (ref <200)
HDL: 54 mg/dL (ref >50)
LDL Cholesterol (Calc): 143 mg/dL (calc) — ABNORMAL HIGH
Non-HDL Cholesterol (Calc): 171 mg/dL (calc) — ABNORMAL HIGH (ref <130)
TRIGLYCERIDES: 153 mg/dL — AB (ref <150)
Total CHOL/HDL Ratio: 4.2 (calc) (ref <5.0)

## 2017-10-10 LAB — BASIC METABOLIC PANEL WITH GFR
BUN: 12 mg/dL (ref 7–25)
CO2: 29 mmol/L (ref 20–32)
CREATININE: 0.58 mg/dL (ref 0.50–1.05)
Calcium: 9.2 mg/dL (ref 8.6–10.4)
Chloride: 101 mmol/L (ref 98–110)
GFR, EST AFRICAN AMERICAN: 122 mL/min/{1.73_m2} (ref >=60)
GFR, EST NON AFRICAN AMERICAN: 105 mL/min/{1.73_m2} (ref >=60)
GLUCOSE: 162 mg/dL — AB (ref 65–99)
Potassium: 3.7 mmol/L (ref 3.5–5.3)
SODIUM: 138 mmol/L (ref 135–146)

## 2017-10-10 LAB — MICROALBUMIN / CREATININE URINE RATIO
Creatinine, Urine: 116 mg/dL (ref 20–275)
Microalb Creat Ratio: 59 mcg/mg creat — ABNORMAL HIGH (ref <30)
Microalb, Ur: 6.9 mg/dL

## 2017-10-10 LAB — HEMOGLOBIN A1C
EAG (MMOL/L): 9.8 (calc)
Hgb A1c MFr Bld: 7.8 % of total Hgb — ABNORMAL HIGH (ref <5.7)
MEAN PLASMA GLUCOSE: 177 (calc)

## 2017-10-10 LAB — TSH: TSH: 3.01 mIU/L

## 2017-10-10 LAB — MAGNESIUM: Magnesium: 1.7 mg/dL (ref 1.5–2.5)

## 2017-10-10 LAB — VITAMIN D 25 HYDROXY (VIT D DEFICIENCY, FRACTURES): Vit D, 25-Hydroxy: 26 ng/mL — ABNORMAL LOW (ref 30–100)

## 2017-10-29 ENCOUNTER — Telehealth: Payer: Self-pay | Admitting: Physician Assistant

## 2017-10-29 MED ORDER — DEXAMETHASONE 0.5 MG PO TABS: ORAL_TABLET | ORAL | 0 refills | Status: DC

## 2017-10-29 MED ORDER — LEVOFLOXACIN 500 MG PO TABS: 500.0000 mg | ORAL_TABLET | Freq: Every day | ORAL | 0 refills | Status: DC

## 2017-10-29 NOTE — Telephone Encounter (Signed)
Patient seen 10/30 for regular follow up, had sinus issues then, given doxy states too expensive, has had ear pain, sinus pressure.  Get on decadron and keflex If not better need OV, if worse or worse headache ever need to go to ER/urgent care

## 2017-11-19 ENCOUNTER — Other Ambulatory Visit: Payer: Self-pay | Admitting: Internal Medicine

## 2017-11-19 ENCOUNTER — Encounter: Payer: Self-pay | Admitting: Physician Assistant

## 2017-11-19 MED ORDER — AZITHROMYCIN 250 MG PO TABS: ORAL_TABLET | ORAL | 0 refills | Status: DC

## 2017-11-19 MED ORDER — PREDNISONE 20 MG PO TABS: ORAL_TABLET | ORAL | 0 refills | Status: DC

## 2018-01-17 ENCOUNTER — Ambulatory Visit: Payer: Self-pay | Admitting: Physician Assistant

## 2018-01-28 ENCOUNTER — Encounter: Payer: Self-pay | Admitting: Physician Assistant

## 2018-01-28 ENCOUNTER — Ambulatory Visit: Payer: Self-pay | Admitting: Physician Assistant

## 2018-01-28 ENCOUNTER — Ambulatory Visit (INDEPENDENT_AMBULATORY_CARE_PROVIDER_SITE_OTHER): Payer: 59 | Admitting: Physician Assistant

## 2018-01-28 VITALS — BP 132/72 | HR 93 | Temp 97.8°F | Resp 16 | Ht 70.0 in | Wt 271.8 lb

## 2018-01-28 DIAGNOSIS — E039 Hypothyroidism, unspecified: Secondary | ICD-10-CM

## 2018-01-28 DIAGNOSIS — E785 Hyperlipidemia, unspecified: Secondary | ICD-10-CM

## 2018-01-28 DIAGNOSIS — E119 Type 2 diabetes mellitus without complications: Secondary | ICD-10-CM | POA: Diagnosis not present

## 2018-01-28 DIAGNOSIS — I10 Essential (primary) hypertension: Secondary | ICD-10-CM

## 2018-01-28 MED ORDER — ENALAPRIL MALEATE 10 MG PO TABS: 10.0000 mg | ORAL_TABLET | Freq: Every day | ORAL | 11 refills | Status: DC

## 2018-01-28 MED ORDER — PHENTERMINE HCL 37.5 MG PO TABS: 37.5000 mg | ORAL_TABLET | Freq: Every day | ORAL | 2 refills | Status: DC

## 2018-01-28 NOTE — Progress Notes (Signed)
Assessment and Plan:   Hypertension -Continue medication, monitor blood pressure at home. Continue DASH diet.  Reminder to go to the ER if any CP, SOB, nausea, dizziness, severe HA, changes vision/speech, left arm numbness and tingling and jaw pain. - Will add on lose dose ACE, goal 120/60  Cholesterol -Continue diet and exercise. Check cholesterol.  Declines STatin at this time BUT may be willing to do low dose next OV   Diabetes with diabetic chronic kidney disease -Continue diet and exercise. Check A1C - get on cinnamon  - very apprehensive to get on medications, will work on weight loss  Vitamin D Def - check level and continue medications.   Morbid Obesity with co morbidities - long discussion about weight loss, diet, and exercise - continue phentermine PRN - given information about emotional eating and intermittent eating  Continue diet and meds as discussed. Further disposition pending results of labs. Discussed med's effects and SE's.   Over 30 minutes of exam, counseling, chart review, and critical decision making was performed Future Appointments  Date Time Provider [place]  05/15/2018  8:45 AM Liane Comber, NP [facility] None  10/16/2018 10:00 AM Vicie Mutters, PA-C [facility] None     HPI 52 y.o. female  presents for 3 month follow up on hypertension, cholesterol, diabetes and vitamin D deficiency.   Her blood pressure has been controlled at home, today their BP is BP: 132/72   She does workout. She denies chest pain, shortness of breath, dizziness.  She is not on cholesterol medication and denies myalgias. Her cholesterol is not at goal. The cholesterol last visit was:   Lab Results  Component Value Date   CHOL 225 (H) 10/09/2017   HDL 54 10/09/2017   LDLCALC 148 (H) 04/30/2017   TRIG 153 (H) 10/09/2017   CHOLHDL 4.2 10/09/2017   She has been working on diet and exercise for Diabetes with diabetic chronic kidney disease, she is on bASA, she is not  on ACE/ARB, could not tolerate farxiga, metformin, she does not check sugars at home, and denies paresthesia of the feet, polydipsia, polyuria and visual disturbances. Last A1C was:  Lab Results  Component Value Date   HGBA1C 7.8 (H) 10/09/2017   Lab Results  Component Value Date   GFRNONAA 105 10/09/2017   Patient is on Vitamin D supplement, 5000 IU once daily.    Lab Results  Component Value Date   VD25OH 26 (L) 10/09/2017     BMI is Body mass index is 39 kg/m., she is working on diet and exercise.She has done well with phentermine in the past.  She eats 2 meals a day, sometimes NABS in the morning, she does bored eat at night. She lives off diet pepsi/water, 12 oz pepsi a day and 1 bottle water a day. Wt Readings from Last 3 Encounters:  01/28/18 271 lb 12.8 oz (123.3 kg)  10/09/17 264 lb 4.8 oz (119.9 kg)  04/30/17 270 lb (122.5 kg)    Current Medications:  Current Outpatient Medications on File Prior to Visit  Medication Sig  . cetirizine (ZYRTEC) 10 MG tablet Take 10 mg by mouth daily.  . cyclobenzaprine (FLEXERIL) 10 MG tablet TAKE 1 TABLET (10 MG TOTAL) BY MOUTH AT BEDTIME.   No current facility-administered medications on file prior to visit.     Medical History:  Past Medical History:  Diagnosis Date  . Fibrocystic breast   . Hyperlipidemia   . Hypertension   . Hypothyroidism   . Iron  deficiency anemia   . Obesity    BMI 41  . Prediabetes   . Pulmonary embolism ([facility]) 2006   from BCP  . Vitamin D deficiency    Allergies:  Allergies  Allergen Reactions  . Crestor [Rosuvastatin]     Hair Loss   . Penicillins Rash     Review of Systems:  Review of Systems  Constitutional: Negative.   HENT: Negative.   Eyes: Negative.   Respiratory: Negative.   Cardiovascular: Negative.   Gastrointestinal: Negative.   Genitourinary: Negative.   Musculoskeletal: Negative.   Skin: Negative.     Family history- Review and unchanged Social history- Review and  unchanged Physical Exam: BP 132/72   Pulse 93   Temp 97.8 F (36.6 C)   Resp 16   Ht 5\' 10"  (1.778 m)   Wt 271 lb 12.8 oz (123.3 kg)   SpO2 97%   BMI 39.00 kg/m  Wt Readings from Last 3 Encounters:  01/28/18 271 lb 12.8 oz (123.3 kg)  10/09/17 264 lb 4.8 oz (119.9 kg)  04/30/17 270 lb (122.5 kg)   General Appearance: Well nourished, in no apparent distress. Eyes: PERRLA, EOMs, conjunctiva no swelling or erythema Sinuses: No Frontal/maxillary tenderness ENT/Mouth: Ext aud canals clear, TMs without erythema, bulging. No erythema, swelling, or exudate on post pharynx.  Tonsils not swollen or erythematous. Hearing normal.  Neck: Supple, thyroid normal.  Respiratory: Respiratory effort normal, BS equal bilaterally without rales, rhonchi, wheezing or stridor.  Cardio: RRR with no MRGs. Brisk peripheral pulses without edema.  Abdomen: Soft, + BS.  Non tender, no guarding, rebound, hernias, masses. Lymphatics: Non tender without lymphadenopathy.  Musculoskeletal: Full ROM, 5/5 strength, Normal gait Skin: Warm, dry without rashes, lesions, ecchymosis.  Neuro: Cranial nerves intact. No cerebellar symptoms.  Psych: Awake and oriented X 3, normal affect, Insight and Judgment appropriate.    Vicie Mutters, PA-C 12:53 PM [facility]

## 2018-01-28 NOTE — Patient Instructions (Addendum)
ACE inhibitors are blood pressure medications that protect your heart and kidneys. It can cause two symptoms: The most common symptom is a dry cough/tickle in your throat that can happen the first day you take it or 5 years after you have been taking it. Please call us if you have this and we can switch it to a different medications. The least common side effect is called angioedema which is swelling of your lips and tongue and can cause problems with your breathing. This is a very very rare side effect but very serious. If this happens please stop the medication and go to the ER.    Being dehydrated can hurt your kidneys, cause fatigue, headaches, muscle aches, joint pain, and dry skin/nails so please increase your fluids.   Drink 80-100 oz a day of water, measure it out!  Diet soda leads to weight gain.  We recently discovered that the artifical sugar in the soda stops an enzyme in your stomach that is suppose to signal that your brain is full. So patients that drink a lot of diet soda will never feel full and tend to over eat. So please cut back on diet soda and it can help with your weight loss.    Intermittent fasting is more about strategy than starvation. It's meant to reset your body in different ways, hopefully with fitness and nutrition changes as a result.  Like any big switchover, though, results may vary when it comes down to the individual level. What works for your friends may not work for you, or vice versa. That's why it's helpful to play around with variations on intermittent fasting and healthy habits and find what works best for you.  WHAT IS INTERMITTENT FASTING AND WHY DO IT?  Intermittent fasting doesn't involve specific foods, but rather, a strict schedule regarding when you eat. Also called "time-restricted eating," the tactic has been praised for its contribution to weight loss, improved body composition, and decreased cravings. Preliminary research also suggests it may  be beneficial for glucose tolerance, hormone regulation, better muscle mass and lower body fat.  Part of its appeal is the simplicity of the effort. Unlike some other trends, there's no calculations to intermittent fasting.  You simply eat within a certain block of time, usually a window of 8-10 hours. In the other big block of time - about 14-16 hours, including when you're asleep - you don't eat anything, not even snacks. You can drink water, coffee, tea or any other beverage that doesn't have calories.  For example, if you like having a late dinner, you might skip breakfast and have your first meal at noon and your last meal of the day at 8 p.m., and then not eat until noon again the next day.  IDEAS FOR GETTING STARTED  If you're new to the strategy, it may be helpful to eat within the typical circadian rhythm and keep eating within daylight hours. This can be especially beneficial if you're looking at intermittent fasting for weight-loss goals.  So first try only eating between 12pm to 8pm.  Outside of this time you may have water, black coffee, and hot tea. You may not eat it drink anything that has carbs, sugars, OR artificial sugars like diet soda.   Like any major eating and fitness shift, it can take time to find the perfect fit, so don't be afraid to experiment with different options - including ditching intermittent fasting altogether if it's simply not for you. But if it  is, you may be surprised by some of the benefits that come along with the strategy.  Are you an emotional eater? Do you eat more when you're feeling stressed? Do you eat when you're not hungry or when you're full? Do you eat to feel better (to calm and soothe yourself when you're sad, mad, bored, anxious, etc.)? Do you reward yourself with food? Do you regularly eat until you've stuffed yourself? Does food make you feel safe? Do you feel like food is a friend? Do you feel powerless or out of control around  food?  If you answered yes to some of these questions than it is likely that you are an emotional eater. This is normally a learned behavior and can take time to first recognize the signs and second BREAK THE HABIT. But here is more information and tips to help.   The difference between emotional hunger and physical hunger Emotional hunger can be powerful, so it's easy to mistake it for physical hunger. But there are clues you can look for to help you tell physical and emotional hunger apart.  Emotional hunger comes on suddenly. It hits you in an instant and feels overwhelming and urgent. Physical hunger, on the other hand, comes on more gradually. The urge to eat doesn't feel as dire or demand instant satisfaction (unless you haven't eaten for a very long time).  Emotional hunger craves specific comfort foods. When you're physically hungry, almost anything sounds good-including healthy stuff like vegetables. But emotional hunger craves junk food or sugary snacks that provide an instant rush. You feel like you need cheesecake or pizza, and nothing else will do.  Emotional hunger often leads to mindless eating. Before you know it, you've eaten a whole bag of chips or an entire pint of ice cream without really paying attention or fully enjoying it. When you're eating in response to physical hunger, you're typically more aware of what you're doing.  Emotional hunger isn't satisfied once you're full. You keep wanting more and more, often eating until you're uncomfortably stuffed. Physical hunger, on the other hand, doesn't need to be stuffed. You feel satisfied when your stomach is full.  Emotional hunger isn't located in the stomach. Rather than a growling belly or a pang in your stomach, you feel your hunger as a craving you can't get out of your head. You're focused on specific textures, tastes, and smells.  Emotional hunger often leads to regret, guilt, or shame. When you eat to satisfy physical  hunger, you're unlikely to feel guilty or ashamed because you're simply giving your body what it needs. If you feel guilty after you eat, it's likely because you know deep down that you're not eating for nutritional reasons.  Identify your emotional eating triggers What situations, places, or feelings make you reach for the comfort of food? Most emotional eating is linked to unpleasant feelings, but it can also be triggered by positive emotions, such as rewarding yourself for achieving a goal or celebrating a holiday or happy event. Common causes of emotional eating include:  Stuffing emotions - Eating can be a way to temporarily silence or "stuff down" uncomfortable emotions, including anger, fear, sadness, anxiety, loneliness, resentment, and shame. While you're numbing yourself with food, you can avoid the difficult emotions you'd rather not feel.  Boredom or feelings of emptiness - Do you ever eat simply to give yourself something to do, to relieve boredom, or as a way to fill a void in your life? You feel  unfulfilled and empty, and food is a way to occupy your mouth and your time. In the moment, it fills you up and distracts you from underlying feelings of purposelessness and dissatisfaction with your life.  Childhood habits - Think back to your childhood memories of food. Did your parents reward good behavior with ice cream, take you out for pizza when you got a good report card, or serve you sweets when you were feeling sad? These habits can often carry over into adulthood. Or your eating may be driven by nostalgia-for cherished memories of grilling burgers in the backyard with your dad or baking and eating cookies with your mom.  Social influences - Getting together with other people for a meal is a great way to relieve stress, but it can also lead to overeating. It's easy to overindulge simply because the food is there or because everyone else is eating. You may also overeat in social situations  out of nervousness. Or perhaps your family or circle of friends encourages you to overeat, and it's easier to go along with the group.  Stress - Ever notice how stress makes you hungry? It's not just in your mind. When stress is chronic, as it so often is in our chaotic, fast-paced world, your body produces high levels of the stress hormone, cortisol. Cortisol triggers cravings for salty, sweet, and fried foods-foods that give you a burst of energy and pleasure. The more uncontrolled stress in your life, the more likely you are to turn to food for emotional relief.  Find other ways to feed your feelings If you don't know how to manage your emotions in a way that doesn't involve food, you won't be able to control your eating habits for very long. Diets so often fail because they offer logical nutritional advice which only works if you have conscious control over your eating habits. It doesn't work when emotions hijack the process, demanding an immediate payoff with food.  In order to stop emotional eating, you have to find other ways to fulfill yourself emotionally. It's not enough to understand the cycle of emotional eating or even to understand your triggers, although that's a huge first step. You need alternatives to food that you can turn to for emotional fulfillment.  Alternatives to emotional eating If you're depressed or lonely, call someone who always makes you feel better, play with your dog or cat, or look at a favorite photo or cherished memento.  If you're anxious, expend your nervous energy by dancing to your favorite song, squeezing a stress ball, or taking a brisk walk.  If you're exhausted, treat yourself with a hot cup of tea, take a bath, light some scented candles, or wrap yourself in a warm blanket.  If you're bored, read a good book, watch a comedy show, explore the outdoors, or turn to an activity you enjoy (woodworking, playing the guitar, shooting hoops, scrapbooking,  etc.).  What is mindful eating? Mindful eating is a practice that develops your awareness of eating habits and allows you to pause between your triggers and your actions. Most emotional eaters feel powerless over their food cravings. When the urge to eat hits, you feel an almost unbearable tension that demands to be fed, right now. Because you've tried to resist in the past and failed, you believe that your willpower just isn't up to snuff. But the truth is that you have more power over your cravings than you think.  Take 5 before you give in to a  craving Emotional eating tends to be automatic and virtually mindless. Before you even realize what you're doing, you've reached for a tub of ice cream and polished off half of it. But if you can take a moment to pause and reflect when you're hit with a craving, you give yourself the opportunity to make a different decision.  Can you put off eating for five minutes? Or just start with one minute. Don't tell yourself you can't give in to the craving; remember, the forbidden is extremely tempting. Just tell yourself to wait.  While you're waiting, check in with yourself. How are you feeling? What's going on emotionally? Even if you end up eating, you'll have a better understanding of why you did it. This can help you set yourself up for a different response next time.  How to practice mindful eating Eating while you're also doing other things-such as watching TV, driving, or playing with your phone-can prevent you from fully enjoying your food. Since your mind is elsewhere, you may not feel satisfied or continue eating even though you're no longer hungry. Eating more mindfully can help focus your mind on your food and the pleasure of a meal and curb overeating.   Eat your meals in a calm place with no distractions, aside from any dining companions.  Try eating with your non-dominant hand or using chopsticks instead of a knife and fork. Eating in such a  non-familiar way can slow down how fast you eat and ensure your mind stays focused on your food.  Allow yourself enough time not to have to rush your meal. Set a timer for 20 minutes and pace yourself so you spend at least that much time eating.  Take small bites and chew them well, taking time to notice the different flavors and textures of each mouthful.  Put your utensils down between bites. Take time to consider how you feel-hungry, satiated-before picking up your utensils again.  Try to stop eating before you are full.It takes time for the signal to reach your brain that you've had enough. Don't feel obligated to always clean your plate.  When you've finished your food, take a few moments to assess if you're really still hungry before opting for an extra serving or dessert.  Learn to accept your feelings-even the bad ones  While it may seem that the core problem is that you're powerless over food, emotional eating actually stems from feeling powerless over your emotions. You don't feel capable of dealing with your feelings head on, so you avoid them with food.  Recommended reading  Mini Habits for weight loss  Healthy Eating: A guide to the new nutrition - [place] Report  10 Tips for Mindful Eating - How mindfulness can help you fully enjoy a meal and the experience of eating-with moderation and restraint. ([place])  Weight Loss: Gain Control of Emotional Eating - Tips to regain control of your eating habits. [facility])  Why Stress Causes People to Overeat -Tips on controlling stress eating. ([place])  Mindful Eating Meditations -Free online mindfulness meditations. ([facility] [employer])

## 2018-01-29 LAB — CBC WITH DIFFERENTIAL/PLATELET
Basophils Absolute: 59 cells/uL (ref 0–200)
Basophils Relative: 0.8 %
EOS PCT: 2 %
Eosinophils Absolute: 148 cells/uL (ref 15–500)
HCT: 45.7 % — ABNORMAL HIGH (ref 35.0–45.0)
Hemoglobin: 16.2 g/dL — ABNORMAL HIGH (ref 11.7–15.5)
Lymphs Abs: 3537 cells/uL (ref 850–3900)
MCH: 31 pg (ref 27.0–33.0)
MCHC: 35.4 g/dL (ref 32.0–36.0)
MCV: 87.4 fL (ref 80.0–100.0)
MPV: 8.8 fL (ref 7.5–12.5)
Monocytes Relative: 5.3 %
NEUTROS PCT: 44.1 %
Neutro Abs: 3263 cells/uL (ref 1500–7800)
Platelets: 279 10*3/uL (ref 140–400)
RBC: 5.23 10*6/uL — AB (ref 3.80–5.10)
RDW: 12 % (ref 11.0–15.0)
TOTAL LYMPHOCYTE: 47.8 %
WBC mixed population: 392 cells/uL (ref 200–950)
WBC: 7.4 10*3/uL (ref 3.8–10.8)

## 2018-01-29 LAB — BASIC METABOLIC PANEL WITH GFR
BUN: 10 mg/dL (ref 7–25)
CALCIUM: 9.8 mg/dL (ref 8.6–10.4)
CHLORIDE: 102 mmol/L (ref 98–110)
CO2: 30 mmol/L (ref 20–32)
Creat: 0.79 mg/dL (ref 0.50–1.05)
GFR, EST AFRICAN AMERICAN: 99 mL/min/{1.73_m2} (ref >=60)
GFR, Est Non African American: 85 mL/min/{1.73_m2} (ref >=60)
Glucose, Bld: 213 mg/dL — ABNORMAL HIGH (ref 65–99)
Potassium: 4.7 mmol/L (ref 3.5–5.3)
SODIUM: 137 mmol/L (ref 135–146)

## 2018-01-29 LAB — LIPID PANEL
CHOLESTEROL: 244 mg/dL — AB (ref <200)
HDL: 52 mg/dL (ref >50)
LDL CHOLESTEROL (CALC): 153 mg/dL — AB
NON-HDL CHOLESTEROL (CALC): 192 mg/dL — AB (ref <130)
Total CHOL/HDL Ratio: 4.7 (calc) (ref <5.0)
Triglycerides: 229 mg/dL — ABNORMAL HIGH (ref <150)

## 2018-01-29 LAB — HEPATIC FUNCTION PANEL
AG RATIO: 1.4 (calc) (ref 1.0–2.5)
ALT: 49 U/L — ABNORMAL HIGH (ref 6–29)
AST: 34 U/L (ref 10–35)
Albumin: 4.3 g/dL (ref 3.6–5.1)
Alkaline phosphatase (APISO): 54 U/L (ref 33–130)
BILIRUBIN INDIRECT: 0.4 mg/dL (ref 0.2–1.2)
BILIRUBIN TOTAL: 0.5 mg/dL (ref 0.2–1.2)
Bilirubin, Direct: 0.1 mg/dL (ref 0.0–0.2)
GLOBULIN: 3.1 g/dL (ref 1.9–3.7)
Total Protein: 7.4 g/dL (ref 6.1–8.1)

## 2018-01-29 LAB — HEMOGLOBIN A1C
Hgb A1c MFr Bld: 10.4 % of total Hgb — ABNORMAL HIGH (ref <5.7)
Mean Plasma Glucose: 252 (calc)
eAG (mmol/L): 13.9 (calc)

## 2018-01-29 LAB — TSH: TSH: 2.53 mIU/L

## 2018-02-19 ENCOUNTER — Encounter: Payer: Self-pay | Admitting: *Deleted

## 2018-04-02 ENCOUNTER — Other Ambulatory Visit: Payer: Self-pay

## 2018-04-02 MED ORDER — ENALAPRIL MALEATE 10 MG PO TABS: 10.0000 mg | ORAL_TABLET | Freq: Every day | ORAL | 1 refills | Status: DC

## 2018-05-15 ENCOUNTER — Ambulatory Visit: Payer: Self-pay | Admitting: Adult Health

## 2018-05-27 ENCOUNTER — Ambulatory Visit: Payer: Self-pay | Admitting: Adult Health

## 2018-06-17 NOTE — Progress Notes (Signed)
FOLLOW UP  Assessment and Plan:   Hypertension Restart lisinopril 20 mg daily, recheck in 1 month with BMP/GFR Monitor blood pressure at home; patient to call if consistently greater than 130/80 Continue DASH diet.   Reminder to go to the ER if any CP, SOB, nausea, dizziness, severe HA, changes vision/speech, left arm numbness and tingling and jaw pain.  Cholesterol Currently above goal and not on treatment; as starting several new medications today, will defer starting an agent at this time, patient will work on diet Continue low cholesterol diet and exercise.  Check lipid panel.   Diabetes - poorly controlled Not currently on treatment duesevere patient resistance to medication and preference to work on lifestyle After extended discussion of options she is agreeable to starting metformin, and she will schedule an appointment with nutritionist at work to discuss lifestyle at length Continue diet and exercise.  Perform daily foot/skin check, notify office of any concerning changes.  Check A1C  Obesity with co morbidities Long discussion about weight loss, diet, and exercise Recommended diet heavy in fruits and veggies and low in animal meats, cheeses, and dairy products, appropriate calorie intake Discussed ideal weight for height  Cutting down on nabs, increase water Will follow up in 3 months  Vitamin D Def Below goal at last visit; she has started 5000 IU daily Continue to recommend supplementation for goal of 70-100 Defer Vit D level per patient request  Continue diet and meds as discussed. Further disposition pending results of labs. Discussed med's effects and SE's.   Over 30 minutes of exam, counseling, chart review, and critical decision making was performed.   Future Appointments  Date Time Provider [place]  10/16/2018 10:00 AM Vicie Mutters, PA-C [id number] None     ----------------------------------------------------------------------------------------------------------------------  HPI 52 y.o. female  presents for 3 month follow up on hypertension, cholesterol, diabetes, morbid obesity and vitamin D deficiency.   BMI is Body mass index is 39.03 kg/m., she is working on diet and exercise; she reports she has increased her water intake, walking daily, increasing vegetables and baking rather than frying. She stopped phentermine as doesn't want to use medications for weight loss.  Wt Readings from Last 3 Encounters:  06/18/18 272 lb (123.4 kg)  01/28/18 271 lb 12.8 oz (123.3 kg)  10/09/17 264 lb 4.8 oz (119.9 kg)   She has not been checkiing BP at home, today their BP is BP: (!) 150/102  She does workout. She denies chest pain, shortness of breath, dizziness.   She is not on cholesterol medication and denies myalgias. Her cholesterol is not at goal. The cholesterol last visit was:   Lab Results  Component Value Date   CHOL 244 (H) 01/28/2018   HDL 52 01/28/2018   LDLCALC 153 (H) 01/28/2018   TRIG 229 (H) 01/28/2018   CHOLHDL 4.7 01/28/2018    She has been working on diet and exercise for T2 diabetes, she is very resistant to starting on medications, has not tolerated farxiga and metformin in the past, opted to work on lifestyle at last visit and did not present for 1 month follow up for diabetes education visit despite recommendation. She denies foot ulcerations, increased appetite, nausea, paresthesia of the feet, polydipsia, polyuria, visual disturbances, vomiting and weight loss. She has a meter at home but has not been checking. Last A1C in the office was:  Lab Results  Component Value Date   HGBA1C 10.4 (H) 01/28/2018   Patient is not on Vitamin D supplement.  Lab Results  Component Value Date   VD25OH 26 (L) 10/09/2017       Current Medications:  Current Outpatient Medications on File Prior to Visit  Medication Sig  . cetirizine  (ZYRTEC) 10 MG tablet Take 10 mg by mouth daily.  . cyclobenzaprine (FLEXERIL) 10 MG tablet TAKE 1 TABLET (10 MG TOTAL) BY MOUTH AT BEDTIME. (Patient not taking: Reported on 06/18/2018)  . enalapril (VASOTEC) 10 MG tablet Take 1 tablet (10 mg total) by mouth daily. (Patient not taking: Reported on 06/18/2018)  . phentermine (ADIPEX-P) 37.5 MG tablet Take 1 tablet (37.5 mg total) by mouth daily before breakfast. (Patient not taking: Reported on 06/18/2018)   No current facility-administered medications on file prior to visit.      Allergies:  Allergies  Allergen Reactions  . Crestor [Rosuvastatin]     Hair Loss   . Penicillins Rash     Medical History:  Past Medical History:  Diagnosis Date  . Fibrocystic breast   . Hyperlipidemia   . Hypertension   . Hypothyroidism   . Iron deficiency anemia   . Obesity    BMI 41  . Prediabetes   . Pulmonary embolism ([place]) 2006   from BCP  . Vitamin D deficiency    Family history- Reviewed and unchanged Social history- Reviewed and unchanged   Review of Systems:  Review of Systems  Constitutional: Negative for malaise/fatigue and weight loss.  HENT: Negative for hearing loss and tinnitus.   Eyes: Negative for blurred vision and double vision.  Respiratory: Negative for cough, shortness of breath and wheezing.   Cardiovascular: Negative for chest pain, palpitations, orthopnea, claudication and leg swelling.  Gastrointestinal: Negative for abdominal pain, blood in stool, constipation, diarrhea, heartburn, melena, nausea and vomiting.  Genitourinary: Negative.   Musculoskeletal: Negative for joint pain and myalgias.  Skin: Negative for rash.  Neurological: Negative for dizziness, tingling, sensory change, weakness and headaches.  Endo/Heme/Allergies: Negative for polydipsia.  Psychiatric/Behavioral: Negative.   All other systems reviewed and are negative.   Physical Exam: BP (!) 150/102   Pulse 87   Temp (!) 97.5 F (36.4 C)   Ht 5'  10" (1.778 m)   Wt 272 lb (123.4 kg)   SpO2 95%   BMI 39.03 kg/m  Wt Readings from Last 3 Encounters:  06/18/18 272 lb (123.4 kg)  01/28/18 271 lb 12.8 oz (123.3 kg)  10/09/17 264 lb 4.8 oz (119.9 kg)   General Appearance: Well nourished, in no apparent distress. Eyes: PERRLA, EOMs, conjunctiva no swelling or erythema Sinuses: No Frontal/maxillary tenderness ENT/Mouth: Ext aud canals clear, TMs without erythema, bulging. No erythema, swelling, or exudate on post pharynx.  Tonsils not swollen or erythematous. Hearing normal.  Neck: Supple, thyroid normal.  Respiratory: Respiratory effort normal, BS equal bilaterally without rales, rhonchi, wheezing or stridor.  Cardio: RRR with no MRGs. Brisk peripheral pulses without edema.  Abdomen: Soft, + BS.  Non tender, no guarding, rebound, hernias, masses. Lymphatics: Non tender without lymphadenopathy.  Musculoskeletal: Full ROM, 5/5 strength, Normal gait Skin: Warm, dry without rashes, lesions, ecchymosis.  Neuro: Cranial nerves intact. No cerebellar symptoms.  Psych: Awake and oriented X 3, normal affect, Insight and Judgment appropriate.    Izora Ribas, NP 9:34 AM [facility]

## 2018-06-18 ENCOUNTER — Ambulatory Visit (INDEPENDENT_AMBULATORY_CARE_PROVIDER_SITE_OTHER): Payer: 59 | Admitting: Adult Health

## 2018-06-18 ENCOUNTER — Encounter: Payer: Self-pay | Admitting: Adult Health

## 2018-06-18 VITALS — BP 150/102 | HR 87 | Temp 97.5°F | Ht 70.0 in | Wt 272.0 lb

## 2018-06-18 DIAGNOSIS — D509 Iron deficiency anemia, unspecified: Secondary | ICD-10-CM | POA: Diagnosis not present

## 2018-06-18 DIAGNOSIS — E559 Vitamin D deficiency, unspecified: Secondary | ICD-10-CM

## 2018-06-18 DIAGNOSIS — E119 Type 2 diabetes mellitus without complications: Secondary | ICD-10-CM

## 2018-06-18 DIAGNOSIS — Z79899 Other long term (current) drug therapy: Secondary | ICD-10-CM

## 2018-06-18 DIAGNOSIS — E785 Hyperlipidemia, unspecified: Secondary | ICD-10-CM

## 2018-06-18 DIAGNOSIS — I10 Essential (primary) hypertension: Secondary | ICD-10-CM | POA: Diagnosis not present

## 2018-06-18 MED ORDER — LISINOPRIL 20 MG PO TABS: 20.0000 mg | ORAL_TABLET | Freq: Every day | ORAL | 1 refills | Status: DC

## 2018-06-18 MED ORDER — METFORMIN HCL ER 500 MG PO TB24: ORAL_TABLET | ORAL | 1 refills | Status: DC

## 2018-06-18 NOTE — Patient Instructions (Addendum)
Avoid sugar and white flour products (crackers, etc) for your diabetes  Lisinopril 20 mg daily - check blood pressure - goal is <130/80, increase to 40 mg if not at goal in 2 weeks   Metformin 500 mg at night with dinner - if tolerating well, try adding a second dose at lunch with your meal  Work up to 5+ bottles of water daily    Aim for 7+ servings of fruits and vegetables daily  Limit animal fats in diet for cholesterol and heart health - choose grass fed whenever available  Aim for low stress - take time to unwind and care for your mental health  Aim for 150 min of moderate intensity exercise weekly for heart health, and weights twice weekly for bone health  Aim for 7-9 hours of sleep daily      When it comes to diets, agreement about the perfect plan isn't easy to find, even among the experts. Experts at the [employer] developed an idea known as the Healthy Eating Plate. Just imagine a plate divided into logical, healthy portions.  The emphasis is on diet quality:  Load up on vegetables and fruits - one-half of your plate: Aim for color and variety, and remember that potatoes don't count.  Go for whole grains - one-quarter of your plate: Whole wheat, barley, wheat berries, quinoa, oats, brown rice, and foods made with them. If you want pasta, go with whole wheat pasta.  Protein power - one-quarter of your plate: Fish, chicken, beans, and nuts are all healthy, versatile protein sources. Limit red meat.  The diet, however, does go beyond the plate, offering a few other suggestions.  Use healthy plant oils, such as olive, canola, soy, corn, sunflower and peanut. Check the labels, and avoid partially hydrogenated oil, which have unhealthy trans fats.  If you're thirsty, drink water. Coffee and tea are good in moderation, but skip sugary drinks and limit milk and dairy products to one or two daily servings.  The type of carbohydrate in the diet is more  important than the amount. Some sources of carbohydrates, such as vegetables, fruits, whole grains, and beans-are healthier than others.  Finally, stay active.   Metformin extended-release tablets What is this medicine? METFORMIN (met FOR min) is used to treat type 2 diabetes. It helps to control blood sugar. Treatment is combined with diet and exercise. This medicine can be used alone or with other medicines for diabetes. This medicine may be used for other purposes; ask your health care provider or pharmacist if you have questions. COMMON BRAND NAME(S): Fortamet, Glucophage XR, Glumetza What should I tell my health care provider before I take this medicine? They need to know if you have any of these conditions: -anemia -dehydration -heart disease -frequently drink alcohol-containing beverages -kidney disease -liver disease -polycystic ovary syndrome -serious infection or injury -vomiting -an unusual or allergic reaction to metformin, other medicines, foods, dyes, or preservatives -pregnant or trying to get pregnant -breast-feeding How should I use this medicine? Take this medicine by mouth with a glass of water. Follow the directions on the prescription label. Take this medicine with food. Take your medicine at regular intervals. Do not take your medicine more often than directed. Do not stop taking except on your doctor's advice. Talk to your pediatrician regarding the use of this medicine in children. Special care may be needed. Overdosage: If you think you have taken too much of this medicine contact a poison control center or  emergency room at once. NOTE: This medicine is only for you. Do not share this medicine with others. What if I miss a dose? If you miss a dose, take it as soon as you can. If it is almost time for your next dose, take only that dose. Do not take double or extra doses. What may interact with this medicine? Do not take this medicine with any of the following  medications: -dofetilide -certain contrast medicines given before X-rays, CT scans, MRI, or other procedures This medicine may also interact with the following medications: -acetazolamide -certain antiviral medicines for HIV or AIDS or for hepatitis, like adefovir, dolutegravir, emtricitabine, entecavir, lamivudine, paritaprevir, or tenofovir -cimetidine -cobicistat -crizotinib -dichlorphenamide -digoxin -diuretics -female hormones, like estrogens or progestins and birth control pills -glycopyrrolate -isoniazid -lamotrigine -medicines for blood pressure, heart disease, irregular heart beat -memantine -midodrine -methazolamide -morphine -niacin -phenothiazines like chlorpromazine, mesoridazine, prochlorperazine, thioridazine -phenytoin -procainamide -propantheline -quinidine -quinine -ranitidine -ranolazine -steroid medicines like prednisone or cortisone -stimulant medicines for attention disorders, weight loss, or to stay awake -thyroid medicines -topiramate -trimethoprim -trospium -vancomycin -vandetanib -zonisamide This list may not describe all possible interactions. Give your health care provider a list of all the medicines, herbs, non-prescription drugs, or dietary supplements you use. Also tell them if you smoke, drink alcohol, or use illegal drugs. Some items may interact with your medicine. What should I watch for while using this medicine? Visit your doctor or health care professional for regular checks on your progress. A test called the HbA1C (A1C) will be monitored. This is a simple blood test. It measures your blood sugar control over the last 2 to 3 months. You will receive this test every 3 to 6 months. Learn how to check your blood sugar. Learn the symptoms of low and high blood sugar and how to manage them. Always carry a quick-source of sugar with you in case you have symptoms of low blood sugar. Examples include hard sugar candy or glucose tablets. Make  sure others know that you can choke if you eat or drink when you develop serious symptoms of low blood sugar, such as seizures or unconsciousness. They must get medical help at once. Tell your doctor or health care professional if you have high blood sugar. You might need to change the dose of your medicine. If you are sick or exercising more than usual, you might need to change the dose of your medicine. Do not skip meals. Ask your doctor or health care professional if you should avoid alcohol. Many nonprescription cough and cold products contain sugar or alcohol. These can affect blood sugar. This medicine may cause ovulation in premenopausal women who do not have regular monthly periods. This may increase your chances of becoming pregnant. You should not take this medicine if you become pregnant or think you may be pregnant. Talk with your doctor or health care professional about your birth control options while taking this medicine. Contact your doctor or health care professional right away if think you are pregnant. The tablet shell for some brands of this medicine does not dissolve. This is normal. The tablet shell may appear whole in the stool. This is not a cause for concern. If you are going to need surgery, a MRI, CT scan, or other procedure, tell your doctor that you are taking this medicine. You may need to stop taking this medicine before the procedure. Wear a medical ID bracelet or chain, and carry a card that describes your disease and details of  your medicine and dosage times. What side effects may I notice from receiving this medicine? Side effects that you should report to your doctor or health care professional as soon as possible: -allergic reactions like skin rash, itching or hives, swelling of the face, lips, or tongue -breathing problems -feeling faint or lightheaded, falls -muscle aches or pains -signs and symptoms of low blood sugar such as feeling anxious, confusion, dizziness,  increased hunger, unusually weak or tired, sweating, shakiness, cold, irritable, headache, blurred vision, fast heartbeat, loss of consciousness -slow or irregular heartbeat -unusual stomach pain or discomfort -unusually tired or weak Side effects that usually do not require medical attention (report to your doctor or health care professional if they continue or are bothersome): -diarrhea -headache -heartburn -metallic taste in mouth -nausea -stomach gas, upset This list may not describe all possible side effects. Call your doctor for medical advice about side effects. You may report side effects to FDA at 1-800-FDA-1088. Where should I keep my medicine? Keep out of the reach of children. Store at room temperature between 15 and 30 degrees C (59 and 86 degrees F). Protect from light. Throw away any unused medicine after the expiration date. NOTE: This sheet is a summary. It may not cover all possible information. If you have questions about this medicine, talk to your doctor, pharmacist, or health care provider.  2018 Elsevier/Gold Standard (2016-06-07 15:47:35)    Lisinopril tablets What is this medicine? LISINOPRIL (lyse IN oh pril) is an ACE inhibitor. This medicine is used to treat high blood pressure and heart failure. It is also used to protect the heart immediately after a heart attack. This medicine may be used for other purposes; ask your health care provider or pharmacist if you have questions. COMMON BRAND NAME(S): Prinivil, Zestril What should I tell my health care provider before I take this medicine? They need to know if you have any of these conditions: -diabetes -heart or blood vessel disease -kidney disease -low blood pressure -previous swelling of the tongue, face, or lips with difficulty breathing, difficulty swallowing, hoarseness, or tightening of the throat -an unusual or allergic reaction to lisinopril, other ACE inhibitors, insect venom, foods, dyes, or  preservatives -pregnant or trying to get pregnant -breast-feeding How should I use this medicine? Take this medicine by mouth with a glass of water. Follow the directions on your prescription label. You may take this medicine with or without food. If it upsets your stomach, take it with food. Take your medicine at regular intervals. Do not take it more often than directed. Do not stop taking except on your doctor's advice. Talk to your pediatrician regarding the use of this medicine in children. Special care may be needed. While this drug may be prescribed for children as young as 11 years of age for selected conditions, precautions do apply. Overdosage: If you think you have taken too much of this medicine contact a poison control center or emergency room at once. NOTE: This medicine is only for you. Do not share this medicine with others. What if I miss a dose? If you miss a dose, take it as soon as you can. If it is almost time for your next dose, take only that dose. Do not take double or extra doses. What may interact with this medicine? Do not take this medicine with any of the following medications: -hymenoptera venom -sacubitril; valsartan This medicines may also interact with the following medications: -aliskiren -angiotensin receptor blockers, like losartan or valsartan -certain medicines  for diabetes -diuretics -everolimus -gold compounds -lithium -NSAIDs, medicines for pain and inflammation, like ibuprofen or naproxen -potassium salts or supplements -salt substitutes -sirolimus -temsirolimus This list may not describe all possible interactions. Give your health care provider a list of all the medicines, herbs, non-prescription drugs, or dietary supplements you use. Also tell them if you smoke, drink alcohol, or use illegal drugs. Some items may interact with your medicine. What should I watch for while using this medicine? Visit your doctor or health care professional for  regular check ups. Check your blood pressure as directed. Ask your doctor what your blood pressure should be, and when you should contact him or her. Do not treat yourself for coughs, colds, or pain while you are using this medicine without asking your doctor or health care professional for advice. Some ingredients may increase your blood pressure. Women should inform their doctor if they wish to become pregnant or think they might be pregnant. There is a potential for serious side effects to an unborn child. Talk to your health care professional or pharmacist for more information. Check with your doctor or health care professional if you get an attack of severe diarrhea, nausea and vomiting, or if you sweat a lot. The loss of too much body fluid can make it dangerous for you to take this medicine. You may get drowsy or dizzy. Do not drive, use machinery, or do anything that needs mental alertness until you know how this drug affects you. Do not stand or sit up quickly, especially if you are an older patient. This reduces the risk of dizzy or fainting spells. Alcohol can make you more drowsy and dizzy. Avoid alcoholic drinks. Avoid salt substitutes unless you are told otherwise by your doctor or health care professional. What side effects may I notice from receiving this medicine? Side effects that you should report to your doctor or health care professional as soon as possible: -allergic reactions like skin rash, itching or hives, swelling of the hands, feet, face, lips, throat, or tongue -breathing problems -signs and symptoms of kidney injury like trouble passing urine or change in the amount of urine -signs and symptoms of increased potassium like muscle weakness; chest pain; or fast, irregular heartbeat -signs and symptoms of liver injury like dark yellow or brown urine; general ill feeling or flu-like symptoms; light-colored stools; loss of appetite; nausea; right upper belly pain; unusually weak  or tired; yellowing of the eyes or skin -signs and symptoms of low blood pressure like dizziness; feeling faint or lightheaded, falls; unusually weak or tired -stomach pain with or without nausea and vomiting Side effects that usually do not require medical attention (report to your doctor or health care professional if they continue or are bothersome): -changes in taste -cough -dizziness -fever -headache -sensitivity to light This list may not describe all possible side effects. Call your doctor for medical advice about side effects. You may report side effects to FDA at 1-800-FDA-1088. Where should I keep my medicine? Keep out of the reach of children. Store at room temperature between 15 and 30 degrees C (59 and 86 degrees F). Protect from moisture. Keep container tightly closed. Throw away any unused medicine after the expiration date. NOTE: This sheet is a summary. It may not cover all possible information. If you have questions about this medicine, talk to your doctor, pharmacist, or health care provider.  2018 Elsevier/Gold Standard (2016-01-17 12:52:35)

## 2018-06-19 LAB — HEMOGLOBIN A1C
EAG (MMOL/L): 13.6 (calc)
Hgb A1c MFr Bld: 10.2 % of total Hgb — ABNORMAL HIGH (ref <5.7)
Mean Plasma Glucose: 246 (calc)

## 2018-06-19 LAB — COMPLETE METABOLIC PANEL WITH GFR
AG Ratio: 1.3 (calc) (ref 1.0–2.5)
ALBUMIN MSPROF: 4.3 g/dL (ref 3.6–5.1)
ALT: 62 U/L — ABNORMAL HIGH (ref 6–29)
AST: 43 U/L — AB (ref 10–35)
Alkaline phosphatase (APISO): 54 U/L (ref 33–130)
BUN: 11 mg/dL (ref 7–25)
CALCIUM: 9.3 mg/dL (ref 8.6–10.4)
CO2: 28 mmol/L (ref 20–32)
CREATININE: 0.76 mg/dL (ref 0.50–1.05)
Chloride: 100 mmol/L (ref 98–110)
GFR, EST NON AFRICAN AMERICAN: 89 mL/min/{1.73_m2} (ref >=60)
GFR, Est African American: 103 mL/min/{1.73_m2} (ref >=60)
Globulin: 3.2 g/dL (calc) (ref 1.9–3.7)
Glucose, Bld: 266 mg/dL — ABNORMAL HIGH (ref 65–99)
Potassium: 4.4 mmol/L (ref 3.5–5.3)
SODIUM: 135 mmol/L (ref 135–146)
TOTAL PROTEIN: 7.5 g/dL (ref 6.1–8.1)
Total Bilirubin: 0.6 mg/dL (ref 0.2–1.2)

## 2018-06-19 LAB — CBC WITH DIFFERENTIAL/PLATELET
BASOS PCT: 0.6 %
Basophils Absolute: 38 cells/uL (ref 0–200)
EOS ABS: 139 {cells}/uL (ref 15–500)
Eosinophils Relative: 2.2 %
HCT: 47.2 % — ABNORMAL HIGH (ref 35.0–45.0)
Hemoglobin: 16.1 g/dL — ABNORMAL HIGH (ref 11.7–15.5)
Lymphs Abs: 3314 cells/uL (ref 850–3900)
MCH: 30.4 pg (ref 27.0–33.0)
MCHC: 34.1 g/dL (ref 32.0–36.0)
MCV: 89.2 fL (ref 80.0–100.0)
MPV: 8.8 fL (ref 7.5–12.5)
Monocytes Relative: 4.4 %
NEUTROS PCT: 40.2 %
Neutro Abs: 2533 cells/uL (ref 1500–7800)
PLATELETS: 282 10*3/uL (ref 140–400)
RBC: 5.29 10*6/uL — ABNORMAL HIGH (ref 3.80–5.10)
RDW: 12.3 % (ref 11.0–15.0)
TOTAL LYMPHOCYTE: 52.6 %
WBC: 6.3 10*3/uL (ref 3.8–10.8)
WBCMIX: 277 {cells}/uL (ref 200–950)

## 2018-06-19 LAB — TSH: TSH: 2.36 mIU/L

## 2018-06-19 LAB — LIPID PANEL
CHOL/HDL RATIO: 5.1 (calc) — AB (ref <5.0)
Cholesterol: 267 mg/dL — ABNORMAL HIGH (ref <200)
HDL: 52 mg/dL (ref >50)
LDL Cholesterol (Calc): 178 mg/dL (calc) — ABNORMAL HIGH
NON-HDL CHOLESTEROL (CALC): 215 mg/dL — AB (ref <130)
TRIGLYCERIDES: 205 mg/dL — AB (ref <150)

## 2018-07-19 ENCOUNTER — Ambulatory Visit (INDEPENDENT_AMBULATORY_CARE_PROVIDER_SITE_OTHER): Payer: 59

## 2018-07-19 VITALS — BP 132/92 | Ht 70.0 in | Wt 273.0 lb

## 2018-07-19 DIAGNOSIS — Z0130 Encounter for examination of blood pressure without abnormal findings: Secondary | ICD-10-CM | POA: Diagnosis not present

## 2018-07-19 NOTE — Progress Notes (Signed)
Pt reports for a BLD pressure check. Today BLD pressure was 132/92 and pt reports she is taking her BLD pressure meds as directed by provider. Please advise pt on what is next.

## 2018-09-20 ENCOUNTER — Ambulatory Visit: Payer: Self-pay | Admitting: Adult Health

## 2018-10-16 ENCOUNTER — Encounter: Payer: Self-pay | Admitting: Physician Assistant

## 2018-11-21 LAB — RESULTS CONSOLE HPV: CHL HPV: NEGATIVE

## 2018-11-21 LAB — HM PAP SMEAR: HM Pap smear: NEGATIVE

## 2018-12-12 ENCOUNTER — Telehealth: Payer: Self-pay

## 2018-12-12 MED ORDER — BENZONATATE 200 MG PO CAPS: 200.0000 mg | ORAL_CAPSULE | Freq: Three times a day (TID) | ORAL | 0 refills | Status: DC | PRN

## 2018-12-12 MED ORDER — PREDNISONE 20 MG PO TABS: ORAL_TABLET | ORAL | 0 refills | Status: DC

## 2018-12-12 MED ORDER — DOXYCYCLINE HYCLATE 100 MG PO CAPS: ORAL_CAPSULE | ORAL | 0 refills | Status: DC

## 2018-12-12 NOTE — Telephone Encounter (Signed)
  I have prescribed I have prescribed Doxycycline 100 mg twice a day for 7 days .  Make sure you are on an allergy pill such as claritin, allegra or zyrtec.  You may use an oral decongestant such as Mucinex D or if you have glaucoma or high blood pressure use plain Mucinex.  This is cough meds that you can take: A prescription cough medication called Tessalon Perles 100mg . You may take 1-2 capsules every 8 hours as needed for your cough.  Prednisone  If you develop worsening sinus pain, fever or notice severe headache and vision changes, or if symptoms are not better after completion of antibiotic, please schedule an appointment with a health care provider. If you are getting worse please go to the ER.

## 2018-12-12 NOTE — Telephone Encounter (Signed)
Patient is complaining of sinus, sore throat for 2 weeks. Has taking Alka Seltzer Plus. Has sinus drainage, pressure, cough with dark brown mucus.

## 2018-12-18 ENCOUNTER — Other Ambulatory Visit: Payer: Self-pay | Admitting: Adult Health

## 2018-12-23 NOTE — Progress Notes (Signed)
Complete Physical  Assessment and Plan: Essential hypertension - continue medications, DASH diet, exercise and monitor at home. Call if greater than 130/80.  - CBC with Differential/Platelet - BASIC METABOLIC PANEL WITH GFR - Hepatic function panel - EKG 12-Lead- DECLINES   Hyperlipidemia  check lipids, decrease fatty foods, increase activity.  - Lipid panel  Diabetes mellitus without complication ([place]) Will add on bASA VERY RESISTANT TO MEDS BUT WEIGHT IS NOT GOING DOWN, WILL CHECK THIS TIME, IF HIGHER WILL BRING BACK TO DISCUSS OPTIONS Follow up 3 months due to DM - Hemoglobin A1c - Urinalysis, Routine w reflex microscopic (not at [facility]) - Microalbumin / creatinine urine ratio - EKG 12-Lead- DECLINES  Hypothyroidism, unspecified hypothyroidism type Off medication, will monitor labs.  - TSH  Morbid obesity, unspecified obesity type ([place]) Obesity with co morbidities- long discussion about weight loss, diet, and exercise   Vitamin D deficiency - Vit D  25 hydroxy (rtn osteoporosis monitoring)  Fibrocystic breast, unspecified laterality GET MGM, will follow up with Dr. Philis Pique  Encounter for general adult medical examination with abnormal findings Get MGM from GYN  Colonoscopy-  PATIENT STATES WILL GET AFTER 65 IN APRIL  BMI 37.0-37.9, adult Morbid Obesity with co morbidities - long discussion about weight loss, diet, and exercise   Discussed med's effects and SE's. Screening labs and tests as requested with regular follow-up as recommended. Future Appointments  Date Time Provider [place]  01/05/2020 10:00 AM Vicie Mutters, PA-C [id number] None    HPI  53 y.o. female  presents for a complete physical.  Her blood pressure has been controlled at home, today their BP is BP: 138/88  She does workout, she is walking at work. She denies chest pain, shortness of breath, dizziness.   She is VERY resistant to medications but we had a VERY serious talking about  consequences of blindness, kidney failure, stroke, MI, loss of limb if these numbers are not improved.   She is not on cholesterol medication ,she states with crestor she had hair falling out. Her cholesterol is at goal. The cholesterol last visit was:  Lab Results  Component Value Date   CHOL 267 (H) 06/18/2018   HDL 52 06/18/2018   LDLCALC 178 (H) 06/18/2018   TRIG 205 (H) 06/18/2018   CHOLHDL 5.1 (H) 06/18/2018    She has been working on diet and exercise for prediabetes but has been in the DM range, she is not on ASA, she is on ACE, she can not tolerate farxiga, was on prednisone recently, just on 1 metformin a day, NOT checking her sugars at home but has a monitor from work and will start, and denies paresthesia of the feet, polydipsia, polyuria and visual disturbances. Last A1C in the office was: Lab Results  Component Value Date   HGBA1C 10.2 (H) 06/18/2018   Lab Results  Component Value Date   GFRNONAA 89 06/18/2018   Patient is on Vitamin D supplement, 5000 a day but not consistenly  Lab Results  Component Value Date   VD25OH 26 (L) 10/09/2017   BMI is Body mass index is 39.99 kg/m., her weight is up again. She will drink diet pepsi but drinking more water. Work has been stressful, still working at [employer].  Wt Readings from Last 3 Encounters:  12/25/18 270 lb 12.8 oz (122.8 kg)  07/19/18 273 lb (123.8 kg)  06/18/18 272 lb (123.4 kg)   She is not on thyroid medication. Her medication was not changed last  visit.   Lab Results  Component Value Date   TSH 2.36 06/18/2018   Current Medications:  Current Outpatient Medications on File Prior to Visit  Medication Sig  . cetirizine (ZYRTEC) 10 MG tablet Take 10 mg by mouth daily.  . Cholecalciferol 5000 units capsule Take 5,000 Units by mouth daily.  Marland Kitchen lisinopril (PRINIVIL,ZESTRIL) 20 MG tablet TAKE 1 TABLET BY MOUTH EVERY DAY  . metFORMIN (GLUCOPHAGE-XR) 500 MG 24 hr tablet Start by taking 1 tab with  dinner.   No current facility-administered medications on file prior to visit.     Health Maintenance:   Immunization History  Administered Date(s) Administered  . Pneumococcal-Unspecified 12/12/2003  . Tdap 06/10/2012   Tetanus: 2013 Pneumovax: 2005 Prevnar 13: NA Flu vaccine: declines Zostavax: NA  LMP: No LMP recorded. Patient is postmenopausal.  Pap: Dec 2019 follows with [facility] Dec 2017 normal, neg HPV MGM: Dec 2019 Dr. Philis Pique  DEXA: N/A Colonoscopy: DUE will get in April after age 30 EGD:  N/A Last Dental Exam: Dental works Last Eye Exam: 5 years ago but states no issues at this time NEEDS  Patient Care Team: Unk Pinto, MD as PCP - General (Internal Medicine)  Medical History:  Past Medical History:  Diagnosis Date  . Fibrocystic breast   . Hyperlipidemia   . Hypertension   . Hypothyroidism   . Iron deficiency anemia   . Obesity    BMI 41  . Prediabetes   . Pulmonary embolism ([place]) 2006   from BCP  . Vitamin D deficiency    Allergies Allergies  Allergen Reactions  . Crestor [Rosuvastatin]     Hair Loss   . Penicillins Rash    SURGICAL HISTORY She  has a past surgical history that includes Tubal ligation (Bilateral); Tonsillectomy and adenoidectomy; Ablation (2006); and Thyroid surgery.   FAMILY HISTORY Her family history includes Cancer in her mother; Diabetes in her father; Osteoporosis in her mother.  SOCIAL HISTORY She  reports that she has never smoked. She has never used smokeless tobacco. She reports that she does not drink alcohol or use drugs.  Review of Systems  Constitutional: Positive for malaise/fatigue. Negative for chills, diaphoresis, fever and weight loss.  HENT: Negative.        + snoring, declines sleep study at this time due to cost  Eyes: Negative.  Negative for blurred vision.  Respiratory: Negative.  Negative for cough, hemoptysis, shortness of breath and wheezing.   Cardiovascular: Negative for chest pain,  palpitations, orthopnea, claudication, leg swelling and PND.  Gastrointestinal: Positive for heartburn (depending on what she eats). Negative for abdominal pain, blood in stool, constipation, diarrhea, melena, nausea and vomiting.  Genitourinary: Positive for frequency (on prednisone). Negative for dysuria, flank pain, hematuria and urgency.  Musculoskeletal: Positive for myalgias (varicose veins bilateral legs). Negative for falls.  Skin: Negative.   Neurological: Negative.  Negative for weakness.  Psychiatric/Behavioral: Negative.  Negative for depression and suicidal ideas. The patient does not have insomnia.     Physical Exam: Estimated body mass index is 39.99 kg/m as calculated from the following:   Height as of this encounter: 5\' 9"  (1.753 m).   Weight as of this encounter: 270 lb 12.8 oz (122.8 kg). BP 138/88   Pulse 96   Temp (!) 97.5 F (36.4 C) (Temporal)   Resp 14   Ht 5\' 9"  (1.753 m)   Wt 270 lb 12.8 oz (122.8 kg)   BMI 39.99 kg/m  General Appearance: Well nourished, in  no apparent distress.  Eyes: PERRLA, EOMs, conjunctiva no swelling or erythema, normal fundi and vessels.  Sinuses: No Frontal/maxillary tenderness  ENT/Mouth: Ext aud canals clear, normal light reflex with TMs without erythema, bulging. Good dentition. No erythema, swelling, or exudate on post pharynx. Tonsils not swollen or erythematous. Hearing normal.  Neck: Supple, thyroid normal. No bruits  Respiratory: Respiratory effort normal, BS equal bilaterally without rales, rhonchi, wheezing or stridor.  Cardio: RRR without murmurs, rubs or gallops. Brisk peripheral pulses without edema.  Chest: symmetric, with normal excursions and percussion.  Breasts: defer Abdomen: Soft, nontender, obese, mild hepatomegaly,  no guarding, rebound, hernias, masses, or organomegaly.  Lymphatics: Non tender without lymphadenopathy.  Genitourinary: defer Musculoskeletal: Full ROM all peripheral extremities,5/5 strength, and  normal gait.  Skin: Left chest with scaly erythematous nodule. Warm, dry without rashes, lesions, ecchymosis. Neuro: Cranial nerves intact, reflexes equal bilaterally. Normal muscle tone, no cerebellar symptoms. Sensation intact.  Psych: Awake and oriented X 3, normal affect, Insight and Judgment appropriate.   EKG: WNL no changes.   Vicie Mutters 11:22 AM [facility]

## 2018-12-25 ENCOUNTER — Ambulatory Visit (INDEPENDENT_AMBULATORY_CARE_PROVIDER_SITE_OTHER): Payer: 59 | Admitting: Physician Assistant

## 2018-12-25 VITALS — BP 138/88 | HR 96 | Temp 97.5°F | Resp 14 | Ht 69.0 in | Wt 270.8 lb

## 2018-12-25 DIAGNOSIS — N6019 Diffuse cystic mastopathy of unspecified breast: Secondary | ICD-10-CM

## 2018-12-25 DIAGNOSIS — I10 Essential (primary) hypertension: Secondary | ICD-10-CM

## 2018-12-25 DIAGNOSIS — Z0000 Encounter for general adult medical examination without abnormal findings: Secondary | ICD-10-CM | POA: Diagnosis not present

## 2018-12-25 DIAGNOSIS — D509 Iron deficiency anemia, unspecified: Secondary | ICD-10-CM

## 2018-12-25 DIAGNOSIS — E785 Hyperlipidemia, unspecified: Secondary | ICD-10-CM

## 2018-12-25 DIAGNOSIS — Z79899 Other long term (current) drug therapy: Secondary | ICD-10-CM

## 2018-12-25 DIAGNOSIS — E119 Type 2 diabetes mellitus without complications: Secondary | ICD-10-CM

## 2018-12-25 DIAGNOSIS — E559 Vitamin D deficiency, unspecified: Secondary | ICD-10-CM

## 2018-12-25 DIAGNOSIS — Z0001 Encounter for general adult medical examination with abnormal findings: Secondary | ICD-10-CM

## 2018-12-25 NOTE — Patient Instructions (Addendum)
GET YOUR EYE EXAM  Diabetes is a very complicated disease...lets simplify it.  An easy way to look at it to understand the complications is if you think of the extra sugar floating in your blood stream as glass shards floating through your blood stream.    Diabetes affects your small vessels first: 1) The glass shards (sugar) scraps down the tiny blood vessels in your eyes and lead to diabetic retinopathy, the leading cause of blindness in the [place]. Diabetes is the leading cause of newly diagnosed adult (58 to 55 years of age) blindness in the [place].  2) The glass shards scratches down the tiny vessels of your legs leading to nerve damage called neuropathy and can lead to amputations of your feet. More than 60% of all non-traumatic amputations of lower limbs occur in people with diabetes.  3) Over time the small vessels in your brain are shredded and closed off, individually this does not cause any problems but over a long period of time many of the small vessels being blocked can lead to Vascular Dementia.   4) Your kidney's are a filter system and have a "net" that keeps certain things in the body and lets bad things out. Sugar shreds this net and leads to kidney damage and eventually failure. Decreasing the sugar that is destroying the net and certain blood pressure medications can help stop or decrease progression of kidney disease. Diabetes was the primary cause of kidney failure in 44 percent of all new cases in 2011.  5) Diabetes also destroys the small vessels in your penis that lead to erectile dysfunction. Eventually the vessels are so damaged that you may not be responsive to cialis or viagra.   Diabetes and your large vessels: Your larger vessels consist of your coronary arteries in your heart and the carotid vessels to your brain. Diabetes or even increased sugars put you at 300% increased risk of heart attack and stroke and this is why.. The sugar scrapes down your large blood  vessels and your body sees this as an internal injury and tries to repair itself. Just like you get a scab on your skin, your platelets will stick to the blood vessel wall trying to heal it. This is why we have diabetics on low dose aspirin daily, this prevents the platelets from sticking and can prevent plaque formation. In addition, your body takes cholesterol and tries to shove it into the open wound. This is why we want your LDL, or bad cholesterol, below 70.   The combination of platelets and cholesterol over 5-10 years forms plaque that can break off and cause a heart attack or stroke.   PLEASE REMEMBER:  Diabetes is preventable! Up to 26 percent of complications and morbidities among individuals with type 2 diabetes can be prevented, delayed, or effectively treated and minimized with regular visits to a health professional, appropriate monitoring and medication, and a healthy diet and lifestyle.   Diabetes or even increased sugars put you at 300% increased risk of heart attack and stroke.  ALSO BEING DIABETIC YOU MAY NOT HAVE ANY PAIN WITH A HEART ATTACK.  Even worse of a chance of no pain if you are a woman.  It is very unlikely that you will have any pain with a heart attack. Likely your symptoms will be very subtle, even for very severe disease.  Your symptoms for a heart attack will likely occur when you exert your self or exercise and include: Shortness of breath Sweating  Nausea Dizziness Fast or irregular heart beats Fatigue   It makes me feel better if my diabetics get their heart rate up with exercise once or twice a week and pay close attention to your body. If there is ANY change in your exercise capacity or if you have symptoms above, please STOP and call 911 or call to come to the office.   PLEASE REMEMBER:  Diabetes is preventable! Up to 9 percent of complications and morbidities among individuals with type 2 diabetes can be prevented, delayed, or effectively treated and  minimized with regular visits to a health professional, appropriate monitoring and medication, and a healthy diet and lifestyle.   Here is some information to help you keep your heart healthy: Move it! - Aim for 30 mins of activity every day. Take it slowly at first. Talk to [place] before starting any new exercise program.   Lose it.  -Body Mass Index (BMI) can indicate if you need to lose weight. A healthy range is 18.5-24.9. For a BMI calculator, go to [email or web address]  Waist Management -Excess abdominal fat is a risk factor for heart disease, diabetes, asthma, stroke and more. Ideal waist circumference is less than 35" for women and less than 40" for men.   Eat Right -focus on fruits, vegetables, whole grains, and meals you make yourself. Avoid foods with trans fat and high sugar/sodium content.   Snooze or Snore? - Loud snoring can be a sign of sleep apnea, a significant risk factor for high blood pressure, heart attach, stroke, and heart arrhythmias.  Kick the habit -Quit Smoking! Avoid second hand smoke. A single cigarette raises your blood pressure for 20 mins and increases the risk of heart attack and stroke for the next 24 hours.   Are Aspirin and Supplements right for you? -Add ENTERIC COATED low dose 81 mg Aspirin daily OR can do every other day if you have easy bruising to protect your heart and head. As well as to reduce risk of Colon Cancer by 20 %, Skin Cancer by 26 % , Melanoma by 46% and Pancreatic cancer by 60%  Say "No to Stress -There may be little you can do about problems that cause stress. However, techniques such as long walks, meditation, and exercise can help you manage it.   Start Now! - Make changes one at a time and set reasonable goals to increase your likelihood of success.     RANGE OF A1C   Your A1C is a measure of your sugar over the past 3 months and is not affected by what you have eaten over the past few days. Diabetes increases your chances of stroke  and heart attack over 300 % and is the leading cause of blindness and kidney failure in the [place]. Please make sure you decrease bad carbs like white bread, white rice, potatoes, corn, soft drinks, pasta, cereals, refined sugars, sweet tea, dried fruits, and fruit juice. Good carbs are okay to eat in moderation like sweet potatoes, brown rice, whole grain pasta/bread, most fruit (except dried fruit) and you can eat as many veggies as you want.   Greater than 6.5 is considered diabetic. Between 6.4 and 5.7 is prediabetic If your A1C is less than 5.7 you are NOT diabetic.  Targets for Glucose Readings: Time of Check Target for patients WITHOUT Diabetes Target for DIABETICS  Before Meals Less than 100  less than 150  Two hours after meals Less than 200  Less than 250  What does your A1C results mean?  Your A1C is a measure of your sugar over the past 3 months   Use this chart as a guide to compare the results of your A1C blood test to your estimated average daily blood sugar:  A1C Range Average Sugar  4.0-6.0% 60-120 mg/dl  6.1-7.0% 121 - 150 mg/dl  7.1-8.0% 151-180 mg/dl  8.1-9.0% 181-210 mg/dl  10.1-11% 211-240 mg/dl  11.1-12.0% 271-300 mg/dl  12.1-13.0% 301-330 mg/dl  13.1-14.0% 331-360 mg/dl  Greater than 14.0% Greater than 360 mg/dl       When it comes to diets, agreement about the perfect plan isn't easy to find, even among the experts. Experts at the [employer] developed an idea known as the Healthy Eating Plate. Just imagine a plate divided into logical, healthy portions.  The emphasis is on diet quality:  Load up on vegetables and fruits - one-half of your plate: Aim for color and variety, and remember that potatoes don't count.  Go for whole grains - one-quarter of your plate: Whole wheat, barley, wheat berries, quinoa, oats, brown rice, and foods made with them. If you want pasta, go with whole wheat pasta.  Protein power - one-quarter  of your plate: Fish, chicken, beans, and nuts are all healthy, versatile protein sources. Limit red meat.  The diet, however, does go beyond the plate, offering a few other suggestions.  Use healthy plant oils, such as olive, canola, soy, corn, sunflower and peanut. Check the labels, and avoid partially hydrogenated oil, which have unhealthy trans fats.  If you're thirsty, drink water. Coffee and tea are good in moderation, but skip sugary drinks and limit milk and dairy products to one or two daily servings.  The type of carbohydrate in the diet is more important than the amount. Some sources of carbohydrates, such as vegetables, fruits, whole grains, and beans-are healthier than others.  Finally, stay active.

## 2018-12-26 LAB — URINALYSIS, ROUTINE W REFLEX MICROSCOPIC
Bilirubin Urine: NEGATIVE
Hgb urine dipstick: NEGATIVE
Ketones, ur: NEGATIVE
LEUKOCYTES UA: NEGATIVE
NITRITE: NEGATIVE
Protein, ur: NEGATIVE
Specific Gravity, Urine: 1.035 (ref 1.001–1.03)
pH: <=5 (ref 5.0–8.0)

## 2018-12-26 LAB — LIPID PANEL
Cholesterol: 213 mg/dL — ABNORMAL HIGH (ref <200)
HDL: 49 mg/dL — AB (ref >50)
LDL Cholesterol (Calc): 124 mg/dL (calc) — ABNORMAL HIGH
Non-HDL Cholesterol (Calc): 164 mg/dL (calc) — ABNORMAL HIGH (ref <130)
Total CHOL/HDL Ratio: 4.3 (calc) (ref <5.0)
Triglycerides: 263 mg/dL — ABNORMAL HIGH (ref <150)

## 2018-12-26 LAB — MICROALBUMIN / CREATININE URINE RATIO
Creatinine, Urine: 85 mg/dL (ref 20–275)
MICROALB/CREAT RATIO: 8 ug/mg{creat} (ref <30)
Microalb, Ur: 0.7 mg/dL

## 2018-12-26 LAB — MAGNESIUM: Magnesium: 1.6 mg/dL (ref 1.5–2.5)

## 2018-12-26 LAB — CBC WITH DIFFERENTIAL/PLATELET
Absolute Monocytes: 655 cells/uL (ref 200–950)
Basophils Absolute: 83 cells/uL (ref 0–200)
Basophils Relative: 0.8 %
Eosinophils Absolute: 125 cells/uL (ref 15–500)
Eosinophils Relative: 1.2 %
HCT: 42.1 % (ref 35.0–45.0)
HEMOGLOBIN: 14.6 g/dL (ref 11.7–15.5)
Lymphs Abs: 4909 cells/uL — ABNORMAL HIGH (ref 850–3900)
MCH: 31.2 pg (ref 27.0–33.0)
MCHC: 34.7 g/dL (ref 32.0–36.0)
MCV: 90 fL (ref 80.0–100.0)
MPV: 9.5 fL (ref 7.5–12.5)
Monocytes Relative: 6.3 %
NEUTROS ABS: 4628 {cells}/uL (ref 1500–7800)
Neutrophils Relative %: 44.5 %
Platelets: 257 10*3/uL (ref 140–400)
RBC: 4.68 10*6/uL (ref 3.80–5.10)
RDW: 11.5 % (ref 11.0–15.0)
Total Lymphocyte: 47.2 %
WBC: 10.4 10*3/uL (ref 3.8–10.8)

## 2018-12-26 LAB — COMPLETE METABOLIC PANEL WITH GFR
AG Ratio: 1.5 (calc) (ref 1.0–2.5)
ALBUMIN MSPROF: 4.1 g/dL (ref 3.6–5.1)
ALKALINE PHOSPHATASE (APISO): 44 U/L (ref 33–130)
ALT: 35 U/L — ABNORMAL HIGH (ref 6–29)
AST: 26 U/L (ref 10–35)
BUN: 16 mg/dL (ref 7–25)
CO2: 27 mmol/L (ref 20–32)
Calcium: 9.5 mg/dL (ref 8.6–10.4)
Chloride: 100 mmol/L (ref 98–110)
Creat: 0.86 mg/dL (ref 0.50–1.05)
GFR, Est African American: 89 mL/min/{1.73_m2} (ref >=60)
GFR, Est Non African American: 77 mL/min/{1.73_m2} (ref >=60)
GLUCOSE: 283 mg/dL — AB (ref 65–99)
Globulin: 2.7 g/dL (calc) (ref 1.9–3.7)
Potassium: 4.4 mmol/L (ref 3.5–5.3)
Sodium: 134 mmol/L — ABNORMAL LOW (ref 135–146)
Total Bilirubin: 0.4 mg/dL (ref 0.2–1.2)
Total Protein: 6.8 g/dL (ref 6.1–8.1)

## 2018-12-26 LAB — VITAMIN D 25 HYDROXY (VIT D DEFICIENCY, FRACTURES): Vit D, 25-Hydroxy: 19 ng/mL — ABNORMAL LOW (ref 30–100)

## 2018-12-26 LAB — TSH: TSH: 2.32 mIU/L

## 2018-12-26 LAB — HEMOGLOBIN A1C
Hgb A1c MFr Bld: 10.5 % of total Hgb — ABNORMAL HIGH (ref <5.7)
Mean Plasma Glucose: 255 (calc)
eAG (mmol/L): 14.1 (calc)

## 2019-02-04 ENCOUNTER — Ambulatory Visit: Payer: Self-pay | Admitting: Physician Assistant

## 2019-02-20 NOTE — Progress Notes (Signed)
Diabetes Education and Follow-Up Visit  53 y.o.female presents for diabetic education. She has Diabetes Mellitus type 2: she is on bASA, and denies paresthesia of the feet, polydipsia, polyuria and visual disturbances.  Last hemoglobin A1c was: Lab Results  Component Value Date   HGBA1C 10.5 (H) 12/25/2018   HGBA1C 10.2 (H) 06/18/2018   HGBA1C 10.4 (H) 01/28/2018    BMI is Body mass index is 40.32 kg/m., she is working on diet and exercise. Wt Readings from Last 3 Encounters:  02/24/19 273 lb (123.8 kg)  12/25/18 270 lb 12.8 oz (122.8 kg)  07/19/18 273 lb (123.8 kg)   Pt is on a regimen of: Metformin once a night, takes after eating.  Has tried farxiga but could not tolerate it due to constipation, willing to re try.  Has tried phentermine and could not tolerate it.   Pt checks her sugars 2 x day for 2 weeks In the evening will be 126/124 before supper.  Her fasting sugar will be 200-300.  Glucometer: free meter through work  Exercise: IS NOT WALKING  Patient does have CKD, STAGE 2 She is on ACE/ARB   Lab Results  Component Value Date   GFRNONAA 77 12/25/2018    Lab Results  Component Value Date   CREATININE 0.86 12/25/2018   BUN 16 12/25/2018   NA 134 (L) 12/25/2018   K 4.4 12/25/2018   CL 100 12/25/2018   CO2 27 12/25/2018    Lab Results  Component Value Date   MICROALBUR 0.7 12/25/2018     She is not on a Statin.  She is not at goal of less than 70.  Lab Results  Component Value Date   CHOL 213 (H) 12/25/2018   HDL 49 (L) 12/25/2018   LDLCALC 124 (H) 12/25/2018   TRIG 263 (H) 12/25/2018   CHOLHDL 4.3 12/25/2018     Problem List has Hyperlipidemia; Vitamin D deficiency; Hypertension; Type 2 diabetes mellitus with hyperlipidemia ([place]); Morbid obesity ([place]); Anemia, iron deficiency; and Fibrocystic breast on their problem list.  Medications Current Outpatient Medications on File Prior to Visit  Medication Sig  . cetirizine (ZYRTEC) 10 MG tablet  Take 10 mg by mouth daily.  . Cholecalciferol 5000 units capsule Take 5,000 Units by mouth daily.  Marland Kitchen lisinopril (PRINIVIL,ZESTRIL) 20 MG tablet TAKE 1 TABLET BY MOUTH EVERY DAY  . metFORMIN (GLUCOPHAGE-XR) 500 MG 24 hr tablet Start by taking 1 tab with dinner.   No current facility-administered medications on file prior to visit.     ROS- see HPI  Physical Exam: Blood pressure 132/76, pulse (!) 101, temperature 97.8 F (36.6 C), height 5\' 9"  (1.753 m), weight 273 lb (123.8 kg), SpO2 96 %. Body mass index is 40.32 kg/m. General Appearance: Well nourished, in no apparent distress. Eyes: PERRLA, EOMs, conjunctiva no swelling or erythema ENT/Mouth: Ext aud canals clear, TMs without erythema, bulging. No erythema, swelling, or exudate on post pharynx.  Tonsils not swollen or erythematous. Hearing normal.  Respiratory: Respiratory effort normal, BS equal bilaterally without rales, rhonchi, wheezing or stridor.  Cardio: RRR with no MRGs. Brisk peripheral pulses without edema.  Abdomen: Soft, + BS.  Non tender, no guarding, rebound, hernias, masses. Musculoskeletal: Full ROM, 5/5 strength, normal gait.  Skin: Warm, dry without rashes, lesions, ecchymosis.  Neuro: Cranial nerves intact. Normal muscle tone, no cerebellar symptoms. Sensation intact.    Plan and Assessment: Diabetes Education: Reviewed 'ABCs' of diabetes management (respective goals in parentheses):  A1C (<7), blood pressure (<130/80),  and cholesterol (LDL <70) Eye Exam yearly and Dental Exam every 6 months. Dietary recommendations INCREASE METFORMIN TO 2 AT NIGHT CHECK SUGARS 2 HOURS AFTER FOOD TRY FARXIGA 10 MG Physical Activity recommendations - Strongly advised her to start checking sugars at different times of the day - check 2 times a day, rotating checks - given sugar log and advised how to fill it and to bring it at next appt  - given foot care handout and explained the principles  - given instructions for  hypoglycemia management    Future Appointments  Date Time Provider [place]  04/03/2019 10:30 AM Vicie Mutters, PA-C [facility] None  01/05/2020 10:00 AM Vicie Mutters, PA-C [facility] None

## 2019-02-24 ENCOUNTER — Encounter: Payer: Self-pay | Admitting: Physician Assistant

## 2019-02-24 ENCOUNTER — Other Ambulatory Visit: Payer: Self-pay

## 2019-02-24 ENCOUNTER — Ambulatory Visit (INDEPENDENT_AMBULATORY_CARE_PROVIDER_SITE_OTHER): Payer: 59 | Admitting: Physician Assistant

## 2019-02-24 VITALS — BP 132/76 | HR 101 | Temp 97.8°F | Ht 69.0 in | Wt 273.0 lb

## 2019-02-24 DIAGNOSIS — E1169 Type 2 diabetes mellitus with other specified complication: Secondary | ICD-10-CM | POA: Diagnosis not present

## 2019-02-24 DIAGNOSIS — E785 Hyperlipidemia, unspecified: Secondary | ICD-10-CM

## 2019-02-24 MED ORDER — DAPAGLIFLOZIN PROPANEDIOL 10 MG PO TABS: 10.0000 mg | ORAL_TABLET | Freq: Every day | ORAL | 6 refills | Status: DC

## 2019-02-24 MED ORDER — METFORMIN HCL ER 500 MG PO TB24: ORAL_TABLET | ORAL | 1 refills | Status: DC

## 2019-02-24 NOTE — Patient Instructions (Addendum)
RANGE OF A1C   Your A1C is a measure of your sugar over the past 3 months and is not affected by what you have eaten over the past few days. Diabetes increases your chances of stroke and heart attack over 300 % and is the leading cause of blindness and kidney failure in the [place]. Please make sure you decrease bad carbs like white bread, white rice, potatoes, corn, soft drinks, pasta, cereals, refined sugars, sweet tea, dried fruits, and fruit juice. Good carbs are okay to eat in moderation like sweet potatoes, brown rice, whole grain pasta/bread, most fruit (except dried fruit) and you can eat as many veggies as you want.   Greater than 6.5 is considered diabetic. Between 6.4 and 5.7 is prediabetic If your A1C is less than 5.7 you are NOT diabetic.  TEST 2 HOURS AFTER YOU EAT AND IN THE MORNING  Targets for Glucose Readings: Time of Check Target for patients WITHOUT Diabetes Target for DIABETICS  Before Meals Less than 100  less than 150  Two hours after meals Less than 200  Less than 250   Take the metformin 1 pill WITH food Try to take 2 pills at night WITH supper.  We are starting you on Metformin to prevent or treat diabetes.  Metformin does NOT cause low blood sugars.   In order to create energy your cells need insulin and sugar but sometime your cells do not accept the insulin and this can cause increased sugars and decreased energy.   The Metformin helps your cells accept insulin and the sugar this help: 1) increase your energy  2) weight loss.    The two most common side effects are nausea and diarrhea, follow these rules to avoid it but these symptoms get better with time on the medication.    ALSO You can take imodium per box instructions when starting metformin if needed.   Rules of metformin: 1) start out slow with only one pill daily. Our goal for you is 4 pills a day or 2000mg  total.  2) take with your largest meal. 3) Take with least amount of carbs.   Call  if you have any problems.   Can do a steroid nasal spary 1-2 sparys at night each nostril.  Remember to spray each nostril twice towards the outer part of your eye.   Do not sniff but instead pinch your nose and tilt your head back to help the medicine get into your sinuses.   The best time to do this is at bedtime.  Stop if you get blurred vision or nose bleeds.   THIS WILL TAKE 7 DAYS TO WORK AND IS BETTER IF YOU START BEFORE SYMPTOMS SO IF YOU HAVE A SEASON OR TIME OF THE YEAR YOU ALWAYS GET A COLD, START BEFORE THAT!  STARTING A DIABETIC MEDICATION  The medication we are going to start you on is a sodium-glucose cotransporter-2 (SGLT2) inhibitors for your diabetes and weight.   This can decrease your blood pressure so please contact us if you have any dizziness, sometime we need to decrease or stop fluid pills or decrease BP meds.  You are peeing out 300-400 calories a day of sugar which can lead to yeast infections, you can take the diflucan as needed but if the infections continue we will stop the medications.  If you get any pain or discomfort around your buttocks or genitals please let us know if it does not go away. We will have you  follow up in 1 month to check your kidney function and weight. Call if you need anything.      When it comes to diets, agreement about the perfect plan isn't easy to find, even among the experts. Experts at the [employer] developed an idea known as the Healthy Eating Plate. Just imagine a plate divided into logical, healthy portions.  The emphasis is on diet quality:  Load up on vegetables and fruits - one-half of your plate: Aim for color and variety, and remember that potatoes don't count.  Go for whole grains - one-quarter of your plate: Whole wheat, barley, wheat berries, quinoa, oats, brown rice, and foods made with them. If you want pasta, go with whole wheat pasta.  Protein power - one-quarter of your plate: Fish,  chicken, beans, and nuts are all healthy, versatile protein sources. Limit red meat.  The diet, however, does go beyond the plate, offering a few other suggestions.  Use healthy plant oils, such as olive, canola, soy, corn, sunflower and peanut. Check the labels, and avoid partially hydrogenated oil, which have unhealthy trans fats.  If you're thirsty, drink water. Coffee and tea are good in moderation, but skip sugary drinks and limit milk and dairy products to one or two daily servings.  The type of carbohydrate in the diet is more important than the amount. Some sources of carbohydrates, such as vegetables, fruits, whole grains, and beans-are healthier than others.  Finally, stay active.   COLD INFORMATION  Try just the allergy pill and prednisone for a few days, you always want to give your body 10 days to fight off infection with help before taking an anabiotic.   BEWARE ANTIBIOTICS Antibiotics have been linked with colon infections, resistance and newest theory is colon cancer in 40-50 year olds. So it is VERY important to try to avoid them, antibiotics are NOT risk free medications.   If you are not feeling better make an office visit OR CONTACT us.   Here is more info below HOW TO TREAT VIRAL COUGH AND COLD SYMPTOMS:  -Symptoms usually last at least 1 week with the worst symptoms being around day 4.  - colds usually start with a sore throat and end with a cough, and the cough can take 2 weeks to get better.  -No antibiotics are needed for colds, flu, sore throats, cough, bronchitis UNLESS symptoms are longer than 7 days OR if you are getting better then get drastically worse.  -There are a lot of combination medications (Dayquil, Nyquil, Vicks 44, tyelnol cold and sinus, ETC). Please look at the ingredients on the back so that you are treating the correct symptoms and not doubling up on medications/ingredients.    Medicines you can use  Nasal congestion  Little Remedies  saline spray (aerosol/mist)- can try this, it is in the kids section - pseudoephedrine (Sudafed)- behind the counter, do not use if you have high blood pressure, medicine that have -D in them.  - phenylephrine (Sudafed PE) -Dextormethorphan + chlorpheniramine (Coridcidin HBP)- okay if you have high blood pressure -Oxymetazoline (Afrin) nasal spray- LIMIT to 3 days -Saline nasal spray -Neti pot (used distilled or bottled water)  Ear pain/congestion  -pseudoephedrine (sudafed) - Nasonex/flonase nasal spray  Fever  -Acetaminophen (Tyelnol) -Ibuprofen (Advil, motrin, aleve)  Sore Throat  -Acetaminophen (Tyelnol) -Ibuprofen (Advil, motrin, aleve) -Drink a lot of water -Gargle with salt water - Rest your voice (don't talk) -Throat sprays -Cough drops  Body Aches  -  Acetaminophen (Tyelnol) -Ibuprofen (Advil, motrin, aleve)  Headache  -Acetaminophen (Tyelnol) -Ibuprofen (Advil, motrin, aleve) - Exedrin, Exedrin Migraine  Allergy symptoms (cough, sneeze, runny nose, itchy eyes) -Claritin or loratadine cheapest but likely the weakest  -Zyrtec or certizine at night because it can make you sleepy -The strongest is allegra or fexafinadine  Cheapest at [employer], [employer], [employer]  Cough  -Dextromethorphan (Delsym)- medicine that has DM in it -Guafenesin (Mucinex/Robitussin) - cough drops - drink lots of water  Chest Congestion  -Guafenesin (Mucinex/Robitussin)  Red Itchy Eyes  - Naphcon-A  Upset Stomach  - Bland diet (nothing spicy, greasy, fried, and high acid foods like tomatoes, oranges, berries) -OKAY- cereal, bread, soup, crackers, rice -Eat smaller more frequent meals -reduce caffeine, no alcohol -Loperamide (Imodium-AD) if diarrhea -Prevacid for heart burn  General health when sick  -Hydration -wash your hands frequently -keep surfaces clean -change pillow cases and sheets often -Get fresh air but do not exercise strenuously -Vitamin D, double up on it - Vitamin  C -Zinc

## 2019-04-01 ENCOUNTER — Telehealth: Payer: Managed Care, Other (non HMO) | Admitting: Physician Assistant

## 2019-04-01 DIAGNOSIS — B9689 Other specified bacterial agents as the cause of diseases classified elsewhere: Secondary | ICD-10-CM | POA: Diagnosis not present

## 2019-04-01 DIAGNOSIS — J019 Acute sinusitis, unspecified: Secondary | ICD-10-CM | POA: Diagnosis not present

## 2019-04-01 MED ORDER — DOXYCYCLINE HYCLATE 100 MG PO CAPS: 100.0000 mg | ORAL_CAPSULE | Freq: Two times a day (BID) | ORAL | 0 refills | Status: AC

## 2019-04-01 NOTE — Progress Notes (Signed)
We are sorry that you are not feeling well.  Here is how we plan to help!  Based on what you have shared with me it looks like you have sinusitis.  Sinusitis is inflammation and infection in the sinus cavities of the head.  Based on your presentation I believe you most likely have Acute Bacterial Sinusitis.  This is an infection caused by bacteria and is treated with antibiotics. I have prescribed Doxycycline 100mg  by mouth twice a day for 10 days. You may use an oral decongestant such as Mucinex D or if you have glaucoma or high blood pressure use plain Mucinex. Saline nasal spray help and can safely be used as often as needed for congestion.  If you develop worsening sinus pain, fever or notice severe headache and vision changes, or if symptoms are not better after completion of antibiotic, please schedule an appointment with a health care provider.    Sinus infections are not as easily transmitted as other respiratory infection, however we still recommend that you avoid close contact with loved ones, especially the very young and elderly.  Remember to wash your hands thoroughly throughout the day as this is the number one way to prevent the spread of infection!  Home Care: Only take medications as instructed by your medical team. Complete the entire course of an antibiotic. Do not take these medications with alcohol. A steam or ultrasonic humidifier can help congestion.  You can place a towel over your head and breathe in the steam from hot water coming from a faucet. Avoid close contacts especially the very young and the elderly. Cover your mouth when you cough or sneeze. Always remember to wash your hands.  Get Help Right Away If: You develop worsening fever or sinus pain. You develop a severe head ache or visual changes. Your symptoms persist after you have completed your treatment plan.  Make sure you Understand these instructions. Will watch your condition. Will get help right away if  you are not doing well or get worse.  Your e-visit answers were reviewed by a board certified advanced clinical practitioner to complete your personal care plan.  Depending on the condition, your plan could have included both over the counter or prescription medications.  If there is a problem please reply once you have received a response from your provider.  Your safety is important to [place].  If you have drug allergies check your prescription carefully.    You can use MyChart to ask questions about today's visit, request a non-urgent call back, or ask for a work or school excuse for 24 hours related to this e-Visit. If it has been greater than 24 hours you will need to follow up with your provider, or enter a new e-Visit to address those concerns.  You will get an e-mail in the next two days asking about your experience.  I hope that your e-visit has been valuable and will speed your recovery. Thank you for using e-visits.    ===View-only below this line===   ----- Message -----    From: Andrea Bush    Sent: 04/01/2019  8:20 AM EDT      To: E-Visit Mailing List Subject: E-Visit Submission: Sinus Problems  E-Visit Submission: Sinus Problems --------------------------------  Question: Which of the following have you been experiencing? Answer:   Congested nose            Pain around the nose and face  Ear pain  Question: Have these symptoms significantly worsened over the last two to three days? Answer:   Yes  Question: Have you had any of the following? Answer:   None of the above  Question: How long have you been having these symptoms? Answer:   7 or more days  Question: Do you have a fever? Answer:   No, I do not have a fever  Question: Do you smoke? Answer:   No  Question: Have you ever smoked? Answer:   I have never smoked  Question: Do you have any chronic illnesses, such as diabetes, heart disease, kidney disease, or lung disease, or any illness  that would weaken your body's ability to fight infection? Answer:   Yes  Question: Please enter details of your other illness. Answer:   I have been taking Alka Seltzer and recently changed to Sudafed .  It is making me nauseous and I know from experience I have a sinus infection over the counter meds are not going to cure.  I also take Zyrtec and Flonase.    I need an antibiotic to help please.  Question: When you blow your nose, what color is the mucus? Answer:   Mostly thick and yellow or green  Question: Have you experienced similar problems in the past? Answer:   Yes  Question: What treatments have worked in the past?  Answer:   Whatever antibiotic I had last time worked but I don't remember what it was.  Question: What treatment(s) in the past have been unsuccessful? Answer:   Allergic to penicillin  Question: Is this illness similar to previous illnesses you have had?  How is it the same?  How is it different? Answer:   Yes sinus infection - my nose is stuffy but nothing is really coming out.   I started sneezing this morning but nothing came up or out.  Question: Have you recently been hospitalized? Answer:   No  Question: What medications are you currently taking for these symptoms? Answer:   Decongestants            Nose spray  Question: Please enter the names of any medications you are taking, or any other treatments you are trying. Answer:   Sudafed             Flonase            Zyrtec daily  Question: Please list your medication allergies that you may have ? (If 'none' , please list as 'none') Answer:   Penicillin  Question: Please list any additional comments  Answer:     A total of 5-10 minutes was spent evaluating this patients questionnaire and formulating a plan of care.

## 2019-04-03 ENCOUNTER — Ambulatory Visit: Payer: Self-pay | Admitting: Physician Assistant

## 2019-04-14 NOTE — Progress Notes (Signed)
FOLLOW UP  Assessment and Plan:   Essential hypertension -     CBC with Differential/Platelet -     COMPLETE METABOLIC PANEL WITH GFR -     TSH - continue medications, DASH diet, exercise and monitor at home. Call if greater than 130/80.   Type 2 diabetes mellitus with hyperlipidemia (HCC) -     Hemoglobin A1c -     empagliflozin (JARDIANCE) 25 MG TABS tablet; Take 25 mg by mouth daily. - stop farxiga, try jardiance- may need to add on another medication - patient is very resistant to meds - stressed diet/increasing exercise.   Hyperlipidemia, unspecified hyperlipidemia type -     Lipid panel check lipids decrease fatty foods increase activity.   Morbid obesity ([place]) - follow up 3 months for progress monitoring - increase veggies, decrease carbs - long discussion about weight loss, diet, and exercise  Gastroesophageal reflux disease with esophagitis -     famotidine (PEPCID) 40 MG tablet; Take 1 tablet (40 mg total) by mouth every evening. H2 blocker, diet discussed  Nasopharyngitis ? From farxiga, no fever, chills, no cough, no SOB.  Continue flonase, check CBC, switch farxiga to [employer] and meds as discussed. Further disposition pending results of labs. Discussed med's effects and SE's.   Over 30 minutes of exam, counseling, chart review, and critical decision making was performed.   Future Appointments  Date Time Provider [place]  01/05/2020 10:00 AM Vicie Mutters, PA-C [facility] None    ----------------------------------------------------------------------------------------------------------------------  HPI 53 y.o. female  presents for 3 month follow up on hypertension, cholesterol, diabetes, morbid obesity and vitamin D deficiency.   Senior in the house, and that has been very depressing but she is starting [employer] 09/07.   She has not been feeling good, having some sinus issues, took ABX, feeling better today but  still has nasal issues. No fevers, she has been on aleve, she is on flonase, took doxy and zyrtec. She does have chest heaviness and feels that she can not get a deep breath. She has had burping, feeling GERD/pressure since taking doxy.   BMI is Body mass index is 39.87 kg/m., she is working on diet and exercise; she reports she has increased her water intake, walking daily, increasing vegetables and baking rather than frying. She stopped phentermine as doesn't want to use medications for weight loss.  Wt Readings from Last 3 Encounters:  04/15/19 270 lb (122.5 kg)  02/24/19 273 lb (123.8 kg)  12/25/18 270 lb 12.8 oz (122.8 kg)   She has not been checkiing BP at home, today their BP is BP: 130/72  She does workout. She denies chest pain, shortness of breath, dizziness.   She is not on cholesterol medication and denies myalgias. Her cholesterol is not at goal. The cholesterol last visit was:   Lab Results  Component Value Date   CHOL 213 (H) 12/25/2018   HDL 49 (L) 12/25/2018   LDLCALC 124 (H) 12/25/2018   TRIG 263 (H) 12/25/2018   CHOLHDL 4.3 12/25/2018    She has been working on diet and exercise for T2 diabetes, she is very resistant to starting on medications, she is on farxiga and metformin.  She denies foot ulcerations, increased appetite, nausea, paresthesia of the feet, polydipsia, polyuria, visual disturbances, vomiting and weight loss. She has a meter at home but has not been checking. Last A1C in the office was:  Lab Results  Component Value Date   HGBA1C  10.5 (H) 12/25/2018   Patient is not on Vitamin D supplement.   Lab Results  Component Value Date   VD25OH 19 (L) 12/25/2018       Current Medications:  Current Outpatient Medications on File Prior to Visit  Medication Sig  . cetirizine (ZYRTEC) 10 MG tablet Take 10 mg by mouth daily.  . Cholecalciferol 5000 units capsule Take 5,000 Units by mouth daily.  Marland Kitchen lisinopril (PRINIVIL,ZESTRIL) 20 MG tablet TAKE 1 TABLET BY  MOUTH EVERY DAY  . metFORMIN (GLUCOPHAGE-XR) 500 MG 24 hr tablet Start by taking 2 tabs with dinner.   No current facility-administered medications on file prior to visit.      Allergies:  Allergies  Allergen Reactions  . Crestor [Rosuvastatin]     Hair Loss   . Penicillins Rash     Medical History:  Past Medical History:  Diagnosis Date  . Fibrocystic breast   . Hyperlipidemia   . Hypertension   . Hypothyroidism   . Iron deficiency anemia   . Obesity    BMI 41  . Prediabetes   . Pulmonary embolism ([place]) 2006   from BCP  . Vitamin D deficiency    Family history- Reviewed and unchanged Social history- Reviewed and unchanged   Review of Systems:  Review of Systems  Constitutional: Negative for chills, fever, malaise/fatigue and weight loss.  HENT: Positive for congestion and sinus pain. Negative for ear discharge, hearing loss, nosebleeds, sore throat and tinnitus.   Eyes: Negative for blurred vision and double vision.  Respiratory: Negative for cough, hemoptysis, sputum production, shortness of breath, wheezing and stridor.   Cardiovascular: Negative for chest pain, palpitations, orthopnea, claudication, leg swelling and PND.  Gastrointestinal: Negative for abdominal pain, blood in stool, constipation, diarrhea, heartburn, melena, nausea and vomiting.  Genitourinary: Negative.   Musculoskeletal: Negative for joint pain and myalgias.  Skin: Negative for rash.  Neurological: Negative for dizziness, tingling, sensory change, weakness and headaches.  Endo/Heme/Allergies: Negative for polydipsia.  Psychiatric/Behavioral: Negative.   All other systems reviewed and are negative.   Physical Exam: BP 130/72   Pulse (!) 101   Temp (!) 97.2 F (36.2 C)   Ht 5\' 9"  (1.753 m)   Wt 270 lb (122.5 kg)   SpO2 98%   BMI 39.87 kg/m  Wt Readings from Last 3 Encounters:  04/15/19 270 lb (122.5 kg)  02/24/19 273 lb (123.8 kg)  12/25/18 270 lb 12.8 oz (122.8 kg)   General  Appearance: Well nourished, in no apparent distress. Eyes: PERRLA, EOMs, conjunctiva no swelling or erythema Sinuses: No Frontal/maxillary tenderness ENT/Mouth: Ext aud canals clear, TMs without erythema, bulging. No erythema, swelling, or exudate on post pharynx.  Tonsils not swollen or erythematous. Hearing normal.  Neck: Supple, thyroid normal.  Respiratory: Respiratory effort normal, BS equal bilaterally without rales, rhonchi, wheezing or stridor.  Cardio: RRR with no MRGs. Brisk peripheral pulses without edema.  Abdomen: Soft, + BS.  Non tender, no guarding, rebound, hernias, masses. Lymphatics: Non tender without lymphadenopathy.  Musculoskeletal: Full ROM, 5/5 strength, Normal gait Skin: Warm, dry without rashes, lesions, ecchymosis.  Neuro: Cranial nerves intact. No cerebellar symptoms.  Psych: Awake and oriented X 3, normal affect, Insight and Judgment appropriate.    Vicie Mutters, PA-C 12:03 PM [facility]

## 2019-04-15 ENCOUNTER — Encounter: Payer: Self-pay | Admitting: Physician Assistant

## 2019-04-15 ENCOUNTER — Other Ambulatory Visit: Payer: Self-pay

## 2019-04-15 ENCOUNTER — Ambulatory Visit (INDEPENDENT_AMBULATORY_CARE_PROVIDER_SITE_OTHER): Payer: 59 | Admitting: Physician Assistant

## 2019-04-15 VITALS — BP 130/72 | HR 101 | Temp 97.2°F | Ht 69.0 in | Wt 270.0 lb

## 2019-04-15 DIAGNOSIS — Z86711 Personal history of pulmonary embolism: Secondary | ICD-10-CM

## 2019-04-15 DIAGNOSIS — I10 Essential (primary) hypertension: Secondary | ICD-10-CM

## 2019-04-15 DIAGNOSIS — K2100 Gastro-esophageal reflux disease with esophagitis, without bleeding: Secondary | ICD-10-CM

## 2019-04-15 DIAGNOSIS — E1169 Type 2 diabetes mellitus with other specified complication: Secondary | ICD-10-CM

## 2019-04-15 DIAGNOSIS — J00 Acute nasopharyngitis [common cold]: Secondary | ICD-10-CM

## 2019-04-15 DIAGNOSIS — E785 Hyperlipidemia, unspecified: Secondary | ICD-10-CM | POA: Diagnosis not present

## 2019-04-15 MED ORDER — FAMOTIDINE 40 MG PO TABS: 40.0000 mg | ORAL_TABLET | Freq: Every evening | ORAL | 1 refills | Status: DC

## 2019-04-15 MED ORDER — EMPAGLIFLOZIN 25 MG PO TABS: 25.0000 mg | ORAL_TABLET | Freq: Every day | ORAL | 0 refills | Status: DC

## 2019-04-15 NOTE — Patient Instructions (Addendum)
Please check your sugars  Please get out and walk and increase exercise.   Will stop farxiga and switch jardiance due to possible nasopharangititis   Check out [name] and [employer] 7 min workout  Check out  Mini habits for weight loss book  2 apps for tracking food is myfitness pal  loseit OR can take picture of your food   Take pepcid 20 or 40mg  at night for 2 weeks, then you can stop or continue as needed.  Avoid alcohol, spicy foods, NSAIDS (aleve, ibuprofen) at this time. See foods below.   Food Choices for Gastroesophageal Reflux Disease When you have gastroesophageal reflux disease (GERD), the foods you eat and your eating habits are very important. Choosing the right foods can help ease the discomfort of GERD. WHAT GENERAL GUIDELINES DO I NEED TO FOLLOW?  Choose fruits, vegetables, whole grains, low-fat dairy products, and low-fat meat, fish, and poultry.  Limit fats such as oils, salad dressings, butter, nuts, and avocado.  Keep a food diary to identify foods that cause symptoms.  Avoid foods that cause reflux. These may be different for different people.  Eat frequent small meals instead of three large meals each day.  Eat your meals slowly, in a relaxed setting.  Limit fried foods.  Cook foods using methods other than frying.  Avoid drinking alcohol.  Avoid drinking large amounts of liquids with your meals.  Avoid bending over or lying down until 2-3 hours after eating. WHAT FOODS ARE NOT RECOMMENDED? The following are some foods and drinks that may worsen your symptoms: Vegetables Tomatoes. Tomato juice. Tomato and spaghetti sauce. Chili peppers. Onion and garlic. Horseradish. Fruits Oranges, grapefruit, and lemon (fruit and juice). Meats High-fat meats, fish, and poultry. This includes hot dogs, ribs, ham, sausage, salami, and bacon. Dairy Whole milk and chocolate milk. Sour cream. Cream. Butter. Ice cream. Cream cheese.  Beverages Coffee and tea, with  or without caffeine. Carbonated beverages or energy drinks. Condiments Hot sauce. Barbecue sauce.  Sweets/Desserts Chocolate and cocoa. Donuts. Peppermint and spearmint. Fats and Oils High-fat foods, including [place] fries and potato chips. Other Vinegar. Strong spices, such as black pepper, white pepper, red pepper, cayenne, curry powder, cloves, ginger, and chili powder.   VITAMIN D IS IMPORTANT  Vitamin D goal is between 60-80  Please make sure that you are taking your Vitamin D as directed.   It is very important as a natural anti-inflammatory   helping hair, skin, and nails, as well as reducing stroke and heart attack risk.   It helps your bones and helps with mood.  We want you on at least 5000 IU daily  It also decreases numerous cancer risks so please take it as directed.   Low Vit D is associated with a 200-300% higher risk for CANCER   and 200-300% higher risk for HEART   ATTACK  &  STROKE.    .....................................Marland Kitchen  It is also associated with higher death rate at younger ages,   autoimmune diseases like Rheumatoid arthritis, Lupus, Multiple Sclerosis.     Also many other serious conditions, like depression, Alzheimer's  Dementia, infertility, muscle aches, fatigue, fibromyalgia - just to name a few.  +++++++++++++++++++  Can get liquid vitamin D from [facility] here in [place] at  [facility] alternatives [address], [place], [place] [address] Or you can try earth fare

## 2019-04-16 LAB — LIPID PANEL
Cholesterol: 243 mg/dL — ABNORMAL HIGH (ref <200)
HDL: 55 mg/dL (ref >=50)
LDL Cholesterol (Calc): 144 mg/dL (calc) — ABNORMAL HIGH
Non-HDL Cholesterol (Calc): 188 mg/dL (calc) — ABNORMAL HIGH (ref <130)
Total CHOL/HDL Ratio: 4.4 (calc) (ref <5.0)
Triglycerides: 289 mg/dL — ABNORMAL HIGH (ref <150)

## 2019-04-16 LAB — COMPLETE METABOLIC PANEL WITH GFR
AG Ratio: 1.3 (calc) (ref 1.0–2.5)
ALT: 49 U/L — ABNORMAL HIGH (ref 6–29)
AST: 45 U/L — ABNORMAL HIGH (ref 10–35)
Albumin: 4.3 g/dL (ref 3.6–5.1)
Alkaline phosphatase (APISO): 46 U/L (ref 37–153)
BUN: 14 mg/dL (ref 7–25)
CO2: 30 mmol/L (ref 20–32)
Calcium: 10.1 mg/dL (ref 8.6–10.4)
Chloride: 101 mmol/L (ref 98–110)
Creat: 0.91 mg/dL (ref 0.50–1.05)
GFR, Est African American: 82 mL/min/{1.73_m2} (ref >=60)
GFR, Est Non African American: 71 mL/min/{1.73_m2} (ref >=60)
Globulin: 3.3 g/dL (calc) (ref 1.9–3.7)
Glucose, Bld: 216 mg/dL — ABNORMAL HIGH (ref 65–99)
Potassium: 4.3 mmol/L (ref 3.5–5.3)
Sodium: 140 mmol/L (ref 135–146)
Total Bilirubin: 0.4 mg/dL (ref 0.2–1.2)
Total Protein: 7.6 g/dL (ref 6.1–8.1)

## 2019-04-16 LAB — CBC WITH DIFFERENTIAL/PLATELET
Absolute Monocytes: 442 cells/uL (ref 200–950)
Basophils Absolute: 47 cells/uL (ref 0–200)
Basophils Relative: 0.6 %
Eosinophils Absolute: 213 cells/uL (ref 15–500)
Eosinophils Relative: 2.7 %
HCT: 46.8 % — ABNORMAL HIGH (ref 35.0–45.0)
Hemoglobin: 15.8 g/dL — ABNORMAL HIGH (ref 11.7–15.5)
Lymphs Abs: 3342 cells/uL (ref 850–3900)
MCH: 29.5 pg (ref 27.0–33.0)
MCHC: 33.8 g/dL (ref 32.0–36.0)
MCV: 87.3 fL (ref 80.0–100.0)
MPV: 8.9 fL (ref 7.5–12.5)
Monocytes Relative: 5.6 %
Neutro Abs: 3855 cells/uL (ref 1500–7800)
Neutrophils Relative %: 48.8 %
Platelets: 266 10*3/uL (ref 140–400)
RBC: 5.36 10*6/uL — ABNORMAL HIGH (ref 3.80–5.10)
RDW: 12.8 % (ref 11.0–15.0)
Total Lymphocyte: 42.3 %
WBC: 7.9 10*3/uL (ref 3.8–10.8)

## 2019-04-16 LAB — TSH: TSH: 2.09 mIU/L

## 2019-04-16 LAB — HEMOGLOBIN A1C
Hgb A1c MFr Bld: 10 % of total Hgb — ABNORMAL HIGH (ref <5.7)
Mean Plasma Glucose: 240 (calc)
eAG (mmol/L): 13.3 (calc)

## 2019-05-26 ENCOUNTER — Ambulatory Visit: Payer: 59 | Admitting: Physician Assistant

## 2019-06-02 ENCOUNTER — Other Ambulatory Visit: Payer: Self-pay

## 2019-06-02 DIAGNOSIS — E785 Hyperlipidemia, unspecified: Secondary | ICD-10-CM

## 2019-06-02 DIAGNOSIS — E1169 Type 2 diabetes mellitus with other specified complication: Secondary | ICD-10-CM

## 2019-06-02 MED ORDER — JARDIANCE 25 MG PO TABS: ORAL_TABLET | ORAL | 0 refills | Status: DC

## 2019-06-09 NOTE — Progress Notes (Deleted)
Diabetes Education and Follow-Up Visit  53 y.o.female presents for diabetic education. Patient is very resistant to start new medications. She was switched from [employer]s to jardiance last visit due to side effects.   She has Diabetes Mellitus type 2 With hyperlipemia resistant to start statin CKD stage 1, on ACE  Last hemoglobin A1c was: Lab Results  Component Value Date   HGBA1C 10.0 (H) 04/15/2019   HGBA1C 10.5 (H) 12/25/2018   HGBA1C 10.2 (H) 06/18/2018   Lab Results  Component Value Date   GFRAA 82 04/15/2019   Lab Results  Component Value Date   CHOL 243 (H) 04/15/2019   HDL 55 04/15/2019   LDLCALC 144 (H) 04/15/2019   TRIG 289 (H) 04/15/2019   CHOLHDL 4.4 04/15/2019    There is no height or weight on file to calculate BMI.  Pt is on a regimen of: Metformin and jardiance  Pt checks her sugars {1-4:31454} x day  Lowest sugar was ***.  She has hypoglycemia awareness.  Highest sugar was ***.  Glucometer:   Exercise:    Lab Results  Component Value Date   MICROALBUR 0.7 12/25/2018     Problem List has Hyperlipidemia; Vitamin D deficiency; Hypertension; Type 2 diabetes mellitus with hyperlipidemia ([place]); Morbid obesity ([place]); Anemia, iron deficiency; Fibrocystic breast; and History of pulmonary embolus (PE) on their problem list.  Medications Current Outpatient Medications on File Prior to Visit  Medication Sig  . cetirizine (ZYRTEC) 10 MG tablet Take 10 mg by mouth daily.  . Cholecalciferol 5000 units capsule Take 5,000 Units by mouth daily.  . empagliflozin (JARDIANCE) 25 MG TABS tablet Take 1 tablet Daily for Diabetes  . famotidine (PEPCID) 40 MG tablet Take 1 tablet (40 mg total) by mouth every evening.  Marland Kitchen lisinopril (PRINIVIL,ZESTRIL) 20 MG tablet TAKE 1 TABLET BY MOUTH EVERY DAY  . metFORMIN (GLUCOPHAGE-XR) 500 MG 24 hr tablet Start by taking 2 tabs with dinner.   No current facility-administered medications on file prior to visit.     ROS- see  HPI  Physical Exam: There were no vitals taken for this visit. There is no height or weight on file to calculate BMI. General Appearance: Well nourished, in no apparent distress. Eyes: PERRLA, EOMs, conjunctiva no swelling or erythema ENT/Mouth: Ext aud canals clear, TMs without erythema, bulging. No erythema, swelling, or exudate on post pharynx.  Tonsils not swollen or erythematous. Hearing normal.  Respiratory: Respiratory effort normal, BS equal bilaterally without rales, rhonchi, wheezing or stridor.  Cardio: RRR with no MRGs. Brisk peripheral pulses without edema.  Abdomen: Soft, + BS.  Non tender, no guarding, rebound, hernias, masses. Musculoskeletal: Full ROM, 5/5 strength, normal gait.  Skin: Warm, dry without rashes, lesions, ecchymosis.  Neuro: Cranial nerves intact. Normal muscle tone, no cerebellar symptoms. Sensation intact.    Plan and Assessment: Diabetes Education: Reviewed 'ABCs' of diabetes management (respective goals in parentheses):  A1C (<7), blood pressure (<130/80), and cholesterol (LDL <70) Eye Exam yearly and Dental Exam every 6 months. Dietary recommendations Physical Activity recommendations - Strongly advised her to start checking sugars at different times of the day - check 2 times a day, rotating checks - given sugar log and advised how to fill it and to bring it at next appt  - given foot care handout and explained the principles  - given instructions for hypoglycemia management    Future Appointments  Date Time Provider [place]  06/11/2019 11:00 AM Vicie Mutters, PA-C [facility] None  07/21/2019  9:30 AM Vicie Mutters, PA-C [facility] None  01/05/2020 10:00 AM Vicie Mutters, PA-C [facility] None

## 2019-06-11 ENCOUNTER — Ambulatory Visit: Payer: 59 | Admitting: Physician Assistant

## 2019-06-11 ENCOUNTER — Telehealth: Payer: Self-pay | Admitting: Physician Assistant

## 2019-06-11 DIAGNOSIS — E1169 Type 2 diabetes mellitus with other specified complication: Secondary | ICD-10-CM

## 2019-06-11 MED ORDER — JARDIANCE 25 MG PO TABS: ORAL_TABLET | ORAL | 3 refills | Status: DC

## 2019-06-11 NOTE — Telephone Encounter (Signed)
-----   Message from Elenor Quinones, [place] sent at 06/11/2019 11:55 AM EDT ----- Regarding: med request Contact: [phone] Patient an RX for JARDIANCE- (was given samples)    [facility]

## 2019-06-25 ENCOUNTER — Other Ambulatory Visit: Payer: Self-pay | Admitting: Adult Health

## 2019-06-30 ENCOUNTER — Emergency Department (HOSPITAL_COMMUNITY): Payer: No Typology Code available for payment source

## 2019-06-30 ENCOUNTER — Encounter (HOSPITAL_COMMUNITY): Payer: Self-pay | Admitting: Emergency Medicine

## 2019-06-30 ENCOUNTER — Other Ambulatory Visit: Payer: Self-pay

## 2019-06-30 ENCOUNTER — Inpatient Hospital Stay (HOSPITAL_COMMUNITY)
Admission: EM | Admit: 2019-06-30 | Discharge: 2019-07-02 | DRG: 176 | Disposition: A | Discharge status: Home / Self Care | Payer: No Typology Code available for payment source | Attending: Family Medicine | Admitting: Family Medicine

## 2019-06-30 ENCOUNTER — Telehealth: Payer: Self-pay | Admitting: Physician Assistant

## 2019-06-30 DIAGNOSIS — I10 Essential (primary) hypertension: Secondary | ICD-10-CM | POA: Diagnosis present

## 2019-06-30 DIAGNOSIS — Z888 Allergy status to other drugs, medicaments and biological substances status: Secondary | ICD-10-CM

## 2019-06-30 DIAGNOSIS — R079 Chest pain, unspecified: Secondary | ICD-10-CM | POA: Diagnosis not present

## 2019-06-30 DIAGNOSIS — R7989 Other specified abnormal findings of blood chemistry: Secondary | ICD-10-CM | POA: Diagnosis not present

## 2019-06-30 DIAGNOSIS — Z6838 Body mass index (BMI) 38.0-38.9, adult: Secondary | ICD-10-CM

## 2019-06-30 DIAGNOSIS — E669 Obesity, unspecified: Secondary | ICD-10-CM | POA: Diagnosis present

## 2019-06-30 DIAGNOSIS — Z833 Family history of diabetes mellitus: Secondary | ICD-10-CM

## 2019-06-30 DIAGNOSIS — E785 Hyperlipidemia, unspecified: Secondary | ICD-10-CM | POA: Diagnosis present

## 2019-06-30 DIAGNOSIS — Z79899 Other long term (current) drug therapy: Secondary | ICD-10-CM

## 2019-06-30 DIAGNOSIS — I2602 Saddle embolus of pulmonary artery with acute cor pulmonale: Secondary | ICD-10-CM

## 2019-06-30 DIAGNOSIS — Z7984 Long term (current) use of oral hypoglycemic drugs: Secondary | ICD-10-CM

## 2019-06-30 DIAGNOSIS — Z20828 Contact with and (suspected) exposure to other viral communicable diseases: Secondary | ICD-10-CM | POA: Diagnosis present

## 2019-06-30 DIAGNOSIS — E1165 Type 2 diabetes mellitus with hyperglycemia: Secondary | ICD-10-CM | POA: Diagnosis present

## 2019-06-30 DIAGNOSIS — E039 Hypothyroidism, unspecified: Secondary | ICD-10-CM | POA: Diagnosis present

## 2019-06-30 DIAGNOSIS — K219 Gastro-esophageal reflux disease without esophagitis: Secondary | ICD-10-CM | POA: Diagnosis present

## 2019-06-30 DIAGNOSIS — R778 Other specified abnormalities of plasma proteins: Secondary | ICD-10-CM | POA: Diagnosis present

## 2019-06-30 DIAGNOSIS — Z8262 Family history of osteoporosis: Secondary | ICD-10-CM

## 2019-06-30 DIAGNOSIS — I2699 Other pulmonary embolism without acute cor pulmonale: Principal | ICD-10-CM | POA: Diagnosis present

## 2019-06-30 DIAGNOSIS — E1169 Type 2 diabetes mellitus with other specified complication: Secondary | ICD-10-CM | POA: Diagnosis present

## 2019-06-30 DIAGNOSIS — Z7982 Long term (current) use of aspirin: Secondary | ICD-10-CM

## 2019-06-30 DIAGNOSIS — Z880 Allergy status to penicillin: Secondary | ICD-10-CM

## 2019-06-30 DIAGNOSIS — Z86711 Personal history of pulmonary embolism: Secondary | ICD-10-CM

## 2019-06-30 LAB — BASIC METABOLIC PANEL
Anion gap: 10 (ref 5–15)
BUN: 12 mg/dL (ref 6–20)
CO2: 22 mmol/L (ref 22–32)
Calcium: 9.2 mg/dL (ref 8.9–10.3)
Chloride: 104 mmol/L (ref 98–111)
Creatinine, Ser: 0.86 mg/dL (ref 0.44–1.00)
GFR calc Af Amer: >60 mL/min (ref >60)
GFR calc non Af Amer: >60 mL/min (ref >60)
Glucose, Bld: 262 mg/dL — ABNORMAL HIGH (ref 70–99)
Potassium: 4.4 mmol/L (ref 3.5–5.1)
Sodium: 136 mmol/L (ref 135–145)

## 2019-06-30 LAB — I-STAT BETA HCG BLOOD, ED (MC, WL, AP ONLY): I-stat hCG, quantitative: <5 m[IU]/mL (ref <5)

## 2019-06-30 LAB — TROPONIN I (HIGH SENSITIVITY)
Troponin I (High Sensitivity): 270 ng/L (ref <18)
Troponin I (High Sensitivity): 384 ng/L (ref <18)

## 2019-06-30 LAB — CBC
HCT: 49.2 % — ABNORMAL HIGH (ref 36.0–46.0)
Hemoglobin: 16.4 g/dL — ABNORMAL HIGH (ref 12.0–15.0)
MCH: 29.4 pg (ref 26.0–34.0)
MCHC: 33.3 g/dL (ref 30.0–36.0)
MCV: 88.3 fL (ref 80.0–100.0)
Platelets: 223 10*3/uL (ref 150–400)
RBC: 5.57 MIL/uL — ABNORMAL HIGH (ref 3.87–5.11)
RDW: 12.4 % (ref 11.5–15.5)
WBC: 9.6 10*3/uL (ref 4.0–10.5)
nRBC: 0 % (ref 0.0–0.2)

## 2019-06-30 LAB — SARS CORONAVIRUS 2 BY RT PCR (HOSPITAL ORDER, PERFORMED IN ~~LOC~~ HOSPITAL LAB): SARS Coronavirus 2: NEGATIVE

## 2019-06-30 LAB — HEPARIN LEVEL (UNFRACTIONATED): Heparin Unfractionated: 0.63 IU/mL (ref 0.30–0.70)

## 2019-06-30 LAB — GLUCOSE, CAPILLARY: Glucose-Capillary: 257 mg/dL — ABNORMAL HIGH (ref 70–99)

## 2019-06-30 LAB — TSH: TSH: 1.749 u[IU]/mL (ref 0.350–4.500)

## 2019-06-30 MED ORDER — INSULIN ASPART 100 UNIT/ML ~~LOC~~ SOLN: 0.0000 [IU] | SUBCUTANEOUS | Status: DC

## 2019-06-30 MED ORDER — LISINOPRIL 10 MG PO TABS
20.0000 mg | ORAL_TABLET | Freq: Every day | ORAL | Status: DC
  Administered 2019-07-01: 20 mg via ORAL
  Filled 2019-06-30: qty 2

## 2019-06-30 MED ORDER — ALBUTEROL SULFATE (2.5 MG/3ML) 0.083% IN NEBU: 2.5000 mg | INHALATION_SOLUTION | RESPIRATORY_TRACT | Status: DC | PRN

## 2019-06-30 MED ORDER — TRAMADOL HCL 50 MG PO TABS
50.0000 mg | ORAL_TABLET | Freq: Four times a day (QID) | ORAL | Status: DC | PRN
  Administered 2019-07-01: 50 mg via ORAL
  Filled 2019-06-30: qty 1

## 2019-06-30 MED ORDER — INSULIN ASPART 100 UNIT/ML ~~LOC~~ SOLN
0.0000 [IU] | Freq: Three times a day (TID) | SUBCUTANEOUS | Status: DC
  Administered 2019-07-01: 8 [IU] via SUBCUTANEOUS
  Administered 2019-07-01: 5 [IU] via SUBCUTANEOUS
  Administered 2019-07-01: 3 [IU] via SUBCUTANEOUS
  Administered 2019-07-02 (×2): 5 [IU] via SUBCUTANEOUS

## 2019-06-30 MED ORDER — HEPARIN (PORCINE) 25000 UT/250ML-% IV SOLN
1550.0000 [IU]/h | INTRAVENOUS | Status: DC
  Administered 2019-06-30 – 2019-07-02 (×4): 1550 [IU]/h via INTRAVENOUS
  Filled 2019-06-30 (×4): qty 250

## 2019-06-30 MED ORDER — INSULIN ASPART 100 UNIT/ML ~~LOC~~ SOLN
0.0000 [IU] | Freq: Every day | SUBCUTANEOUS | Status: DC
  Administered 2019-06-30: 23:00:00 3 [IU] via SUBCUTANEOUS

## 2019-06-30 MED ORDER — HEPARIN BOLUS VIA INFUSION
5000.0000 [IU] | Freq: Once | INTRAVENOUS | Status: AC
  Administered 2019-06-30: 5000 [IU] via INTRAVENOUS
  Filled 2019-06-30: qty 5000

## 2019-06-30 MED ORDER — ONDANSETRON HCL 4 MG PO TABS: 4.0000 mg | ORAL_TABLET | Freq: Four times a day (QID) | ORAL | Status: DC | PRN

## 2019-06-30 MED ORDER — ACETAMINOPHEN 650 MG RE SUPP: 650.0000 mg | Freq: Four times a day (QID) | RECTAL | Status: DC | PRN

## 2019-06-30 MED ORDER — ACETAMINOPHEN 325 MG PO TABS: 650.0000 mg | ORAL_TABLET | Freq: Four times a day (QID) | ORAL | Status: DC | PRN

## 2019-06-30 MED ORDER — SODIUM CHLORIDE 0.9 % IV BOLUS
1000.0000 mL | Freq: Once | INTRAVENOUS | Status: AC
  Administered 2019-06-30: 1000 mL via INTRAVENOUS

## 2019-06-30 MED ORDER — ONDANSETRON HCL 4 MG/2ML IJ SOLN: 4.0000 mg | Freq: Four times a day (QID) | INTRAMUSCULAR | Status: DC | PRN

## 2019-06-30 MED ORDER — FAMOTIDINE 20 MG PO TABS: 40.0000 mg | ORAL_TABLET | Freq: Every evening | ORAL | Status: DC | PRN

## 2019-06-30 MED ORDER — IOHEXOL 350 MG/ML SOLN
100.0000 mL | Freq: Once | INTRAVENOUS | Status: AC | PRN
  Administered 2019-06-30: 100 mL via INTRAVENOUS

## 2019-06-30 NOTE — ED Notes (Signed)
This RN called to tell 4E pt was coming to floor and check report had been called

## 2019-06-30 NOTE — Progress Notes (Signed)
ANTICOAGULATION CONSULT NOTE - Follow Up Consult  Pharmacy Consult for Heparin IV Indication: pulmonary embolus  Allergies  Allergen Reactions  . Farxiga [Dapagliflozin] Anaphylaxis  . Crestor [Rosuvastatin]     Hair Loss   . Penicillins Rash    Did it involve swelling of the face/tongue/throat, SOB, or low BP? No Did it involve sudden or severe rash/hives, skin peeling, or any reaction on the inside of your mouth or nose? No Did you need to seek medical attention at a hospital or doctor's office? No When did it last happen? If all above answers are "NO", may proceed with cephalosporin use.    Patient Measurements: Height: 5\' 10"  (177.8 cm) Weight: 266 lb 14.4 oz (121.1 kg) IBW/kg (Calculated) : 68.5 Heparin Dosing Weight: 96.3 kg  Vital Signs: Temp: 98.1 F (36.7 C) (07/20 2019) Temp Source: Oral (07/20 2019) BP: 120/81 (07/20 2019) Pulse Rate: 122 (07/20 2019)  Labs: Recent Labs    06/30/19 1240 06/30/19 2046 06/30/19 2104  HGB 16.4*  --   --   HCT 49.2*  --   --   PLT 223  --   --   HEPARINUNFRC  --   --  0.63  CREATININE 0.86  --   --   TROPONINIHS 270* 384*  --     Estimated Creatinine Clearance: 104.4 mL/min (by C-G formula based on SCr of 0.86 mg/dL).   Medications:  Infusions:  . heparin 1,500 Units/hr (06/30/19 1944)    Assessment: 53 year old female with massive pulmonary emboli and right heart strain on IV Heparin. Patient was not on anticoagulation prior to admission. Initial heparin level is therapeutic at upper end of range (0.63) on 1500 units/hr. No bleeding or issues reported.  Goal of Therapy:  Heparin level 0.3-0.7 units/ml Monitor platelets by anticoagulation protocol: Yes   Plan:  Continue current rate of 1500 units/hr.  Recheck Heparin level in 6 hours. Daily Heparin level and CBC while on therapy.   Sloan Leiter, PharmD, BCPS, BCCCP Clinical Pharmacist Please refer to [facility] for [place] numbers 06/30/2019,9:58  PM

## 2019-06-30 NOTE — ED Notes (Signed)
ED TO INPATIENT HANDOFF REPORT  ED Nurse Name and Phone #: Celene Squibb RN  S Name/Age/Gender Andrea Bush 53 y.o. female Room/Bed: 030C/030C  Code Status   Code Status: Full Code  Home/SNF/Other Home Patient oriented to: self, place, time and situation Is this baseline? Yes   Triage Complete: Triage complete  Chief Complaint [facility]  Triage Note Pt reports a heaviness and chest pain that started this morning. Pt reports using her daughters inhaler with some relief however the feeling came back and she came into the ED.    Allergies Allergies  Allergen Reactions  . Farxiga [Dapagliflozin] Anaphylaxis  . Crestor [Rosuvastatin]     Hair Loss   . Penicillins Rash    Did it involve swelling of the face/tongue/throat, SOB, or low BP? No Did it involve sudden or severe rash/hives, skin peeling, or any reaction on the inside of your mouth or nose? No Did you need to seek medical attention at a hospital or doctor's office? No When did it last happen? If all above answers are "NO", may proceed with cephalosporin use.    Level of Care/Admitting Diagnosis ED Disposition    ED Disposition Condition Comment   Admit  Hospital Area: [facility] [[id number]]  Level of Care: Progressive [102]  I expect the patient will be discharged within 24 hours: No (not a candidate for 5C-Observation unit)  Covid Evaluation: Asymptomatic Screening Protocol (No Symptoms)  Diagnosis: Pulmonary embolus [facility]) [[id number]]  Admitting Physician: Norval Morton [[id number]]  Attending Physician: Norval Morton [[id number]]  PT Class (Do Not Modify): Observation [104]  PT Acc Code (Do Not Modify): Observation [10022]       B Medical/Surgery History Past Medical History:  Diagnosis Date  . Fibrocystic breast   . Hyperlipidemia   . Hypertension   . Hypothyroidism   . Iron deficiency anemia   . Obesity    BMI 41  . Prediabetes   . Pulmonary embolism ([place]) 2006   from BCP   . Vitamin D deficiency    Past Surgical History:  Procedure Laterality Date  . ABLATION  2006  . THYROID SURGERY     Nodule benign  . TONSILLECTOMY AND ADENOIDECTOMY    . TUBAL LIGATION Bilateral      A IV Location/Drains/Wounds Patient Lines/Drains/Airways Status   Active Line/Drains/Airways    Name:   Placement date:   Placement time:   Site:   Days:   Peripheral IV 06/30/19 Left Antecubital   06/30/19    1535    Antecubital   less than 1   Peripheral IV 06/30/19   06/30/19    1430    -   less than 1          Intake/Output Last 24 hours  Intake/Output Summary (Last 24 hours) at 06/30/2019 1838 Last data filed at 06/30/2019 1710 Gross per 24 hour  Intake 1000 ml  Output -  Net 1000 ml    Labs/Imaging Results for orders placed or performed during the hospital encounter of 06/30/19 (from the past 48 hour(s))  Basic metabolic panel     Status: Abnormal   Collection Time: 06/30/19 12:40 PM  Result Value Ref Range   Sodium 136 135 - 145 mmol/L   Potassium 4.4 3.5 - 5.1 mmol/L   Chloride 104 98 - 111 mmol/L   CO2 22 22 - 32 mmol/L   Glucose, Bld 262 (H) 70 - 99 mg/dL   BUN 12 6 - 20 mg/dL  Creatinine, Ser 0.86 0.44 - 1.00 mg/dL   Calcium 9.2 8.9 - 10.3 mg/dL   GFR calc non Af Amer >60 >60 mL/min   GFR calc Af Amer >60 >60 mL/min   Anion gap 10 5 - 15    Comment: Performed at [facility] [address], [place], [place] [address]  CBC     Status: Abnormal   Collection Time: 06/30/19 12:40 PM  Result Value Ref Range   WBC 9.6 4.0 - 10.5 K/uL   RBC 5.57 (H) 3.87 - 5.11 MIL/uL   Hemoglobin 16.4 (H) 12.0 - 15.0 g/dL   HCT 49.2 (H) 36.0 - 46.0 %   MCV 88.3 80.0 - 100.0 fL   MCH 29.4 26.0 - 34.0 pg   MCHC 33.3 30.0 - 36.0 g/dL   RDW 12.4 11.5 - 15.5 %   Platelets 223 150 - 400 K/uL   nRBC 0.0 0.0 - 0.2 %    Comment: Performed at [facility], [place] [address] [place], [place] [address]  Troponin I (High Sensitivity)     Status: Abnormal   Collection  Time: 06/30/19 12:40 PM  Result Value Ref Range   Troponin I (High Sensitivity) 270 (HH) <18 ng/L    Comment: CRITICAL RESULT CALLED TO, READ BACK BY AND VERIFIED WITH: B LEAK RN AT 1335 ON [id number] BY K FORSYTH (NOTE) Elevated high sensitivity troponin I (hsTnI) values and significant  changes across serial measurements may suggest ACS but many other  chronic and acute conditions are known to elevate hsTnI results.  Refer to the Links section for chest pain algorithms and additional  guidance. Performed at [facility], [place] [address], [place],  [address]   I-Stat beta hCG blood, ED     Status: None   Collection Time: 06/30/19 12:43 PM  Result Value Ref Range   I-stat hCG, quantitative <5.0 <5 mIU/mL   Comment 3            Comment:   GEST. AGE      CONC.  (mIU/mL)   <=1 WEEK        5 - 50     2 WEEKS       50 - 500     3 WEEKS       100 - 10,000     4 WEEKS     1,000 - 30,000        FEMALE AND NON-PREGNANT FEMALE:     LESS THAN 5 mIU/mL   SARS Coronavirus 2 (CEPHEID - Performed in [place] hospital lab), Hosp Order     Status: None   Collection Time: 06/30/19  3:42 PM   Specimen: Nasopharyngeal Swab  Result Value Ref Range   SARS Coronavirus 2 NEGATIVE NEGATIVE    Comment: (NOTE) If result is NEGATIVE SARS-CoV-2 target nucleic acids are NOT DETECTED. The SARS-CoV-2 RNA is generally detectable in upper and lower  respiratory specimens during the acute phase of infection. The lowest  concentration of SARS-CoV-2 viral copies this assay can detect is 250  copies / mL. A negative result does not preclude SARS-CoV-2 infection  and should not be used as the sole basis for treatment or other  patient management decisions.  A negative result may occur with  improper specimen collection / handling, submission of specimen other  than nasopharyngeal swab, presence of viral mutation(s) within the  areas targeted by this assay, and inadequate number of viral copies   (<250  copies / mL). A negative result must be combined with clinical  observations, patient history, and epidemiological information. If result is POSITIVE SARS-CoV-2 target nucleic acids are DETECTED. The SARS-CoV-2 RNA is generally detectable in upper and lower  respiratory specimens dur ing the acute phase of infection.  Positive  results are indicative of active infection with SARS-CoV-2.  Clinical  correlation with patient history and other diagnostic information is  necessary to determine patient infection status.  Positive results do  not rule out bacterial infection or co-infection with other viruses. If result is PRESUMPTIVE POSTIVE SARS-CoV-2 nucleic acids MAY BE PRESENT.   A presumptive positive result was obtained on the submitted specimen  and confirmed on repeat testing.  While 2019 novel coronavirus  (SARS-CoV-2) nucleic acids may be present in the submitted sample  additional confirmatory testing may be necessary for epidemiological  and / or clinical management purposes  to differentiate between  SARS-CoV-2 and other Sarbecovirus currently known to infect humans.  If clinically indicated additional testing with an alternate test  methodology [phone]) is advised. The SARS-CoV-2 RNA is generally  detectable in upper and lower respiratory sp ecimens during the acute  phase of infection. The expected result is Negative. Fact Sheet for Patients:  StrictlyIdeas.no Fact Sheet for Healthcare Providers: BankingDealers.co.za This test is not yet approved or cleared by the [place] FDA and has been authorized for detection and/or diagnosis of SARS-CoV-2 by FDA under an Emergency Use Authorization (EUA).  This EUA will remain in effect (meaning this test can be used) for the duration of the COVID-19 declaration under Section 564(b)(1) of the Act, 21 U.S.C. section 360bbb-3(b)(1), unless the authorization is terminated  or revoked sooner. Performed at [facility], [place] [address] [place], [place] [address]    Dg Chest 2 View  Result Date: 06/30/2019 CLINICAL DATA:  Chest pain.  History of pulmonary embolism. EXAM: CHEST - 2 VIEW COMPARISON:  01/21/2005 CT chest report.  No prior chest radiograph. FINDINGS: Midline trachea.  Normal heart size and mediastinal contours. Sharp costophrenic angles.  No pneumothorax.  Clear lungs. Mild convex right thoracic spine curvature. IMPRESSION: No active cardiopulmonary disease. Electronically Signed   By: Abigail Miyamoto M.D.   On: 06/30/2019 12:44   Ct Angio Chest Pe W And/or Wo Contrast  Result Date: 06/30/2019 CLINICAL DATA:  Shortness of breath today. History of DVT and pulmonary embolism. EXAM: CT ANGIOGRAPHY CHEST WITH CONTRAST TECHNIQUE: Multidetector CT imaging of the chest was performed using the standard protocol during bolus administration of intravenous contrast. Multiplanar CT image reconstructions and MIPs were obtained to evaluate the vascular anatomy. CONTRAST:  180mL OMNIPAQUE IOHEXOL 350 MG/ML SOLN COMPARISON:  Chest radiographs obtained earlier today. Chest CTA report dated 01/21/2005. FINDINGS: Cardiovascular: Multiple large and moderate-sized bilateral pulmonary arterial filling defects, including large defects in both main pulmonary arteries, with almost complete occlusion of the left main pulmonary artery. Associated enlargement of the right ventricle and right atrium a right ventricular to left ventricular ratio of 2.3. Mediastinum/Nodes: No enlarged mediastinal, hilar, or axillary lymph nodes. Thyroid gland, trachea, and esophagus demonstrate no significant findings. Lungs/Pleura: Lungs are clear. No pleural effusion or pneumothorax. Upper Abdomen: Refluxed contrast into the inferior vena cava and hepatic veins. Musculoskeletal: Mild thoracic spine degenerative changes. Review of the MIP images confirms the above findings. IMPRESSION: Massive bilateral  pulmonary emboli with marked right heart strain with a right ventricular to left ventricular ratio of 2.3. There is associated reflux of contrast into the  inferior vena cava and hepatic veins. The presence of right heart strain has been associated with an increased risk of morbidity and mortality. Please activate Code PE by paging [phone]. Critical Value/emergent results were called by telephone at the time of interpretation on 06/30/2019 at 3:47 pm to Dr. Melina Copa, who verbally acknowledged these results. Electronically Signed   By: Claudie Revering M.D.   On: 06/30/2019 15:51    Pending Labs Unresulted Labs (From admission, onward)    Start     Ordered   07/01/19 0500  Heparin level (unfractionated)  Daily,   R     06/30/19 1439   07/01/19 0500  CBC  Daily,   R     06/30/19 1439   07/01/19 2841  Basic metabolic panel  Tomorrow morning,   R     06/30/19 1715   06/30/19 2100  Heparin level (unfractionated)  Once-Timed,   STAT     06/30/19 1439   06/30/19 1712  HIV antibody (Routine Testing)  Once,   STAT     06/30/19 1715   06/30/19 1634  TSH  Once,   STAT     06/30/19 1633          Vitals/Pain Today's Vitals   06/30/19 1745 06/30/19 1748 06/30/19 1800 06/30/19 1815  BP:      Pulse: (!) 120  (!) 124 (!) 124  Resp: 16  18 20   Temp:      TempSrc:      SpO2: 96%  95% 95%  Weight:      Height:      PainSc:  0-No pain      Isolation Precautions No active isolations  Medications Medications  heparin ADULT infusion 100 units/mL (25000 units/26mL sodium chloride 0.45%) (1,550 Units/hr Intravenous New Bag/Given 06/30/19 1451)  famotidine (PEPCID) tablet 40 mg (has no administration in time range)  lisinopril (ZESTRIL) tablet 20 mg (has no administration in time range)  ondansetron (ZOFRAN) tablet 4 mg (has no administration in time range)    Or  ondansetron (ZOFRAN) injection 4 mg (has no administration in time range)  acetaminophen (TYLENOL) tablet 650 mg (has no administration  in time range)    Or  acetaminophen (TYLENOL) suppository 650 mg (has no administration in time range)  albuterol (PROVENTIL) (2.5 MG/3ML) 0.083% nebulizer solution 2.5 mg (has no administration in time range)  traMADol (ULTRAM) tablet 50 mg (has no administration in time range)  insulin aspart (novoLOG) injection 0-15 Units (has no administration in time range)  insulin aspart (novoLOG) injection 0-5 Units (has no administration in time range)  sodium chloride 0.9 % bolus 1,000 mL (0 mLs Intravenous Stopped 06/30/19 1710)  heparin bolus via infusion 5,000 Units (5,000 Units Intravenous Bolus from Bag 06/30/19 1453)  iohexol (OMNIPAQUE) 350 MG/ML injection 100 mL (100 mLs Intravenous Contrast Given 06/30/19 1539)    Mobility walks Low fall risk   Focused Assessments Cardiac Assessment Handoff:  Cardiac Rhythm: Sinus tachycardia No results found for: CKTOTAL, CKMB, CKMBINDEX, TROPONINI Lab Results  Component Value Date   DDIMER 0.97 (H) 10/06/2015   Does the Patient currently have chest pain? No     R Recommendations: See Admitting Provider Note  Report given to:   Additional Notes:

## 2019-06-30 NOTE — H&P (Signed)
History and Physical    Andrea Bush JSE:[id number] DOB: [birth year] DOA: 06/30/2019  Referring MD/NP/PA: Aletta Edouard, MD PCP: Unk Pinto, MD  Patient coming from: Home  Chief Complaint: Chest heaviness and shortness of breath  I have personally briefly reviewed patient's old medical records in [place]   HPI: Andrea Bush is a 53 y.o. female with medical history significant of HTN, HLD, hypothyroidism, diabetes mellitus type 2, and pulmonary embolus not on anticoagulation; who initially presented with chest heaviness and shortness of breath.  Symptoms started around 9 this morning when she was climbing flight of stairs.  Associated symptoms included dizziness, no loss of consciousness.  She recalls some redness in her left leg approximately 1 month ago that resolved on its own.  Otherwise denies having any leg swelling, calf pain, recent travel, fever, nausea, vomiting, or diarrhea.  She previously had a blood clot back in 2006 that may have been thought to be related to birth control pills.  She reports knowing what he feels to have blood clots in her legs.  ED Course: Upon admission into the emergency department patient was noted to be afebrile, heart rate 125-135, blood pressure 111/77-118/102, and O2 saturations maintained on room air.  Labs revealed hemoglobin 16.4 and glucose 262.  CT angiogram of the chest revealed massive pulmonary embolus with signs of right heart strain.   ROS  Past Medical History:  Diagnosis Date   Fibrocystic breast    Hyperlipidemia    Hypertension    Hypothyroidism    Iron deficiency anemia    Obesity    BMI 41   Prediabetes    Pulmonary embolism ([place]) 2006   from BCP   Vitamin D deficiency     Past Surgical History:  Procedure Laterality Date   ABLATION  2006   THYROID SURGERY     Nodule benign   TONSILLECTOMY AND ADENOIDECTOMY     TUBAL LIGATION Bilateral      reports that she has never smoked. She has  never used smokeless tobacco. She reports that she does not drink alcohol or use drugs.  Allergies  Allergen Reactions   Farxiga [Dapagliflozin] Anaphylaxis   Crestor [Rosuvastatin]     Hair Loss    Penicillins Rash    Did it involve swelling of the face/tongue/throat, SOB, or low BP? No Did it involve sudden or severe rash/hives, skin peeling, or any reaction on the inside of your mouth or nose? No Did you need to seek medical attention at a hospital or doctor's office? No When did it last happen? If all above answers are NO, may proceed with cephalosporin use.    Family History  Problem Relation Age of Onset   Cancer Mother        Cancer   Osteoporosis Mother    Diabetes Father     Prior to Admission medications   Medication Sig Start Date End Date Taking? Authorizing Provider  acetaminophen (TYLENOL) 500 MG tablet Take 1,000 mg by mouth every 6 (six) hours as needed for mild pain.   Yes [provider]  aspirin EC 81 MG tablet Take 81 mg by mouth daily.   Yes [provider]  cetirizine (ZYRTEC) 10 MG tablet Take 10 mg by mouth daily.   Yes [provider]  Cholecalciferol 5000 units capsule Take 5,000 Units by mouth daily.   Yes [provider]  empagliflozin (JARDIANCE) 25 MG TABS tablet Take 1 tablet Daily for Diabetes Patient taking differently:  Take 25 mg by mouth daily. Take 1 tablet Daily for Diabetes 06/11/19  Yes Vicie Mutters, PA-C  famotidine (PEPCID) 40 MG tablet Take 1 tablet (40 mg total) by mouth every evening. Patient taking differently: Take 40 mg by mouth daily as needed for heartburn or indigestion.  04/15/19 04/14/20 Yes Vicie Mutters, PA-C  lisinopril (ZESTRIL) 20 MG tablet TAKE 1 TABLET BY MOUTH EVERY DAY Patient taking differently: Take 20 mg by mouth daily.  06/25/19  Yes Vicie Mutters, PA-C  metFORMIN (GLUCOPHAGE-XR) 500 MG 24 hr tablet START BY TAKING 1 TABLET BY MOUTH WITH DINNER. Patient taking  differently: Take 500 mg by mouth every evening. START BY TAKING 1 TABLET BY MOUTH WITH DINNER. 06/25/19  Yes Vicie Mutters, PA-C    Physical Exam:  Constitutional: NAD, calm, comfortable Vitals:   06/30/19 1415 06/30/19 1500 06/30/19 1545 06/30/19 1600  BP: (!) 118/102 113/84 112/68 113/84  Pulse: (!) 133  (!) 125 (!) 125  Resp: 19 13 16 16   Temp:      TempSrc:      SpO2: 93%  95% 93%  Weight:      Height:       Eyes: PERRL, lids and conjunctivae normal ENMT: Mucous membranes are moist. Posterior pharynx clear of any exudate or lesions.Normal dentition.  Neck: normal, supple, no masses, no thyromegaly Respiratory: clear to auscultation bilaterally, no wheezing, no crackles. Normal respiratory effort. No accessory muscle use.  Cardiovascular: Regular rate and rhythm, no murmurs / rubs / gallops. No extremity edema. 2+ pedal pulses. No carotid bruits.  Abdomen: no tenderness, no masses palpated. No hepatosplenomegaly. Bowel sounds positive.  Musculoskeletal: no clubbing / cyanosis. No joint deformity upper and lower extremities. Good ROM, no contractures. Normal muscle tone.  Skin: no rashes, lesions, ulcers. No induration Neurologic: CN 2-12 grossly intact. Sensation intact, DTR normal. Strength 5/5 in all 4.  Psychiatric: Normal judgment and insight. Alert and oriented x 3. Normal mood.     Labs on Admission: I have personally reviewed following labs and imaging studies  CBC: Recent Labs  Lab 06/30/19 1240  WBC 9.6  HGB 16.4*  HCT 49.2*  MCV 88.3  PLT 253   Basic Metabolic Panel: Recent Labs  Lab 06/30/19 1240  NA 136  K 4.4  CL 104  CO2 22  GLUCOSE 262*  BUN 12  CREATININE 0.86  CALCIUM 9.2   GFR: Estimated Creatinine Clearance: 105.1 mL/min (by C-G formula based on SCr of 0.86 mg/dL). Liver Function Tests: No results for input(s): AST, ALT, ALKPHOS, BILITOT, PROT, ALBUMIN in the last 168 hours. No results for input(s): LIPASE, AMYLASE in the last 168  hours. No results for input(s): AMMONIA in the last 168 hours. Coagulation Profile: No results for input(s): INR, PROTIME in the last 168 hours. Cardiac Enzymes: No results for input(s): CKTOTAL, CKMB, CKMBINDEX, TROPONINI in the last 168 hours. BNP (last 3 results) No results for input(s): PROBNP in the last 8760 hours. HbA1C: No results for input(s): HGBA1C in the last 72 hours. CBG: No results for input(s): GLUCAP in the last 168 hours. Lipid Profile: No results for input(s): CHOL, HDL, LDLCALC, TRIG, CHOLHDL, LDLDIRECT in the last 72 hours. Thyroid Function Tests: No results for input(s): TSH, T4TOTAL, FREET4, T3FREE, THYROIDAB in the last 72 hours. Anemia Panel: No results for input(s): VITAMINB12, FOLATE, FERRITIN, TIBC, IRON, RETICCTPCT in the last 72 hours. Urine analysis:    Component Value Date/Time   COLORURINE YELLOW 12/25/2018 [place] 12/25/2018  1158   LABSPEC 1.035 12/25/2018 1158   PHURINE < OR = 5.0 12/25/2018 1158   GLUCOSEU 3+ (A) 12/25/2018 1158   HGBUR NEGATIVE 12/25/2018 1158   BILIRUBINUR NEGATIVE 10/05/2016 1045   KETONESUR NEGATIVE 12/25/2018 1158   PROTEINUR NEGATIVE 12/25/2018 1158   UROBILINOGEN 0.2 09/30/2014 1144   NITRITE NEGATIVE 12/25/2018 1158   LEUKOCYTESUR NEGATIVE 12/25/2018 1158   Sepsis Labs: No results found for this or any previous visit (from the past 240 hour(s)).   Radiological Exams on Admission: Dg Chest 2 View  Result Date: 06/30/2019 CLINICAL DATA:  Chest pain.  History of pulmonary embolism. EXAM: CHEST - 2 VIEW COMPARISON:  01/21/2005 CT chest report.  No prior chest radiograph. FINDINGS: Midline trachea.  Normal heart size and mediastinal contours. Sharp costophrenic angles.  No pneumothorax.  Clear lungs. Mild convex right thoracic spine curvature. IMPRESSION: No active cardiopulmonary disease. Electronically Signed   By: Abigail Miyamoto M.D.   On: 06/30/2019 12:44   Ct Angio Chest Pe W And/or Wo  Contrast  Result Date: 06/30/2019 CLINICAL DATA:  Shortness of breath today. History of DVT and pulmonary embolism. EXAM: CT ANGIOGRAPHY CHEST WITH CONTRAST TECHNIQUE: Multidetector CT imaging of the chest was performed using the standard protocol during bolus administration of intravenous contrast. Multiplanar CT image reconstructions and MIPs were obtained to evaluate the vascular anatomy. CONTRAST:  12mL OMNIPAQUE IOHEXOL 350 MG/ML SOLN COMPARISON:  Chest radiographs obtained earlier today. Chest CTA report dated 01/21/2005. FINDINGS: Cardiovascular: Multiple large and moderate-sized bilateral pulmonary arterial filling defects, including large defects in both main pulmonary arteries, with almost complete occlusion of the left main pulmonary artery. Associated enlargement of the right ventricle and right atrium a right ventricular to left ventricular ratio of 2.3. Mediastinum/Nodes: No enlarged mediastinal, hilar, or axillary lymph nodes. Thyroid gland, trachea, and esophagus demonstrate no significant findings. Lungs/Pleura: Lungs are clear. No pleural effusion or pneumothorax. Upper Abdomen: Refluxed contrast into the inferior vena cava and hepatic veins. Musculoskeletal: Mild thoracic spine degenerative changes. Review of the MIP images confirms the above findings. IMPRESSION: Massive bilateral pulmonary emboli with marked right heart strain with a right ventricular to left ventricular ratio of 2.3. There is associated reflux of contrast into the inferior vena cava and hepatic veins. The presence of right heart strain has been associated with an increased risk of morbidity and mortality. Please activate Code PE by paging [phone]. Critical Value/emergent results were called by telephone at the time of interpretation on 06/30/2019 at 3:47 pm to Dr. Melina Copa, who verbally acknowledged these results. Electronically Signed   By: Claudie Revering M.D.   On: 06/30/2019 15:51    EKG: Independently reviewed.  Sinus  tachycardia 125 bpm  Assessment/Plan Bilateral pulmonary embolus with signs of right heart strain on CT angiogram acute.  Patient presents with complaints of chest heaviness and shortness of breath.  CT angiogram reveals massive PE with right heart strain.  With previous history of pulmonary embolus back in 2006 that may have been thought to be related to OCPs.  Patient currently maintaining O2 saturations on room air and vital signs otherwise stable.  As this is patient's second occurrence of pulmonary embolus she warrants lifelong anticoagulation. -Admit to a progressive bed -Continuous pulse oximetry overnight with nasal cannula oxygen as needed -Heparin drip per pharmacy -Check lower extremity Doppler ultrasound and echocardiogram -IR was consulted by PCCM  Elevated troponin: Acute. Troponin noted to be 270 on admission.  Secondary to above. -Continue to monitor  Diabetes mellitus  type 2: Uncontrolled.  On admission blood glucose elevated to 262.  Last hemoglobin A1c noted to be 10 in 04/2019.  Home medications include metformin and Jardiance -Hypoglycemic protocol -Hold Jardiance and metformin  -CBGs q. before meals with moderate SSI.  Essential hypertension: Blood pressures currently stable.  -Continue lisinopril  GERD -Pepcid as needed  Obesity: BMI 38.74 kg/m -Continue counseling on need of weight loss  DVT prophylaxis: Heparin Code Status: Full Family Communication: Patient declines need to update family Disposition Plan: Possible discharge home in 1 to 3 days Consults called: PCCM Admission status: Observation  Norval Morton MD [facility] Pager [phone]   If 7PM-7AM, please contact night-coverage [email or web address] Password [email or web address]  06/30/2019, 4:53 PM

## 2019-06-30 NOTE — Progress Notes (Signed)
ANTICOAGULATION CONSULT NOTE - Initial Consult  Pharmacy Consult for heparin Indication: VTE   Patient Measurements: Height: 5\' 10"  (177.8 cm) Weight: 270 lb (122.5 kg) IBW/kg (Calculated) : 68.5 Heparin Dosing Weight: 96.7 kg  Vital Signs: Temp: 97.7 F (36.5 C) (07/20 1202) Temp Source: Oral (07/20 1202) BP: 111/77 (07/20 1410) Pulse Rate: 131 (07/20 1410)  Labs: Recent Labs    06/30/19 1240  HGB 16.4*  HCT 49.2*  PLT 223  CREATININE 0.86  TROPONINIHS 270*     Medical History: Past Medical History:  Diagnosis Date  . Fibrocystic breast   . Hyperlipidemia   . Hypertension   . Hypothyroidism   . Iron deficiency anemia   . Obesity    BMI 41  . Prediabetes   . Pulmonary embolism ([place]) 2006   from BCP  . Vitamin D deficiency      Assessment: 53 yo female with SOB. Patient reports symptoms similar to past VTE. Troponin positive, d-dimer positive. Planning to start heparin while confirming VTE with imaging. CBC wnl. SCr 0.8.    Goal of Therapy:  Heparin level 0.3-0.7 units/ml Monitor platelets by anticoagulation protocol: Yes    Plan:  -Heparin bolus 5000 units then 1550 units/hr -Daily HL, CBC -Monitor HL, CBC -Check level this evening   Khila Papp, Jake Church 06/30/2019,2:27 PM

## 2019-06-30 NOTE — ED Notes (Signed)
Pt resting comfortably at this time.  Admission orders written, waiting for bed assignment.

## 2019-06-30 NOTE — ED Notes (Addendum)
Called pharmacy for Heparin rate verification. Changed Rate Heparin to 1500units/hr. MD Notified.

## 2019-06-30 NOTE — ED Provider Notes (Signed)
[facility] EMERGENCY DEPARTMENT Provider Note   CSN: [id number] Arrival date & time: 06/30/19  1149    History   Chief Complaint Chief Complaint  Patient presents with  . Chest Pain  . Shortness of Breath    HPI Andrea Bush is a 53 y.o. female.     HPI  53 year old female presents with shortness of breath.  Started around 9 AM.  She was walking up the stairs and at the top of the stairs she was much more short of breath than typical.  The shortness of breath is mostly exertional and currently at rest she feels okay.  She was also dizzy.  She had a pulmonary embolism about 14 years ago that they think was due to medications and she is not currently on anticoagulation.  No leg pain or swelling though about a month ago she noticed some bruising to her left medial thigh.  No chest pain currently though earlier in the day she had some chest heaviness.  Past Medical History:  Diagnosis Date  . Fibrocystic breast   . Hyperlipidemia   . Hypertension   . Hypothyroidism   . Iron deficiency anemia   . Obesity    BMI 41  . Prediabetes   . Pulmonary embolism ([place]) 2006   from BCP  . Vitamin D deficiency     Patient Active Problem List   Diagnosis Date Noted  . History of pulmonary embolus (PE) 04/15/2019  . Morbid obesity ([place]) 09/30/2014  . Anemia, iron deficiency 09/30/2014  . Fibrocystic breast 09/30/2014  . Hyperlipidemia   . Vitamin D deficiency   . Hypertension   . Type 2 diabetes mellitus with hyperlipidemia [facility])     Past Surgical History:  Procedure Laterality Date  . ABLATION  2006  . THYROID SURGERY     Nodule benign  . TONSILLECTOMY AND ADENOIDECTOMY    . TUBAL LIGATION Bilateral      OB History   No obstetric history on file.      Home Medications    Prior to Admission medications   Medication Sig Start Date End Date Taking? Authorizing Provider  acetaminophen (TYLENOL) 500 MG tablet Take 1,000 mg by mouth every 6 (six)  hours as needed for mild pain.   Yes [provider]  aspirin EC 81 MG tablet Take 81 mg by mouth daily.   Yes [provider]  cetirizine (ZYRTEC) 10 MG tablet Take 10 mg by mouth daily.   Yes [provider]  Cholecalciferol 5000 units capsule Take 5,000 Units by mouth daily.   Yes [provider]  empagliflozin (JARDIANCE) 25 MG TABS tablet Take 1 tablet Daily for Diabetes Patient taking differently: Take 25 mg by mouth daily. Take 1 tablet Daily for Diabetes 06/11/19  Yes Vicie Mutters, PA-C  famotidine (PEPCID) 40 MG tablet Take 1 tablet (40 mg total) by mouth every evening. Patient taking differently: Take 40 mg by mouth daily as needed for heartburn or indigestion.  04/15/19 04/14/20 Yes Vicie Mutters, PA-C  lisinopril (ZESTRIL) 20 MG tablet TAKE 1 TABLET BY MOUTH EVERY DAY Patient taking differently: Take 20 mg by mouth daily.  06/25/19  Yes Vicie Mutters, PA-C  metFORMIN (GLUCOPHAGE-XR) 500 MG 24 hr tablet START BY TAKING 1 TABLET BY MOUTH WITH DINNER. Patient taking differently: Take 500 mg by mouth every evening. START BY TAKING 1 TABLET BY MOUTH WITH DINNER. 06/25/19  Yes Vicie Mutters, PA-C    Family History Family History  Problem Relation Age of Onset  . Cancer Mother        Cancer  . Osteoporosis Mother   . Diabetes Father     Social History Social History   Tobacco Use  . Smoking status: Never Smoker  . Smokeless tobacco: Never Used  Substance Use Topics  . Alcohol use: No  . Drug use: No     Allergies   Farxiga [dapagliflozin], Crestor [rosuvastatin], and Penicillins   Review of Systems Review of Systems  Constitutional: Negative for fever.  Respiratory: Positive for shortness of breath. Negative for cough.   Cardiovascular: Positive for chest pain. Negative for leg swelling.  Neurological: Positive for light-headedness.  All other systems reviewed and are negative.    Physical Exam Updated Vital Signs BP 113/84    Pulse (!) 133   Temp 97.7 F (36.5 C) (Oral)   Resp 13   Ht 5\' 10"  (1.778 m)   Wt 122.5 kg   LMP  (Exact Date)   SpO2 93%   BMI 38.74 kg/m   Physical Exam Vitals signs and nursing note reviewed.  Constitutional:      Appearance: She is well-developed. She is obese.  HENT:     Head: Normocephalic and atraumatic.     Right Ear: External ear normal.     Left Ear: External ear normal.     Nose: Nose normal.  Eyes:     General:        Right eye: No discharge.        Left eye: No discharge.  Cardiovascular:     Rate and Rhythm: Regular rhythm. Tachycardia present.     Heart sounds: Normal heart sounds.  Pulmonary:     Effort: Pulmonary effort is normal.     Breath sounds: Normal breath sounds.  Abdominal:     Palpations: Abdomen is soft.     Tenderness: There is no abdominal tenderness.  Musculoskeletal:     Right lower leg: No edema.     Left lower leg: No edema.  Skin:    General: Skin is warm and dry.  Neurological:     Mental Status: She is alert.  Psychiatric:        Mood and Affect: Mood is not anxious.      ED Treatments / Results  Labs (all labs ordered are listed, but only abnormal results are displayed) Labs Reviewed  BASIC METABOLIC PANEL - Abnormal; Notable for the following components:      Result Value   Glucose, Bld 262 (*)    All other components within normal limits  CBC - Abnormal; Notable for the following components:   RBC 5.57 (*)    Hemoglobin 16.4 (*)    HCT 49.2 (*)    All other components within normal limits  TROPONIN I (HIGH SENSITIVITY) - Abnormal; Notable for the following components:   Troponin I (High Sensitivity) 270 (*)    All other components within normal limits  SARS CORONAVIRUS 2 (HOSPITAL ORDER, [place] [facility])  HEPARIN LEVEL (UNFRACTIONATED)  I-STAT BETA HCG BLOOD, ED ([facility], [facility], [facility] ONLY)  TROPONIN I (HIGH SENSITIVITY)    EKG EKG Interpretation  Date/Time:  Monday June 30 2019 14:49:40 EDT  Ventricular Rate:  125 PR Interval:  128 QRS Duration: 93 QT Interval:  329 QTC Calculation: 475 R Axis:   90 Text Interpretation:  Sinus tachycardia Borderline right axis deviation Low voltage, precordial leads Borderline T abnormalities, anterior leads similar to prior today  Confirmed by Aletta Edouard [phone]) on 06/30/2019 3:21:41 PM   Radiology Dg Chest 2 View  Result Date: 06/30/2019 CLINICAL DATA:  Chest pain.  History of pulmonary embolism. EXAM: CHEST - 2 VIEW COMPARISON:  01/21/2005 CT chest report.  No prior chest radiograph. FINDINGS: Midline trachea.  Normal heart size and mediastinal contours. Sharp costophrenic angles.  No pneumothorax.  Clear lungs. Mild convex right thoracic spine curvature. IMPRESSION: No active cardiopulmonary disease. Electronically Signed   By: Abigail Miyamoto M.D.   On: 06/30/2019 12:44    Procedures .Critical Care Performed by: Sherwood Gambler, MD Authorized by: Sherwood Gambler, MD   Critical care provider statement:    Critical care time (minutes):  35   Critical care time was exclusive of:  Separately billable procedures and treating other patients   Critical care was necessary to treat or prevent imminent or life-threatening deterioration of the following conditions:  Shock and cardiac failure   Critical care was time spent personally by me on the following activities:  Discussions with consultants, evaluation of patient's response to treatment, examination of patient, ordering and performing treatments and interventions, ordering and review of laboratory studies, ordering and review of radiographic studies, pulse oximetry, re-evaluation of patient's condition, obtaining history from patient or surrogate and review of old charts Ultrasound ED Echo  Date/Time: 06/30/2019 3:07 PM Performed by: Sherwood Gambler, MD Authorized by: Sherwood Gambler, MD   Procedure details:    Indications: dyspnea     Views: subxiphoid and parasternal long axis view      Images: archived     Limitations:  Body habitus and positioning Findings:    Pericardium: no pericardial effusion     Cardiac Activity: hyperdynamic     RV Diameter: normal   Impression:    Impression: high-output state     (including critical care time)  Angiocath insertion Performed by: Ephraim Hamburger  Consent: Verbal consent obtained. Risks and benefits: risks, benefits and alternatives were discussed Time out: Immediately prior to procedure a "time out" was called to verify the correct patient, procedure, equipment, support staff and site/side marked as required.  Preparation: Patient was prepped and draped in the usual sterile fashion.  Vein Location: left basilic  Ultrasound Guided  Gauge: 20  Normal blood return and flush without difficulty Patient tolerance: Patient tolerated the procedure well with no immediate complications.  Medications Ordered in ED Medications  heparin ADULT infusion 100 units/mL (25000 units/257mL sodium chloride 0.45%) (1,550 Units/hr Intravenous New Bag/Given 06/30/19 1451)  sodium chloride 0.9 % bolus 1,000 mL (1,000 mLs Intravenous New Bag/Given 06/30/19 1443)  heparin bolus via infusion 5,000 Units (5,000 Units Intravenous Bolus from Bag 06/30/19 1453)     Initial Impression / Assessment and Plan / ED Course  I have reviewed the triage vital signs and the nursing notes.  Pertinent labs & imaging results that were available during my care of the patient were reviewed by me and considered in my medical decision making (see chart for details).        I have high concern for PE. Thus she will be started on heparin, and have ordered stat CT. I think her troponin elevation is likely from probable PE. If CT is negative, consider ACS, but will need admission either way. Care to Dr. Melina Copa with CT pending.  Final Clinical Impressions(s) / ED Diagnoses   Final diagnoses:  None    ED Discharge Orders    None       Sherwood Gambler, MD  06/30/19 1540  

## 2019-06-30 NOTE — Plan of Care (Signed)
  Problem: Health Behavior/Discharge Planning: Goal: Ability to manage health-related needs will improve Outcome: Progressing   Problem: Clinical Measurements: Goal: Will remain free from infection Outcome: Progressing   

## 2019-06-30 NOTE — ED Notes (Signed)
Reports SOB starting today with no other related sx of Covid.  Does have hx of DVT and PE and feels similar.  C/O pressure to chest, midsternal without radiation.  No edema note to LE.

## 2019-06-30 NOTE — ED Provider Notes (Signed)
53 year old female with prior history of DVT PE complaining of increased shortness of breath and some chest pressure similar to prior PE.  She is currently not on anticoagulation.  She is tachycardic and mildly hypoxic. Physical Exam  BP (!) 118/102   Pulse (!) 133   Temp 97.7 F (36.5 C) (Oral)   Resp 19   Ht 5\' 10"  (1.778 m)   Wt 122.5 kg   LMP  (Exact Date)   SpO2 93%   BMI 38.74 kg/m   Physical Exam  ED Course/Procedures   Clinical Course as of Jun 29 1624  Mon Jun 30, 2019  1551 I received a call from radiology that the patient had a massive PE bilateral with almost complete occlusion of the left pulmonary artery.  He also commented upon right heart strain.  I went and evaluated the patient and updated her on this she is resting comfortably not on any supplemental oxygen with a normal respiratory pattern.  She still remains tachycardic in the 120s.   [MB]  8786 Discussed with critical care Dr. Elsworth Soho who does not feel the patient needs the ICU at this time and thinks heparin is still indicated as treatment.  He said they will consult on the patient.  I placed a call in for the hospitalist service for admission.  Patient has been updated.   [MB]  98 Discussed with Dr. Tamala Julian from the hospitalist service who will evaluate the patient for admission.   [MB]    Clinical Course User Index [MB] Hayden Rasmussen, MD    .Critical Care Performed by: Hayden Rasmussen, MD Authorized by: Hayden Rasmussen, MD   Critical care provider statement:    Critical care time (minutes):  30   Critical care was necessary to treat or prevent imminent or life-threatening deterioration of the following conditions:  Circulatory failure and respiratory failure   Critical care was time spent personally by me on the following activities:  Discussions with consultants, evaluation of patient's response to treatment, examination of patient, ordering and performing treatments and interventions, ordering and  review of laboratory studies, ordering and review of radiographic studies, pulse oximetry, re-evaluation of patient's condition, review of old charts and development of treatment plan with patient or surrogate   I assumed direction of critical care for this patient from another provider in my specialty: yes      MDM  Patient currently on heparin.  She is pending her CT NGO.  Plan will be to follow-up on CT and get her admitted.       Hayden Rasmussen, MD 07/01/19 [phone]

## 2019-06-30 NOTE — Progress Notes (Signed)
Patient arrived to unit. VSS stable. Patient able to walk to bed; some SOB noted; patient placed on telemetry, CCMD notified. Patient oriented to room and equipment

## 2019-06-30 NOTE — ED Triage Notes (Signed)
Pt reports a heaviness and chest pain that started this morning. Pt reports using her daughters inhaler with some relief however the feeling came back and she came into the ED.

## 2019-06-30 NOTE — Telephone Encounter (Signed)
Patient called and spoke with a nurse.  She is morbidly obese, has history of DVT/PE in the past.  She is very SOB this morning with chest heaviness, she had SOB with exertion up the stairs, used her daughters inhaler which helped some but caused her to be shaky.  She was having trouble completing full sentences.  She was advised to call the ER and let them know she was coming to rule out COVID, PE, MI, etc.   Medications Current Outpatient Medications on File Prior to Visit  Medication Sig  . cetirizine (ZYRTEC) 10 MG tablet Take 10 mg by mouth daily.  . Cholecalciferol 5000 units capsule Take 5,000 Units by mouth daily.  . empagliflozin (JARDIANCE) 25 MG TABS tablet Take 1 tablet Daily for Diabetes  . famotidine (PEPCID) 40 MG tablet Take 1 tablet (40 mg total) by mouth every evening.  Marland Kitchen lisinopril (ZESTRIL) 20 MG tablet TAKE 1 TABLET BY MOUTH EVERY DAY  . metFORMIN (GLUCOPHAGE-XR) 500 MG 24 hr tablet START BY TAKING 1 TABLET BY MOUTH WITH DINNER.   No current facility-administered medications on file prior to visit.     Problem list She has Hyperlipidemia; Vitamin D deficiency; Hypertension; Type 2 diabetes mellitus with hyperlipidemia ([place]); Morbid obesity ([place]); Anemia, iron deficiency; Fibrocystic breast; and History of pulmonary embolus (PE) on their problem list.

## 2019-06-30 NOTE — Consult Note (Signed)
NAME:  Andrea Bush, MRN:  [id number], DOB:  [birth year], LOS: 0 ADMISSION DATE:  06/30/2019, CONSULTATION DATE:  06/30/2019 REFERRING MD:  Regenia Skeeter - EM, CHIEF COMPLAINT:  Submassive PE   Brief History   53 yo F presenting with SOB, suspected PE in Ed, started on heparin. CT angio chest confirmed massive bialteral PE   History of present illness   History obtained from chart review  53 yo F PMH Prior PE, HTN, HLD, DM, who presented to ED 06/30/2019 with complaint of SOB, at advice of internal medicine clinic when patient called 7/20 for advice. SOB began approx 0900 when patient was climbing flight of stairs and patient noticed she was more out of breath than usual. She endorsed associated dizziness. The shortness of breath improved with rest. The patient denied leg pain or swelling, but endorsed bruising of left medial thigh 1 month ago. Patient denies chest pain at present but endorsed a feeling of chest heaviness earlier in the day. Patient does have a history of PE, approximately 14 years ago, and does not take anticoagulants.   Past Medical History  PE HTN HLD Hypothyroidism DM  Significant Hospital Events   06/30/2019> presents to Ed with SOB. Concern for PE, started on heparin gtt. CT angio chest reveals massive bilateral pulmonary emboli with evidence of R heart strain. PCCm consulted, IR consulted  Consults:  PCCM IR   Procedures:    Significant Diagnostic Tests:  7/20 CT angio chest> massive bilateral pulmonary emboli with marked right heart strain.   Micro Data:  7/20 SARS CoV- 2>>>  Antimicrobials:    Interim history/subjective:  Started on heparin gtt with concern for PE in ED. Imaging confirms massive bilateral PE. Patient exhibiting some tachycardia but otherwise HDS and SpO2 95% on RA   Objective   Blood pressure 113/84, pulse (!) 125, temperature 97.7 F (36.5 C), temperature source Oral, resp. rate 16, height 5\' 10"  (1.778 m), weight 122.5 kg, SpO2 93 %.        No intake or output data in the 24 hours ending 06/30/19 1607 Filed Weights   06/30/19 1214  Weight: 122.5 kg      Resolved Hospital Problem list     Assessment & Plan:   Bilateral Pulmonary Emboli with evidence of R heart strain on CT angio -prior hx of PE, not on home anticoagulation P Continue heparin per pharmacy dosing PCCM MD has consulted IR  Supplemental O2 for SpO2 goal >92%. Currently adequate on RA   DM -metformin, jardiance at home  P SSI   HTN Lisinopril at home P Continue tele Patient at risk for developing shock, HDS at present will hold on resuming home lisinopril at this time   GERD -PRN pepcid at home P  QHS PRN pepcid      Labs   CBC: Recent Labs  Lab 06/30/19 1240  WBC 9.6  HGB 16.4*  HCT 49.2*  MCV 88.3  PLT 707    Basic Metabolic Panel: Recent Labs  Lab 06/30/19 1240  NA 136  K 4.4  CL 104  CO2 22  GLUCOSE 262*  BUN 12  CREATININE 0.86  CALCIUM 9.2   GFR: Estimated Creatinine Clearance: 105.1 mL/min (by C-G formula based on SCr of 0.86 mg/dL). Recent Labs  Lab 06/30/19 1240  WBC 9.6    Liver Function Tests: No results for input(s): AST, ALT, ALKPHOS, BILITOT, PROT, ALBUMIN in the last 168 hours. No results for input(s): LIPASE, AMYLASE in the last 168  hours. No results for input(s): AMMONIA in the last 168 hours.  ABG No results found for: PHART, PCO2ART, PO2ART, HCO3, TCO2, ACIDBASEDEF, O2SAT   Coagulation Profile: No results for input(s): INR, PROTIME in the last 168 hours.  Cardiac Enzymes: No results for input(s): CKTOTAL, CKMB, CKMBINDEX, TROPONINI in the last 168 hours.  HbA1C: Hgb A1c MFr Bld  Date/Time Value Ref Range Status  04/15/2019 01:05 PM 10.0 (H) <5.7 % of total Hgb Final    Comment:    For someone without known diabetes, a hemoglobin A1c value of 6.5% or greater indicates that they may have  diabetes and this should be confirmed with a follow-up  test. . For someone with  known diabetes, a value <7% indicates  that their diabetes is well controlled and a value  greater than or equal to 7% indicates suboptimal  control. A1c targets should be individualized based on  duration of diabetes, age, comorbid conditions, and  other considerations. . Currently, no consensus exists regarding use of hemoglobin A1c for diagnosis of diabetes for children. Marland Kitchen   12/25/2018 11:58 AM 10.5 (H) <5.7 % of total Hgb Final    Comment:    For someone without known diabetes, a hemoglobin A1c value of 6.5% or greater indicates that they may have  diabetes and this should be confirmed with a follow-up  test. . For someone with known diabetes, a value <7% indicates  that their diabetes is well controlled and a value  greater than or equal to 7% indicates suboptimal  control. A1c targets should be individualized based on  duration of diabetes, age, comorbid conditions, and  other considerations. . Currently, no consensus exists regarding use of hemoglobin A1c for diagnosis of diabetes for children. .     CBG: No results for input(s): GLUCAP in the last 168 hours.  Review of Systems:   Constitutional: negative for anorexia, fevers and sweats  Eyes: negative for irritation, redness and visual disturbance  Ears, nose, mouth, throat, and face: negative for earaches, epistaxis, nasal congestion and sore throat  Respiratory: negative for cough, sputum and wheezing  Cardiovascular: negative for  lower extremity edema, orthopnea, palpitations and syncope  Gastrointestinal: negative for abdominal pain, constipation, diarrhea, melena, nausea and vomiting  Genitourinary:negative for dysuria, frequency and hematuria  Hematologic/lymphatic: negative for bleeding, easy bruising and lymphadenopathy  Musculoskeletal:negative for arthralgias, muscle weakness and stiff joints  Neurological: negative for coordination problems, gait problems, headaches and weakness  Endocrine: negative  for diabetic symptoms including polydipsia, polyuria and weight loss  Positive for chest pain and dyspnea  Past Medical History  She,  has a past medical history of Fibrocystic breast, Hyperlipidemia, Hypertension, Hypothyroidism, Iron deficiency anemia, Obesity, Prediabetes, Pulmonary embolism ([place]) (2006), and Vitamin D deficiency.   Surgical History    Past Surgical History:  Procedure Laterality Date  . ABLATION  2006  . THYROID SURGERY     Nodule benign  . TONSILLECTOMY AND ADENOIDECTOMY    . TUBAL LIGATION Bilateral      Social History   reports that she has never smoked. She has never used smokeless tobacco. She reports that she does not drink alcohol or use drugs.   Family History   Her family history includes Cancer in her mother; Diabetes in her father; Osteoporosis in her mother.   Allergies Allergies  Allergen Reactions  . Farxiga [Dapagliflozin] Anaphylaxis  . Crestor [Rosuvastatin]     Hair Loss   . Penicillins Rash    Did it involve swelling  of the face/tongue/throat, SOB, or low BP? No Did it involve sudden or severe rash/hives, skin peeling, or any reaction on the inside of your mouth or nose? No Did you need to seek medical attention at a hospital or doctor's office? No When did it last happen? If all above answers are "NO", may proceed with cephalosporin use.     Home Medications  Prior to Admission medications   Medication Sig Start Date End Date Taking? Authorizing Provider  acetaminophen (TYLENOL) 500 MG tablet Take 1,000 mg by mouth every 6 (six) hours as needed for mild pain.   Yes [provider]  aspirin EC 81 MG tablet Take 81 mg by mouth daily.   Yes [provider]  cetirizine (ZYRTEC) 10 MG tablet Take 10 mg by mouth daily.   Yes [provider]  Cholecalciferol 5000 units capsule Take 5,000 Units by mouth daily.   Yes [provider]  empagliflozin (JARDIANCE) 25 MG TABS tablet Take 1 tablet Daily  for Diabetes Patient taking differently: Take 25 mg by mouth daily. Take 1 tablet Daily for Diabetes 06/11/19  Yes Vicie Mutters, PA-C  famotidine (PEPCID) 40 MG tablet Take 1 tablet (40 mg total) by mouth every evening. Patient taking differently: Take 40 mg by mouth daily as needed for heartburn or indigestion.  04/15/19 04/14/20 Yes Vicie Mutters, PA-C  lisinopril (ZESTRIL) 20 MG tablet TAKE 1 TABLET BY MOUTH EVERY DAY Patient taking differently: Take 20 mg by mouth daily.  06/25/19  Yes Vicie Mutters, PA-C  metFORMIN (GLUCOPHAGE-XR) 500 MG 24 hr tablet START BY TAKING 1 TABLET BY MOUTH WITH DINNER. Patient taking differently: Take 500 mg by mouth every evening. START BY TAKING 1 TABLET BY MOUTH WITH DINNER. 06/25/19  Yes Vicie Mutters, PA-C     Independently examined pt, evaluated data & formulated above care plan with NP   Presented with sudden onset shortness of breath and chest pain, CT angiogram showing bilateral large pulmonary emboli with complete occlusion of left main pulmonary artery, RV/LV ratio 2.3. She is hemodynamically stable and surprisingly on room air, has tachycardia 120s and elevated troponin. She has a history of pulmonary embolism, unprovoked 14 years ago.  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 Kathlene Cote from IR should she deteriorate over the next 24 hours, she may need catheter directed lysis-does not need it if she remains stable as she is now -She will need lifelong anticoagulation and follow-up in pulmonary clinic on discharge -Would suggest that we maintain therapeutic heparin levels for 48 hours before  switching to oral anticoagulation  Chareese Sergent V. Elsworth Soho MD [phone]

## 2019-06-30 NOTE — ED Notes (Signed)
Report called to [address].  Will transport pt after shift report/change for safety.  Pt aware of the delay and denies any needs at this time.

## 2019-06-30 NOTE — ED Notes (Signed)
Attempt to call report to floor.  Nurse will need to call back.

## 2019-07-01 ENCOUNTER — Observation Stay (HOSPITAL_COMMUNITY): Payer: No Typology Code available for payment source

## 2019-07-01 ENCOUNTER — Inpatient Hospital Stay (HOSPITAL_COMMUNITY): Payer: No Typology Code available for payment source

## 2019-07-01 DIAGNOSIS — R7989 Other specified abnormal findings of blood chemistry: Secondary | ICD-10-CM | POA: Diagnosis present

## 2019-07-01 DIAGNOSIS — E785 Hyperlipidemia, unspecified: Secondary | ICD-10-CM

## 2019-07-01 DIAGNOSIS — R079 Chest pain, unspecified: Secondary | ICD-10-CM | POA: Diagnosis present

## 2019-07-01 DIAGNOSIS — Z86711 Personal history of pulmonary embolism: Secondary | ICD-10-CM | POA: Diagnosis not present

## 2019-07-01 DIAGNOSIS — E1169 Type 2 diabetes mellitus with other specified complication: Secondary | ICD-10-CM | POA: Diagnosis present

## 2019-07-01 DIAGNOSIS — I361 Nonrheumatic tricuspid (valve) insufficiency: Secondary | ICD-10-CM

## 2019-07-01 DIAGNOSIS — Z880 Allergy status to penicillin: Secondary | ICD-10-CM | POA: Diagnosis not present

## 2019-07-01 DIAGNOSIS — E669 Obesity, unspecified: Secondary | ICD-10-CM | POA: Diagnosis not present

## 2019-07-01 DIAGNOSIS — I2699 Other pulmonary embolism without acute cor pulmonale: Secondary | ICD-10-CM

## 2019-07-01 DIAGNOSIS — Z833 Family history of diabetes mellitus: Secondary | ICD-10-CM | POA: Diagnosis not present

## 2019-07-01 DIAGNOSIS — Z888 Allergy status to other drugs, medicaments and biological substances status: Secondary | ICD-10-CM | POA: Diagnosis not present

## 2019-07-01 DIAGNOSIS — Z20828 Contact with and (suspected) exposure to other viral communicable diseases: Secondary | ICD-10-CM | POA: Diagnosis present

## 2019-07-01 DIAGNOSIS — Z7982 Long term (current) use of aspirin: Secondary | ICD-10-CM | POA: Diagnosis not present

## 2019-07-01 DIAGNOSIS — E1165 Type 2 diabetes mellitus with hyperglycemia: Secondary | ICD-10-CM | POA: Diagnosis present

## 2019-07-01 DIAGNOSIS — I2602 Saddle embolus of pulmonary artery with acute cor pulmonale: Secondary | ICD-10-CM | POA: Diagnosis not present

## 2019-07-01 DIAGNOSIS — Z6838 Body mass index (BMI) 38.0-38.9, adult: Secondary | ICD-10-CM | POA: Diagnosis not present

## 2019-07-01 DIAGNOSIS — Z7984 Long term (current) use of oral hypoglycemic drugs: Secondary | ICD-10-CM | POA: Diagnosis not present

## 2019-07-01 DIAGNOSIS — I10 Essential (primary) hypertension: Secondary | ICD-10-CM

## 2019-07-01 DIAGNOSIS — Z79899 Other long term (current) drug therapy: Secondary | ICD-10-CM | POA: Diagnosis not present

## 2019-07-01 DIAGNOSIS — K219 Gastro-esophageal reflux disease without esophagitis: Secondary | ICD-10-CM | POA: Diagnosis present

## 2019-07-01 DIAGNOSIS — E039 Hypothyroidism, unspecified: Secondary | ICD-10-CM | POA: Diagnosis present

## 2019-07-01 LAB — BASIC METABOLIC PANEL
Anion gap: 7 (ref 5–15)
BUN: 13 mg/dL (ref 6–20)
CO2: 22 mmol/L (ref 22–32)
Calcium: 8.8 mg/dL — ABNORMAL LOW (ref 8.9–10.3)
Chloride: 110 mmol/L (ref 98–111)
Creatinine, Ser: 0.82 mg/dL (ref 0.44–1.00)
GFR calc Af Amer: >60 mL/min (ref >60)
GFR calc non Af Amer: >60 mL/min (ref >60)
Glucose, Bld: 229 mg/dL — ABNORMAL HIGH (ref 70–99)
Potassium: 3.9 mmol/L (ref 3.5–5.1)
Sodium: 139 mmol/L (ref 135–145)

## 2019-07-01 LAB — HEPARIN LEVEL (UNFRACTIONATED): Heparin Unfractionated: 0.67 IU/mL (ref 0.30–0.70)

## 2019-07-01 LAB — HIV ANTIBODY (ROUTINE TESTING W REFLEX): HIV Screen 4th Generation wRfx: NONREACTIVE

## 2019-07-01 LAB — CBC
HCT: 43.4 % (ref 36.0–46.0)
Hemoglobin: 14.4 g/dL (ref 12.0–15.0)
MCH: 29.4 pg (ref 26.0–34.0)
MCHC: 33.2 g/dL (ref 30.0–36.0)
MCV: 88.8 fL (ref 80.0–100.0)
Platelets: 201 10*3/uL (ref 150–400)
RBC: 4.89 MIL/uL (ref 3.87–5.11)
RDW: 12.8 % (ref 11.5–15.5)
WBC: 9.5 10*3/uL (ref 4.0–10.5)
nRBC: 0 % (ref 0.0–0.2)

## 2019-07-01 LAB — ECHOCARDIOGRAM COMPLETE
Height: 70 in
Weight: 4270.4 oz

## 2019-07-01 LAB — GLUCOSE, CAPILLARY
Glucose-Capillary: 230 mg/dL — ABNORMAL HIGH (ref 70–99)
Glucose-Capillary: 260 mg/dL — ABNORMAL HIGH (ref 70–99)
Glucose-Capillary: 155 mg/dL — ABNORMAL HIGH (ref 70–99)

## 2019-07-01 MED ORDER — PERFLUTREN LIPID MICROSPHERE
1.0000 mL | INTRAVENOUS | Status: AC | PRN
  Administered 2019-07-01: 2 mL via INTRAVENOUS
  Filled 2019-07-01: qty 10

## 2019-07-01 NOTE — Progress Notes (Signed)
PROGRESS NOTE    Andrea Bush  IDP:[id number] DOB: 1966-01-16 DOA: 06/30/2019 PCP: Unk Pinto, MD    Brief Narrative:  53 year old female who presented with chest heaviness and dyspnea.  She does have significant past medical history for hypertension, hypothyroidism, type 2 diabetes mellitus, dyslipidemia and history of pulmonologist.  Experience chest pain while climbing a flight of stairs, associated with dyspnea dizziness.  On her initial physical examination blood pressure 125 to 135 bpm, blood pressure 111/72, respiratort rate 16, oxygen saturation 93%.  Her lungs were clear to auscultation bilaterally, heart S1-S2 present and rhythmic, the abdomen was soft nontender, no lower extremity edema.  Sodium 136, potassium 4.4, chloride 104, bicarb 22, glucose 262, BUN 12, creatinine 0.86, high sensitive troponin 270, white count 9.6, hemoglobin 6.4, hematocrit 49.2, platelets 223.  SARS COVID-19 negative.  Her chest radiograph was negative for infiltrates.  CT chest with massive bilateral pulmonary emboli with marked right heart strain.  EKG 839 bpm, normal axis, normal intervals, sinus rhythm with no significant ST segment or T wave changes  Patient was admitted to the hospital with a working diagnosis of acute submassive pulmonary embolism  Assessment & Plan:   Principal Problem:   Pulmonary embolus (HCC) Active Problems:   Hypertension   Type 2 diabetes mellitus with hyperlipidemia (HCC)   Obesity (BMI 30-39.9)   Elevated troponin   1. Acute submassive pulmonary embolism/ unprovoked . Patient oxygenating well at 96% on room air, continue to be tachycardic with HR above 100, blood pressure this am 88 to 443 mmHg systolic. Will continue close hemodynamic monitoring, continue heparin drip and follow with echocardiogram. Possible transition to oral anticoagulation in am, if patient continue hemodynamically stable.    2. HTN. Continue holding on antihypertensive agents for now, to  prevent worsening hypotension. At home on 10 mg of lisinopril.   3. T2DM. Will continue glucose cover and monitoring with insulins sliding scale, patient is tolerating po well, at home on metformin and empagliflozin, that currently on hold.   3. Obesity. Calculated BMI is 38,3.   DVT prophylaxis: IV heparin   Code Status:  full Family Communication: no family at the bedside  Disposition Plan/ discharge barriers: pending clinical  Improvement. Patient continue to be at high risk for decompensation, will change to inpatient status.   Body mass index is 38.3 kg/m. Malnutrition Type:      Malnutrition Characteristics:      Nutrition Interventions:     RN Pressure Injury Documentation:     Consultants:   Pulmonary   Procedures:     Antimicrobials:       Subjective: Patient feeling better but not yet back to baseline, continue to have dyspnea on exertion, no chest pain, no nausea or vomiting.   Objective: Vitals:   07/01/19 0020 07/01/19 0609 07/01/19 0728 07/01/19 0731  BP: (!) 88/63 107/75  108/73  Pulse: (!) 107 100  (!) 102  Resp: 15 19  18   Temp: 97.7 F (36.5 C) 97.7 F (36.5 C) 97.6 F (36.4 C) 97.6 F (36.4 C)  TempSrc: Oral Oral Oral Oral  SpO2: 97% 96%  95%  Weight:      Height:        Intake/Output Summary (Last 24 hours) at 07/01/2019 1049 Last data filed at 07/01/2019 0938 Gross per 24 hour  Intake 1651.25 ml  Output --  Net 1651.25 ml   Filed Weights   06/30/19 1214 06/30/19 2019  Weight: 122.5 kg 121.1 kg  Examination:   General: deconditioned  Neurology: Awake and alert, non focal  E ENT: mild pallor, no icterus, oral mucosa moist Cardiovascular: No JVD. S1-S2 present, rhythmic, no gallops, rubs, or murmurs. No lower extremity edema. Pulmonary: positive breath sounds bilaterally, adequate air movement, no wheezing, rhonchi or rales. Gastrointestinal. Abdomen with, no organomegaly, non tender, no rebound or guarding Skin. No  rashes Musculoskeletal: no joint deformities     Data Reviewed: I have personally reviewed following labs and imaging studies  CBC: Recent Labs  Lab 06/30/19 1240 07/01/19 0310  WBC 9.6 9.5  HGB 16.4* 14.4  HCT 49.2* 43.4  MCV 88.3 88.8  PLT 223 161   Basic Metabolic Panel: Recent Labs  Lab 06/30/19 1240 07/01/19 0310  NA 136 139  K 4.4 3.9  CL 104 110  CO2 22 22  GLUCOSE 262* 229*  BUN 12 13  CREATININE 0.86 0.82  CALCIUM 9.2 8.8*   GFR: Estimated Creatinine Clearance: 109.5 mL/min (by C-G formula based on SCr of 0.82 mg/dL). Liver Function Tests: No results for input(s): AST, ALT, ALKPHOS, BILITOT, PROT, ALBUMIN in the last 168 hours. No results for input(s): LIPASE, AMYLASE in the last 168 hours. No results for input(s): AMMONIA in the last 168 hours. Coagulation Profile: No results for input(s): INR, PROTIME in the last 168 hours. Cardiac Enzymes: No results for input(s): CKTOTAL, CKMB, CKMBINDEX, TROPONINI in the last 168 hours. BNP (last 3 results) No results for input(s): PROBNP in the last 8760 hours. HbA1C: No results for input(s): HGBA1C in the last 72 hours. CBG: Recent Labs  Lab 06/30/19 2154 07/01/19 0642  GLUCAP 257* 230*   Lipid Profile: No results for input(s): CHOL, HDL, LDLCALC, TRIG, CHOLHDL, LDLDIRECT in the last 72 hours. Thyroid Function Tests: Recent Labs    06/30/19 1634  TSH 1.749   Anemia Panel: No results for input(s): VITAMINB12, FOLATE, FERRITIN, TIBC, IRON, RETICCTPCT in the last 72 hours.    Radiology Studies: I have reviewed all of the imaging during this hospital visit personally     Scheduled Meds:  insulin aspart  0-15 Units Subcutaneous TID WC   insulin aspart  0-5 Units Subcutaneous QHS   lisinopril  20 mg Oral Daily   Continuous Infusions:  heparin 1,550 Units/hr (06/30/19 2316)     LOS: 0 days        Fitz Matsuo Gerome Apley, MD

## 2019-07-01 NOTE — Progress Notes (Signed)
NAME:  Andrea Bush, MRN:  [id number], DOB:  [birth year], LOS: 0 ADMISSION DATE:  06/30/2019, CONSULTATION DATE:  06/30/2019 REFERRING MD:  Regenia Skeeter - EM, CHIEF COMPLAINT:  Submassive PE   Brief History   53 yo F presenting with SOB, suspected PE in Ed, started on heparin. CT angio chest confirmed massive bialteral PE    Past Medical History  PE, HTN, HLD, Hypothyroidism, DM  Significant Hospital Events   06/30/2019> presents to Ed with SOB. Concern for PE, started on heparin gtt. CT angio chest reveals massive bilateral pulmonary emboli with evidence of R heart strain. PCCm consulted, IR consulted  Consults:  PCCM IR   Procedures:    Significant Diagnostic Tests:  7/20 CT angio chest> massive bilateral pulmonary emboli with marked right heart strain.   Micro Data:  7/20 SARS CoV- 2>>>  Antimicrobials:    Interim history/subjective:  Feeling much better here today. SOB greatly improved. Satting well on RA. Ambulating around the room without dizziness.   Objective   Blood pressure 108/73, pulse (!) 102, temperature 97.6 F (36.4 C), temperature source Oral, resp. rate 18, height 5\' 10"  (1.778 m), weight 121.1 kg, SpO2 95 %.        Intake/Output Summary (Last 24 hours) at 07/01/2019 1020 Last data filed at 07/01/2019 3382 Gross per 24 hour  Intake 1651.25 ml  Output -  Net 1651.25 ml   Filed Weights   06/30/19 1214 06/30/19 2019  Weight: 122.5 kg 121.1 kg      Resolved Hospital Problem list     Assessment & Plan:   Bilateral Pulmonary Emboli with evidence of R heart strain on CT angio. She has prior hx of PE, and is not on home anticoagulation. - likely no role for cdTAP at this point as she has significantly improved - Follow for echo results to ensure no significant RV strain. Venous doppler pending.  - Favor transition to DOAC after 48 hours therapeutic heparin levels as dosed per pharmacy - Likely will require lifelong anticoagulation considering this  is her second PE, and appears to be unprovoked.   DM -metformin, jardiance at home  P Per primary  HTN Lisinopril at home P Continue tele Resume Anti-HTNsives per primary   Labs   CBC: Recent Labs  Lab 06/30/19 1240 07/01/19 0310  WBC 9.6 9.5  HGB 16.4* 14.4  HCT 49.2* 43.4  MCV 88.3 88.8  PLT 223 505    Basic Metabolic Panel: Recent Labs  Lab 06/30/19 1240 07/01/19 0310  NA 136 139  K 4.4 3.9  CL 104 110  CO2 22 22  GLUCOSE 262* 229*  BUN 12 13  CREATININE 0.86 0.82  CALCIUM 9.2 8.8*   GFR: Estimated Creatinine Clearance: 109.5 mL/min (by C-G formula based on SCr of 0.82 mg/dL). Recent Labs  Lab 06/30/19 1240 07/01/19 0310  WBC 9.6 9.5    Liver Function Tests: No results for input(s): AST, ALT, ALKPHOS, BILITOT, PROT, ALBUMIN in the last 168 hours. No results for input(s): LIPASE, AMYLASE in the last 168 hours. No results for input(s): AMMONIA in the last 168 hours.  ABG No results found for: PHART, PCO2ART, PO2ART, HCO3, TCO2, ACIDBASEDEF, O2SAT   Coagulation Profile: No results for input(s): INR, PROTIME in the last 168 hours.  Cardiac Enzymes: No results for input(s): CKTOTAL, CKMB, CKMBINDEX, TROPONINI in the last 168 hours.  HbA1C: Hgb A1c MFr Bld  Date/Time Value Ref Range Status  04/15/2019 01:05 PM 10.0 (H) <5.7 % of total  Hgb Final    Comment:    For someone without known diabetes, a hemoglobin A1c value of 6.5% or greater indicates that they may have  diabetes and this should be confirmed with a follow-up  test. . For someone with known diabetes, a value <7% indicates  that their diabetes is well controlled and a value  greater than or equal to 7% indicates suboptimal  control. A1c targets should be individualized based on  duration of diabetes, age, comorbid conditions, and  other considerations. . Currently, no consensus exists regarding use of hemoglobin A1c for diagnosis of diabetes for children. Marland Kitchen   12/25/2018 11:58  AM 10.5 (H) <5.7 % of total Hgb Final    Comment:    For someone without known diabetes, a hemoglobin A1c value of 6.5% or greater indicates that they may have  diabetes and this should be confirmed with a follow-up  test. . For someone with known diabetes, a value <7% indicates  that their diabetes is well controlled and a value  greater than or equal to 7% indicates suboptimal  control. A1c targets should be individualized based on  duration of diabetes, age, comorbid conditions, and  other considerations. . Currently, no consensus exists regarding use of hemoglobin A1c for diagnosis of diabetes for children. .     CBG: Recent Labs  Lab 06/30/19 2154 07/01/19 0642  GLUCAP 257* 230*     Past Medical History  She,  has a past medical history of Fibrocystic breast, Hyperlipidemia, Hypertension, Hypothyroidism, Iron deficiency anemia, Obesity, Prediabetes, Pulmonary embolism ([place]) (2006), and Vitamin D deficiency.   Surgical History    Past Surgical History:  Procedure Laterality Date  . ABLATION  2006  . THYROID SURGERY     Nodule benign  . TONSILLECTOMY AND ADENOIDECTOMY    . TUBAL LIGATION Bilateral      Social History   reports that she has never smoked. She has never used smokeless tobacco. She reports that she does not drink alcohol or use drugs.   Family History   Her family history includes Cancer in her mother; Diabetes in her father; Osteoporosis in her mother.   Allergies Allergies  Allergen Reactions  . Farxiga [Dapagliflozin] Anaphylaxis  . Crestor [Rosuvastatin]     Hair Loss   . Penicillins Rash    Did it involve swelling of the face/tongue/throat, SOB, or low BP? No Did it involve sudden or severe rash/hives, skin peeling, or any reaction on the inside of your mouth or nose? No Did you need to seek medical attention at a hospital or doctor's office? No When did it last happen? If all above answers are "NO", may proceed with  cephalosporin use.     Home Medications  Prior to Admission medications   Medication Sig Start Date End Date Taking? Authorizing Provider  acetaminophen (TYLENOL) 500 MG tablet Take 1,000 mg by mouth every 6 (six) hours as needed for mild pain.   Yes [provider]  aspirin EC 81 MG tablet Take 81 mg by mouth daily.   Yes [provider]  cetirizine (ZYRTEC) 10 MG tablet Take 10 mg by mouth daily.   Yes [provider]  Cholecalciferol 5000 units capsule Take 5,000 Units by mouth daily.   Yes [provider]  empagliflozin (JARDIANCE) 25 MG TABS tablet Take 1 tablet Daily for Diabetes Patient taking differently: Take 25 mg by mouth daily. Take 1 tablet Daily for Diabetes 06/11/19  Yes Vicie Mutters, PA-C  famotidine (PEPCID)  40 MG tablet Take 1 tablet (40 mg total) by mouth every evening. Patient taking differently: Take 40 mg by mouth daily as needed for heartburn or indigestion.  04/15/19 04/14/20 Yes Vicie Mutters, PA-C  lisinopril (ZESTRIL) 20 MG tablet TAKE 1 TABLET BY MOUTH EVERY DAY Patient taking differently: Take 20 mg by mouth daily.  06/25/19  Yes Vicie Mutters, PA-C  metFORMIN (GLUCOPHAGE-XR) 500 MG 24 hr tablet START BY TAKING 1 TABLET BY MOUTH WITH DINNER. Patient taking differently: Take 500 mg by mouth every evening. START BY TAKING 1 TABLET BY MOUTH WITH DINNER. 06/25/19  Yes Vicie Mutters, PA-C     Georgann Housekeeper, AGACNP-BC [place] Pager [phone] or [phone]  07/01/2019 10:24 AM

## 2019-07-01 NOTE — Progress Notes (Signed)
  Echocardiogram 2D Echocardiogram has been performed.  Burnett Kanaris 07/01/2019, 8:42 AM

## 2019-07-01 NOTE — Progress Notes (Signed)
ANTICOAGULATION CONSULT NOTE - Follow Up Consult  Pharmacy Consult for Heparin Indication: pulmonary embolus  Allergies  Allergen Reactions  . Farxiga [Dapagliflozin] Anaphylaxis  . Crestor [Rosuvastatin]     Hair Loss   . Penicillins Rash    Did it involve swelling of the face/tongue/throat, SOB, or low BP? No Did it involve sudden or severe rash/hives, skin peeling, or any reaction on the inside of your mouth or nose? No Did you need to seek medical attention at a hospital or doctor's office? No When did it last happen? If all above answers are "NO", may proceed with cephalosporin use.    Patient Measurements: Height: 5\' 10"  (177.8 cm) Weight: 266 lb 14.4 oz (121.1 kg) IBW/kg (Calculated) : 68.5 Heparin Dosing Weight:  96.3 kg  Vital Signs: Temp: 97.6 F (36.4 C) (07/21 0731) Temp Source: Oral (07/21 0731) BP: 108/73 (07/21 0731) Pulse Rate: 102 (07/21 0731)  Labs: Recent Labs    06/30/19 1240 06/30/19 2046 06/30/19 2104 07/01/19 0310  HGB 16.4*  --   --  14.4  HCT 49.2*  --   --  43.4  PLT 223  --   --  201  HEPARINUNFRC  --   --  0.63 0.67  CREATININE 0.86  --   --  0.82  TROPONINIHS 270* 384*  --   --     Estimated Creatinine Clearance: 109.5 mL/min (by C-G formula based on SCr of 0.82 mg/dL).  Assessment: Anticoag: heparin gtt for PE (note h/o PE not on anticoag PTA)  HL 0.67. CBC WNL - 7/20: CT: Massive bilateral pulmonary emboli with marked right heart strain  Goal of Therapy:  Heparin level 0.3-0.7 units/ml Monitor platelets by anticoagulation protocol: Yes   Plan:  Heparin 1550 units/hr Daily HL and CBC F/u transition to [facility] F/u 2D Echo   Mariyam Remington S. Alford Highland, PharmD, BCPS Clinical Staff Pharmacist Eilene Ghazi Stillinger 07/01/2019,9:05 AM

## 2019-07-01 NOTE — Progress Notes (Signed)
Inpatient Diabetes Program Recommendations  AACE/ADA: New Consensus Statement on Inpatient Glycemic Control  Target Ranges:  Prepandial:   less than 140 mg/dL      Peak postprandial:   less than 180 mg/dL (1-2 hours)      Critically ill patients:  140 - 180 mg/dL  Results for Andrea Bush, Andrea Bush (MRN [id number]) as of 07/01/2019 13:29  Ref. Range 06/30/2019 21:54 07/01/2019 06:42 07/01/2019 11:14  Glucose-Capillary Latest Ref Range: 70 - 99 mg/dL 257 (H)  Novolog 3 units 230 (H)  Novolog 5 units 260 (H)  Novolog 8 units   Results for Andrea Bush, Andrea Bush (MRN [id number]) as of 07/01/2019 13:29  Ref. Range 12/25/2018 11:58 04/15/2019 13:05  Hemoglobin A1C Latest Ref Range: <5.7 % of total Hgb 10.5 (H) 10.0 (H)   Review of Glycemic Control  Diabetes history: DM2 Outpatient Diabetes medications: Jardiance 25 mg daily, Metformin XR 500 mg QPM Current orders for Inpatient glycemic control: Novolog 0-15 units TID with meals  Inpatient Diabetes Program Recommendations:   Insulin-Basal: Please consider ordering Lantus 10 units Q24H.  Insulin-Correction: Please consider ordering Novolog 0-5 units QHS for bedtime correction.  Thanks, Barnie Alderman, RN, MSN, CDE Diabetes Coordinator Inpatient Diabetes Program [phone] (Team Pager from 8am to 5pm)

## 2019-07-01 NOTE — Progress Notes (Signed)
Vascular US completed, patient has a clot in her left femoral vein per ultrasound staff, Arrien MD paged, will continue to monitor.

## 2019-07-01 NOTE — Progress Notes (Signed)
Bilateral lower extremity venous duplex has been completed. Preliminary results can be found in CV Proc through chart review.  Results were given to the patient's nurse, Fola.  07/01/19 2:55 PM Carlos Levering RVT

## 2019-07-01 NOTE — Progress Notes (Signed)
PCCM INTERVAL PROGRESS NOTE   Echocardiogram reviewed:  The left ventricle has hyperdynamic systolic function, with an ejection fraction of > 65%.  The right ventricle has normal systolic function. The cavity was moderately enlarged. There is no increase in right ventricular wall thickness. Right ventricular systolic pressure is mildly elevated with an estimated pressure of 38.7 mmHg.  Plan: No role for IR/EKOS Recommend transition to [facility] today  PCCM will sign off   Georgann Housekeeper, AGACNP-BC [place] Pager [phone] or [phone]  07/01/2019 11:26 AM

## 2019-07-02 LAB — BASIC METABOLIC PANEL
Anion gap: 7 (ref 5–15)
BUN: 16 mg/dL (ref 6–20)
CO2: 23 mmol/L (ref 22–32)
Calcium: 8.8 mg/dL — ABNORMAL LOW (ref 8.9–10.3)
Chloride: 109 mmol/L (ref 98–111)
Creatinine, Ser: 0.95 mg/dL (ref 0.44–1.00)
GFR calc Af Amer: >60 mL/min (ref >60)
GFR calc non Af Amer: >60 mL/min (ref >60)
Glucose, Bld: 235 mg/dL — ABNORMAL HIGH (ref 70–99)
Potassium: 4.2 mmol/L (ref 3.5–5.1)
Sodium: 139 mmol/L (ref 135–145)

## 2019-07-02 LAB — GLUCOSE, CAPILLARY
Glucose-Capillary: 251 mg/dL — ABNORMAL HIGH (ref 70–99)
Glucose-Capillary: 224 mg/dL — ABNORMAL HIGH (ref 70–99)
Glucose-Capillary: 202 mg/dL — ABNORMAL HIGH (ref 70–99)

## 2019-07-02 LAB — CBC
HCT: 42.1 % (ref 36.0–46.0)
Hemoglobin: 14.1 g/dL (ref 12.0–15.0)
MCH: 29.7 pg (ref 26.0–34.0)
MCHC: 33.5 g/dL (ref 30.0–36.0)
MCV: 88.8 fL (ref 80.0–100.0)
Platelets: 180 10*3/uL (ref 150–400)
RBC: 4.74 MIL/uL (ref 3.87–5.11)
RDW: 12.7 % (ref 11.5–15.5)
WBC: 8.3 10*3/uL (ref 4.0–10.5)
nRBC: 0 % (ref 0.0–0.2)

## 2019-07-02 LAB — HEPARIN LEVEL (UNFRACTIONATED): Heparin Unfractionated: 0.64 IU/mL (ref 0.30–0.70)

## 2019-07-02 MED ORDER — APIXABAN 5 MG PO TABS: 5.0000 mg | ORAL_TABLET | Freq: Two times a day (BID) | ORAL | Status: DC

## 2019-07-02 MED ORDER — ELIQUIS 5 MG VTE STARTER PACK: ORAL_TABLET | ORAL | 0 refills | Status: DC

## 2019-07-02 MED ORDER — APIXABAN 5 MG PO TABS
10.0000 mg | ORAL_TABLET | Freq: Two times a day (BID) | ORAL | Status: DC
  Administered 2019-07-02: 08:00:00 10 mg via ORAL
  Filled 2019-07-02: qty 2

## 2019-07-02 NOTE — Discharge Summary (Signed)
Physician Discharge Summary  Andrea Bush MGQ:[id number] DOB: 1966-04-01 DOA: 06/30/2019  PCP: Andrea Pinto, MD  Admit date: 06/30/2019 Discharge date: 07/02/2019  Admitted From: Home Disposition: Home  Recommendations for Outpatient Follow-up:  1. Follow up with PCP in 1-2 weeks 2. Please obtain BMP/CBC in one week 3. Please follow up on the following pending results:  Home Health: None Equipment/Devices: None  Discharge Condition: Stable CODE STATUS: Full code  Diet recommendation: Regular  Subjective: Patient seen and examined few hours ago.  She stated that she is feeling much better.  She had no shortness of breath or any other complaint.  She wanted to go home.  Brief/Interim Summary: 53 year old female who presented with chest heaviness and dyspnea.  She does have significant past medical history for hypertension, hypothyroidism, type 2 diabetes mellitus, dyslipidemia and history of pulmonologist.  Experience chest pain while climbing a flight of stairs, associated with dyspnea dizziness.  On her initial physical examination blood pressure 125 to 135 bpm, blood pressure 111/72, respiratort rate 16, oxygen saturation 93%.  Her lungs were clear to auscultation bilaterally, heart S1-S2 present and rhythmic, the abdomen was soft nontender, no lower extremity edema.  Sodium 136, potassium 4.4, chloride 104, bicarb 22, glucose 262, BUN 12, creatinine 0.86, high sensitive troponin 270, white count 9.6, hemoglobin 6.4, hematocrit 49.2, platelets 223.  SARS COVID-19 negative.  Her chest radiograph was negative for infiltrates.  CT chest with massive bilateral pulmonary emboli with marked right heart strain.  EKG 839 bpm, normal axis, normal intervals, sinus rhythm with no significant ST segment or T wave changes  Patient was admitted to the hospital with a working diagnosis of acute submassive pulmonary embolism.  She was started on heparin drip.  PCCM was consulted for consideration of  EKOS however her transthoracic core did not show significant heart strain so this was deferred and pulmonary critical care signed off.  They recommended starting the patient on oral anticoagulant on 07/01/2019 however patient made on heparin up until this morning when I saw her.  Since she had no complaints so heparin was stopped and she was switched to Eliquis.  Case manager/social worker were consulted and they had determined that patient's insurance will cover anticoagulant so patient is going to be discharged on Eliquis starter pack.  She will follow-up with PCP within 1 week.  Discharge Diagnoses:  Principal Problem:   Pulmonary embolus ([place]) Active Problems:   Hypertension   Type 2 diabetes mellitus with hyperlipidemia (HCC)   Obesity (BMI 30-39.9)   Elevated troponin   Subacute massive pulmonary embolism [facility]    Discharge Instructions  Discharge Instructions    Discharge patient   Complete by: As directed    Discharge disposition: 01-Home or Self Care   Discharge patient date: 07/02/2019     Allergies as of 07/02/2019      Reactions   Farxiga [dapagliflozin] Anaphylaxis   Crestor [rosuvastatin]    Hair Loss   Penicillins Rash   Did it involve swelling of the face/tongue/throat, SOB, or low BP? No Did it involve sudden or severe rash/hives, skin peeling, or any reaction on the inside of your mouth or nose? No Did you need to seek medical attention at a hospital or doctor's office? No When did it last happen? If all above answers are "NO", may proceed with cephalosporin use.      Medication List    TAKE these medications   acetaminophen 500 MG tablet Commonly known as: TYLENOL Take 1,000 mg  by mouth every 6 (six) hours as needed for mild pain.   aspirin EC 81 MG tablet Take 81 mg by mouth daily.   cetirizine 10 MG tablet Commonly known as: ZYRTEC Take 10 mg by mouth daily.   Cholecalciferol 125 MCG (5000 UT) capsule Take 5,000 Units by mouth daily.    Eliquis DVT/PE Starter Pack 5 MG Tabs Take as directed on package: start with two-5mg  tablets twice daily for 7 days. On day 8, switch to one-5mg  tablet twice daily.   famotidine 40 MG tablet Commonly known as: PEPCID Take 1 tablet (40 mg total) by mouth every evening. What changed:   when to take this  reasons to take this   Jardiance 25 MG Tabs tablet Generic drug: empagliflozin Take 1 tablet Daily for Diabetes What changed:   how much to take  how to take this  when to take this   lisinopril 20 MG tablet Commonly known as: ZESTRIL TAKE 1 TABLET BY MOUTH EVERY DAY   metFORMIN 500 MG 24 hr tablet Commonly known as: GLUCOPHAGE-XR START BY TAKING 1 TABLET BY MOUTH WITH DINNER. What changed:   how much to take  how to take this  when to take this      Follow-up Information    Andrea Pinto, MD Follow up in 1 week(s).   Specialty: Internal Medicine Contact information: [address] [place] [address] [phone]          Allergies  Allergen Reactions  . Farxiga [Dapagliflozin] Anaphylaxis  . Crestor [Rosuvastatin]     Hair Loss   . Penicillins Rash    Did it involve swelling of the face/tongue/throat, SOB, or low BP? No Did it involve sudden or severe rash/hives, skin peeling, or any reaction on the inside of your mouth or nose? No Did you need to seek medical attention at a hospital or doctor's office? No When did it last happen? If all above answers are "NO", may proceed with cephalosporin use.    Consultations: PCCM   Procedures/Studies: Dg Chest 2 View  Result Date: 06/30/2019 CLINICAL DATA:  Chest pain.  History of pulmonary embolism. EXAM: CHEST - 2 VIEW COMPARISON:  01/21/2005 CT chest report.  No prior chest radiograph. FINDINGS: Midline trachea.  Normal heart size and mediastinal contours. Sharp costophrenic angles.  No pneumothorax.  Clear lungs. Mild convex right thoracic spine curvature. IMPRESSION: No  active cardiopulmonary disease. Electronically Signed   By: Abigail Miyamoto M.D.   On: 06/30/2019 12:44   Ct Angio Chest Pe W And/or Wo Contrast  Result Date: 06/30/2019 CLINICAL DATA:  Shortness of breath today. History of DVT and pulmonary embolism. EXAM: CT ANGIOGRAPHY CHEST WITH CONTRAST TECHNIQUE: Multidetector CT imaging of the chest was performed using the standard protocol during bolus administration of intravenous contrast. Multiplanar CT image reconstructions and MIPs were obtained to evaluate the vascular anatomy. CONTRAST:  136mL OMNIPAQUE IOHEXOL 350 MG/ML SOLN COMPARISON:  Chest radiographs obtained earlier today. Chest CTA report dated 01/21/2005. FINDINGS: Cardiovascular: Multiple large and moderate-sized bilateral pulmonary arterial filling defects, including large defects in both main pulmonary arteries, with almost complete occlusion of the left main pulmonary artery. Associated enlargement of the right ventricle and right atrium a right ventricular to left ventricular ratio of 2.3. Mediastinum/Nodes: No enlarged mediastinal, hilar, or axillary lymph nodes. Thyroid gland, trachea, and esophagus demonstrate no significant findings. Lungs/Pleura: Lungs are clear. No pleural effusion or pneumothorax. Upper Abdomen: Refluxed contrast into the inferior vena cava and hepatic  veins. Musculoskeletal: Mild thoracic spine degenerative changes. Review of the MIP images confirms the above findings. IMPRESSION: Massive bilateral pulmonary emboli with marked right heart strain with a right ventricular to left ventricular ratio of 2.3. There is associated reflux of contrast into the inferior vena cava and hepatic veins. The presence of right heart strain has been associated with an increased risk of morbidity and mortality. Please activate Code PE by paging [phone]. Critical Value/emergent results were called by telephone at the time of interpretation on 06/30/2019 at 3:47 pm to Dr. Melina Copa, who verbally  acknowledged these results. Electronically Signed   By: Claudie Revering M.D.   On: 06/30/2019 15:51   Vas [place] Lower Extremity Venous (dvt)  Result Date: 07/01/2019  Lower Venous Study Indications: Pulmonary embolism.  Risk Factors: None identified. Limitations: Body habitus and poor ultrasound/tissue interface. Comparison Study: No prior studies. Performing Technologist: Oliver Hum RVT  Examination Guidelines: A complete evaluation includes B-mode imaging, spectral Doppler, color Doppler, and power Doppler as needed of all accessible portions of each vessel. Bilateral testing is considered an integral part of a complete examination. Limited examinations for reoccurring indications may be performed as noted.  +---------+---------------+---------+-----------+----------+-------+ RIGHT    CompressibilityPhasicitySpontaneityPropertiesSummary +---------+---------------+---------+-----------+----------+-------+ CFV      Full           Yes      Yes                          +---------+---------------+---------+-----------+----------+-------+ SFJ      Full                                                 +---------+---------------+---------+-----------+----------+-------+ FV Prox  Full                                                 +---------+---------------+---------+-----------+----------+-------+ FV Mid   Full                                                 +---------+---------------+---------+-----------+----------+-------+ FV DistalFull                                                 +---------+---------------+---------+-----------+----------+-------+ PFV      Full                                                 +---------+---------------+---------+-----------+----------+-------+ POP      Full           Yes      Yes                          +---------+---------------+---------+-----------+----------+-------+ PTV      Full                                                  +---------+---------------+---------+-----------+----------+-------+  PERO     Full                                                 +---------+---------------+---------+-----------+----------+-------+   +---------+---------------+---------+-----------+----------+-------+ LEFT     CompressibilityPhasicitySpontaneityPropertiesSummary +---------+---------------+---------+-----------+----------+-------+ CFV      Partial        Yes      Yes                  Acute   +---------+---------------+---------+-----------+----------+-------+ SFJ      Full                                                 +---------+---------------+---------+-----------+----------+-------+ FV Prox  Full                                                 +---------+---------------+---------+-----------+----------+-------+ FV Mid   Full                                                 +---------+---------------+---------+-----------+----------+-------+ FV DistalFull                                                 +---------+---------------+---------+-----------+----------+-------+ PFV      Full                                                 +---------+---------------+---------+-----------+----------+-------+ POP      Full           Yes      Yes                          +---------+---------------+---------+-----------+----------+-------+ PTV      Full                                                 +---------+---------------+---------+-----------+----------+-------+ PERO     Full                                                 +---------+---------------+---------+-----------+----------+-------+ EIV      Full           Yes      Yes                          +---------+---------------+---------+-----------+----------+-------+ CIV  Yes      Yes                          +---------+---------------+---------+-----------+----------+-------+  The external, and common iliac veins appear patent.    Summary: Right: There is no evidence of deep vein thrombosis in the lower extremity. Left: Findings consistent with acute deep vein thrombosis involving the left common femoral vein. No cystic structure found in the popliteal fossa. The external, and common iliac veins appear patent.  *See table(s) above for measurements and observations. Electronically signed by Deitra Mayo MD on 07/01/2019 at 6:00:43 PM.    Final       Discharge Exam: Vitals:   07/02/19 0816 07/02/19 1258  BP: 130/86 (!) 136/95  Pulse:  94  Resp: 14 19  Temp:  97.6 F (36.4 C)  SpO2: 95% 94%   Vitals:   07/01/19 2341 07/02/19 0535 07/02/19 0816 07/02/19 1258  BP: 127/83 119/79 130/86 (!) 136/95  Pulse: (!) 101 97  94  Resp: 19 15 14 19   Temp: 98 F (36.7 C) 98.1 F (36.7 C)  97.6 F (36.4 C)  TempSrc: Oral Oral  Oral  SpO2: 94% 91% 95% 94%  Weight:      Height:        General: Pt is alert, awake, not in acute distress Cardiovascular: RRR, S1/S2 +, no rubs, no gallops Respiratory: CTA bilaterally, no wheezing, no rhonchi Abdominal: Soft, NT, ND, bowel sounds + Extremities: no edema, no cyanosis    The results of significant diagnostics from this hospitalization (including imaging, microbiology, ancillary and laboratory) are listed below for reference.     Microbiology: Recent Results (from the past 240 hour(s))  SARS Coronavirus 2 (CEPHEID - Performed in [place] hospital lab), Hosp Order     Status: None   Collection Time: 06/30/19  3:42 PM   Specimen: Nasopharyngeal Swab  Result Value Ref Range Status   SARS Coronavirus 2 NEGATIVE NEGATIVE Final    Comment: (NOTE) If result is NEGATIVE SARS-CoV-2 target nucleic acids are NOT DETECTED. The SARS-CoV-2 RNA is generally detectable in upper and lower  respiratory specimens during the acute phase of infection. The lowest  concentration of SARS-CoV-2 viral copies this assay can detect is  250  copies / mL. A negative result does not preclude SARS-CoV-2 infection  and should not be used as the sole basis for treatment or other  patient management decisions.  A negative result may occur with  improper specimen collection / handling, submission of specimen other  than nasopharyngeal swab, presence of viral mutation(s) within the  areas targeted by this assay, and inadequate number of viral copies  (<250 copies / mL). A negative result must be combined with clinical  observations, patient history, and epidemiological information. If result is POSITIVE SARS-CoV-2 target nucleic acids are DETECTED. The SARS-CoV-2 RNA is generally detectable in upper and lower  respiratory specimens dur ing the acute phase of infection.  Positive  results are indicative of active infection with SARS-CoV-2.  Clinical  correlation with patient history and other diagnostic information is  necessary to determine patient infection status.  Positive results do  not rule out bacterial infection or co-infection with other viruses. If result is PRESUMPTIVE POSTIVE SARS-CoV-2 nucleic acids MAY BE PRESENT.   A presumptive positive result was obtained on the submitted specimen  and confirmed on repeat testing.  While 2019 novel coronavirus  (SARS-CoV-2) nucleic acids may be present in the  submitted sample  additional confirmatory testing may be necessary for epidemiological  and / or clinical management purposes  to differentiate between  SARS-CoV-2 and other Sarbecovirus currently known to infect humans.  If clinically indicated additional testing with an alternate test  methodology [phone]) is advised. The SARS-CoV-2 RNA is generally  detectable in upper and lower respiratory sp ecimens during the acute  phase of infection. The expected result is Negative. Fact Sheet for Patients:  StrictlyIdeas.no Fact Sheet for Healthcare  Providers: BankingDealers.co.za This test is not yet approved or cleared by the [place] FDA and has been authorized for detection and/or diagnosis of SARS-CoV-2 by FDA under an Emergency Use Authorization (EUA).  This EUA will remain in effect (meaning this test can be used) for the duration of the COVID-19 declaration under Section 564(b)(1) of the Act, 21 U.S.C. section 360bbb-3(b)(1), unless the authorization is terminated or revoked sooner. Performed at [facility], [place] [address], [place], [place] [address]      Labs: BNP (last 3 results) No results for input(s): BNP in the last 8760 hours. Basic Metabolic Panel: Recent Labs  Lab 06/30/19 1240 07/01/19 0310 07/02/19 0301  NA 136 139 139  K 4.4 3.9 4.2  CL 104 110 109  CO2 22 22 23   GLUCOSE 262* 229* 235*  BUN 12 13 16   CREATININE 0.86 0.82 0.95  CALCIUM 9.2 8.8* 8.8*   Liver Function Tests: No results for input(s): AST, ALT, ALKPHOS, BILITOT, PROT, ALBUMIN in the last 168 hours. No results for input(s): LIPASE, AMYLASE in the last 168 hours. No results for input(s): AMMONIA in the last 168 hours. CBC: Recent Labs  Lab 06/30/19 1240 07/01/19 0310 07/02/19 0301  WBC 9.6 9.5 8.3  HGB 16.4* 14.4 14.1  HCT 49.2* 43.4 42.1  MCV 88.3 88.8 88.8  PLT 223 201 180   Cardiac Enzymes: No results for input(s): CKTOTAL, CKMB, CKMBINDEX, TROPONINI in the last 168 hours. BNP: Invalid input(s): POCBNP CBG: Recent Labs  Lab 07/01/19 1114 07/01/19 1604 07/01/19 2144 07/02/19 0648 07/02/19 1153  GLUCAP 260* 155* 251* 224* 202*   D-Dimer No results for input(s): DDIMER in the last 72 hours. Hgb A1c No results for input(s): HGBA1C in the last 72 hours. Lipid Profile No results for input(s): CHOL, HDL, LDLCALC, TRIG, CHOLHDL, LDLDIRECT in the last 72 hours. Thyroid function studies Recent Labs    06/30/19 1634  TSH 1.749   Anemia work up No results for input(s): VITAMINB12,  FOLATE, FERRITIN, TIBC, IRON, RETICCTPCT in the last 72 hours. Urinalysis    Component Value Date/Time   COLORURINE YELLOW 12/25/2018 1158   APPEARANCEUR CLEAR 12/25/2018 1158   LABSPEC 1.035 12/25/2018 1158   PHURINE < OR = 5.0 12/25/2018 1158   GLUCOSEU 3+ (A) 12/25/2018 1158   HGBUR NEGATIVE 12/25/2018 1158   BILIRUBINUR NEGATIVE 10/05/2016 1045   KETONESUR NEGATIVE 12/25/2018 1158   PROTEINUR NEGATIVE 12/25/2018 1158   UROBILINOGEN 0.2 09/30/2014 1144   NITRITE NEGATIVE 12/25/2018 1158   LEUKOCYTESUR NEGATIVE 12/25/2018 1158   Sepsis Labs Invalid input(s): PROCALCITONIN,  WBC,  LACTICIDVEN Microbiology Recent Results (from the past 240 hour(s))  SARS Coronavirus 2 (CEPHEID - Performed in [place] hospital lab), Hosp Order     Status: None   Collection Time: 06/30/19  3:42 PM   Specimen: Nasopharyngeal Swab  Result Value Ref Range Status   SARS Coronavirus 2 NEGATIVE NEGATIVE Final    Comment: (NOTE) If result is NEGATIVE SARS-CoV-2 target nucleic acids are  NOT DETECTED. The SARS-CoV-2 RNA is generally detectable in upper and lower  respiratory specimens during the acute phase of infection. The lowest  concentration of SARS-CoV-2 viral copies this assay can detect is 250  copies / mL. A negative result does not preclude SARS-CoV-2 infection  and should not be used as the sole basis for treatment or other  patient management decisions.  A negative result may occur with  improper specimen collection / handling, submission of specimen other  than nasopharyngeal swab, presence of viral mutation(s) within the  areas targeted by this assay, and inadequate number of viral copies  (<250 copies / mL). A negative result must be combined with clinical  observations, patient history, and epidemiological information. If result is POSITIVE SARS-CoV-2 target nucleic acids are DETECTED. The SARS-CoV-2 RNA is generally detectable in upper and lower  respiratory specimens dur ing the  acute phase of infection.  Positive  results are indicative of active infection with SARS-CoV-2.  Clinical  correlation with patient history and other diagnostic information is  necessary to determine patient infection status.  Positive results do  not rule out bacterial infection or co-infection with other viruses. If result is PRESUMPTIVE POSTIVE SARS-CoV-2 nucleic acids MAY BE PRESENT.   A presumptive positive result was obtained on the submitted specimen  and confirmed on repeat testing.  While 2019 novel coronavirus  (SARS-CoV-2) nucleic acids may be present in the submitted sample  additional confirmatory testing may be necessary for epidemiological  and / or clinical management purposes  to differentiate between  SARS-CoV-2 and other Sarbecovirus currently known to infect humans.  If clinically indicated additional testing with an alternate test  methodology [phone]) is advised. The SARS-CoV-2 RNA is generally  detectable in upper and lower respiratory sp ecimens during the acute  phase of infection. The expected result is Negative. Fact Sheet for Patients:  StrictlyIdeas.no Fact Sheet for Healthcare Providers: BankingDealers.co.za This test is not yet approved or cleared by the [place] FDA and has been authorized for detection and/or diagnosis of SARS-CoV-2 by FDA under an Emergency Use Authorization (EUA).  This EUA will remain in effect (meaning this test can be used) for the duration of the COVID-19 declaration under Section 564(b)(1) of the Act, 21 U.S.C. section 360bbb-3(b)(1), unless the authorization is terminated or revoked sooner. Performed at [facility], [place] [address], [place], [place] [address]      Time coordinating discharge: Over 30 minutes  SIGNED:   Darliss Cheney, MD  [facility] 07/02/2019, 1:57 PM Pager [phone]  If 7PM-7AM, please contact night-coverage [email or web address] Password  [email or web address]

## 2019-07-02 NOTE — Progress Notes (Addendum)
Per insurance check on Eliquis- pt will need prior approval- information provided to patient-  Along with 30 day free card and copay assist card  # 1. S/W RICHARD @ [facility] RX # [phone]    ELIQUIS 5 MG BID  COVER- NOT COVER  PRIOR APPROVAL- YES # [phone]   DEDUCTIBLE : NOT MET   PREFERRED PHARMACY : YES  [facility]    ALTERNATIVE:  1. XARELTO 15 MG BID  COVER- YES  CO-PAY- $ 48.84  TIER- 3 DRUG  PRIOR APPROVAL- NO  90 DAY SUPPLY FOR M/O $ 131.28   2.Marland Kitchen XARELTO 20 MG DAILY  COVER -YES  CO-PAY- $ 24.42  TIER- 3 DRUG  PRIOR APPROVAL- YES  90 DAY SUPPLY FOR M/O $ 65.64   3. WARFARIN 10 MG BID  COVER- YES  CO-PAY- 100 %  TIER- 3 DRUG  PRIOR APPROVAL- NO

## 2019-07-02 NOTE — TOC Benefit Eligibility Note (Signed)
Transition of Care [facility]) Benefit Eligibility Note    Patient Details  Name: Andrea Bush MRN: [id number] Date of Birth: [birth year]   Medication/Dose: Arne Cleveland   5 MG  BID  Covered?: No        Spoke with Person/Company/Phone Number:: RICHARD  @  [facility] RX # [phone]     Prior Approval: [id number])          Memory Argue Phone Number: 07/02/2019, 11:30 AM

## 2019-07-02 NOTE — Progress Notes (Signed)
Inpatient Diabetes Program Recommendations  AACE/ADA: New Consensus Statement on Inpatient Glycemic Control  Target Ranges:  Prepandial:   less than 140 mg/dL      Peak postprandial:   less than 180 mg/dL (1-2 hours)      Critically ill patients:  140 - 180 mg/dL   Results for ALIANA, KREISCHER (MRN [id number]) as of 07/02/2019 12:28  Ref. Range 07/02/2019 06:48 07/02/2019 11:53  Glucose-Capillary Latest Ref Range: 70 - 99 mg/dL 224 (H) 202 (H)   Results for STATIA, BURDICK (MRN [id number]) as of 07/02/2019 11:39  Ref. Range 07/01/2019 06:42 07/01/2019 11:14 07/01/2019 16:04 07/01/2019 21:44  Glucose-Capillary Latest Ref Range: 70 - 99 mg/dL 230 (H) 260 (H) 155 (H) 251 (H)   Results for JACKLYNE, BAIK (MRN [id number]) as of 07/01/2019 13:29  Ref. Range 12/25/2018 11:58 04/15/2019 13:05  Hemoglobin A1C Latest Ref Range: <5.7 % of total Hgb 10.5 (H) 10.0 (H)   Review of Glycemic Control  Diabetes history: DM2 Outpatient Diabetes medications: Jardiance 25 mg daily, Metformin XR 500 mg QPM Current orders for Inpatient glycemic control: Novolog 0-15 units TID with meals  Inpatient Diabetes Program Recommendations:   Insulin-Basal: Please consider ordering Lantus 10 units Q24H.  Insulin-Correction: Please consider ordering Novolog 0-5 units QHS for bedtime correction.  Thanks, Barnie Alderman, RN, MSN, CDE Diabetes Coordinator Inpatient Diabetes Program [phone] (Team Pager from 8am to 5pm)

## 2019-07-02 NOTE — Progress Notes (Signed)
Spouse called to update on patient, he was on on the phone with patient at that time. Will monitor patient. Christerpher Clos, Bettina Gavia RN

## 2019-07-02 NOTE — Progress Notes (Signed)
Patient given discharge instructions, medication list and prescriptions sent to personal pharmacy. IV and tele were dcd patient verbalized understanding. All questions answered will discharge home as ordered. Jawanza Zambito, Bettina Gavia RN

## 2019-07-03 ENCOUNTER — Telehealth: Payer: Self-pay

## 2019-07-03 NOTE — Telephone Encounter (Signed)
-----   Message from Vicie Mutters, [place] sent at 07/03/2019  1:11 PM EDT ----- Regarding: RE: [place]: [phone] Can she check with her insurance? Is xarelto cheaper? If she needs samples and can give her some samples in the mean time.  Need to have hospital follow up as well. Estill Bamberg ----- Message ----- From: Elenor Quinones, CMA Sent: 07/03/2019   9:07 AM EDT To: Vicie Mutters, PA-C Subject: MED COST                                       Just released from [facility] Re: Clot.  They gave her a Rx for ELIQUIS, even with a coupon it still cost $600. She wants to know if there is anything else she can take that will not cost so much?    [facility]

## 2019-07-03 NOTE — Telephone Encounter (Signed)
Patient states that she was able to pick up the [facility] & will call next month maybe for samples

## 2019-07-07 NOTE — Progress Notes (Signed)
Hospital follow up  Assessment and Plan: Hospital visit follow up for:  Subacute massive pulmonary embolism ([place]) -     apixaban (ELIQUIS) 5 MG TABS tablet; Take 1 tablet (5 mg total) by mouth 2 (two) times daily. - given samples - personal and family history of blood clots, 2nd blood clot, will stay on elliquis - suggest getting UTD on cancer screening, will look into getting cologuard, if not covered, will wait 6 months at least to do colonoscopy  Obesity (BMI 30-39.9) - increase veggies, decrease carbs - long discussion about weight loss, diet, and exercise  WILL GET ALL LABS AT 08/10 APPOINTMENT NO SYMPTOMS AT THIS TIME START TO CHECK SUGARS AT HOME AGAIN  All medications were reviewed with patient and family and fully reconciled. All questions answered fully, and patient and family members were encouraged to call the office with any further questions or concerns. Discussed goal to avoid readmission related to this diagnosis.  Medications Discontinued During This Encounter  Medication Reason  . aspirin EC 81 MG tablet     Over 40 minutes of exam, counseling, chart review, and complex, high/moderate level critical decision making was performed this visit.   Future Appointments  Date Time Provider [place]  07/21/2019  9:30 AM Vicie Mutters, PA-C [facility] None  01/05/2020 10:00 AM Vicie Mutters, PA-C [facility] None     HPI 53 y.o.female presents for follow up for transition from recent hospitalization or SNIF stay. Admit date to the hospital was 06/30/19, patient was discharged from the hospital on 07/02/19 and our clinical staff contacted the office the day after discharge to set up a follow up appointment. The discharge summary, medications, and diagnostic test results were reviewed before meeting with the patient. The patient was admitted for:   SOB found to be due to massive bilateral pulmonary emboli with marked right heart strain. Not eligible for EKOS.  Switched from heparin to elliquis for discharge.  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 She is not on a hormone replacement. She has not had any trauma.  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  She is UTD on her MGM and PAP, she has never had a colonoscopy.  No nose bleeds, no dark black stool. No leg swelling.   Home health is not involved.   Images while in the hospital: Dg Chest 2 View  Result Date: 06/30/2019 CLINICAL DATA:  Chest pain.  History of pulmonary embolism. EXAM: CHEST - 2 VIEW COMPARISON:  01/21/2005 CT chest report.  No prior chest radiograph. FINDINGS: Midline trachea.  Normal heart size and mediastinal contours. Sharp costophrenic angles.  No pneumothorax.  Clear lungs. Mild convex right thoracic spine curvature. IMPRESSION: No active cardiopulmonary disease. Electronically Signed   By: Abigail Miyamoto M.D.   On: 06/30/2019 12:44   Ct Angio Chest Pe W And/or Wo Contrast  Result Date: 06/30/2019 CLINICAL DATA:  Shortness of breath today. History of DVT and pulmonary embolism. EXAM: CT ANGIOGRAPHY CHEST WITH CONTRAST TECHNIQUE: Multidetector CT imaging of the chest was performed using the standard protocol during bolus administration of intravenous contrast. Multiplanar CT image reconstructions and MIPs were obtained to evaluate the vascular anatomy. CONTRAST:  153mL OMNIPAQUE IOHEXOL 350 MG/ML SOLN COMPARISON:  Chest radiographs obtained earlier today. Chest CTA report dated 01/21/2005. FINDINGS: Cardiovascular: Multiple large and moderate-sized bilateral pulmonary arterial filling defects, including large defects in both main pulmonary arteries, with almost complete occlusion of the  left main pulmonary artery. Associated enlargement of the right ventricle and right atrium a right ventricular to left ventricular ratio of 2.3. Mediastinum/Nodes: No enlarged mediastinal, hilar, or axillary lymph nodes. Thyroid  gland, trachea, and esophagus demonstrate no significant findings. Lungs/Pleura: Lungs are clear. No pleural effusion or pneumothorax. Upper Abdomen: Refluxed contrast into the inferior vena cava and hepatic veins. Musculoskeletal: Mild thoracic spine degenerative changes. Review of the MIP images confirms the above findings. IMPRESSION: Massive bilateral pulmonary emboli with marked right heart strain with a right ventricular to left ventricular ratio of 2.3. There is associated reflux of contrast into the inferior vena cava and hepatic veins. The presence of right heart strain has been associated with an increased risk of morbidity and mortality. Please activate Code PE by paging [phone]. Critical Value/emergent results were called by telephone at the time of interpretation on 06/30/2019 at 3:47 pm to Dr. Melina Copa, who verbally acknowledged these results. Electronically Signed   By: Claudie Revering M.D.   On: 06/30/2019 15:51   Vas [place] Lower Extremity Venous (dvt)  Result Date: 07/01/2019  Lower Venous Study Indications: Pulmonary embolism.  Risk Factors: None identified. Limitations: Body habitus and poor ultrasound/tissue interface. Comparison Study: No prior studies. Performing Technologist: Oliver Hum RVT  Examination Guidelines: A complete evaluation includes B-mode imaging, spectral Doppler, color Doppler, and power Doppler as needed of all accessible portions of each vessel. Bilateral testing is considered an integral part of a complete examination. Limited examinations for reoccurring indications may be performed as noted.  +---------+---------------+---------+-----------+----------+-------+ RIGHT    CompressibilityPhasicitySpontaneityPropertiesSummary +---------+---------------+---------+-----------+----------+-------+ CFV      Full           Yes      Yes                          +---------+---------------+---------+-----------+----------+-------+ SFJ      Full                                                  +---------+---------------+---------+-----------+----------+-------+ FV Prox  Full                                                 +---------+---------------+---------+-----------+----------+-------+ FV Mid   Full                                                 +---------+---------------+---------+-----------+----------+-------+ FV DistalFull                                                 +---------+---------------+---------+-----------+----------+-------+ PFV      Full                                                 +---------+---------------+---------+-----------+----------+-------+ POP      Full  Yes      Yes                          +---------+---------------+---------+-----------+----------+-------+ PTV      Full                                                 +---------+---------------+---------+-----------+----------+-------+ PERO     Full                                                 +---------+---------------+---------+-----------+----------+-------+   +---------+---------------+---------+-----------+----------+-------+ LEFT     CompressibilityPhasicitySpontaneityPropertiesSummary +---------+---------------+---------+-----------+----------+-------+ CFV      Partial        Yes      Yes                  Acute   +---------+---------------+---------+-----------+----------+-------+ SFJ      Full                                                 +---------+---------------+---------+-----------+----------+-------+ FV Prox  Full                                                 +---------+---------------+---------+-----------+----------+-------+ FV Mid   Full                                                 +---------+---------------+---------+-----------+----------+-------+ FV DistalFull                                                  +---------+---------------+---------+-----------+----------+-------+ PFV      Full                                                 +---------+---------------+---------+-----------+----------+-------+ POP      Full           Yes      Yes                          +---------+---------------+---------+-----------+----------+-------+ PTV      Full                                                 +---------+---------------+---------+-----------+----------+-------+ PERO     Full                                                 +---------+---------------+---------+-----------+----------+-------+  EIV      Full           Yes      Yes                          +---------+---------------+---------+-----------+----------+-------+ CIV                     Yes      Yes                          +---------+---------------+---------+-----------+----------+-------+ The external, and common iliac veins appear patent.    Summary: Right: There is no evidence of deep vein thrombosis in the lower extremity. Left: Findings consistent with acute deep vein thrombosis involving the left common femoral vein. No cystic structure found in the popliteal fossa. The external, and common iliac veins appear patent.  *See table(s) above for measurements and observations. Electronically signed by Deitra Mayo MD on 07/01/2019 at 6:00:43 PM.    Final      Current Outpatient Medications (Endocrine & Metabolic):  .  empagliflozin (JARDIANCE) 25 MG TABS tablet, Take 1 tablet Daily for Diabetes (Patient taking differently: Take 25 mg by mouth daily. Take 1 tablet Daily for Diabetes) .  metFORMIN (GLUCOPHAGE-XR) 500 MG 24 hr tablet, START BY TAKING 1 TABLET BY MOUTH WITH DINNER. (Patient taking differently: Take 500 mg by mouth every evening. START BY TAKING 1 TABLET BY MOUTH WITH DINNER.)  Current Outpatient Medications (Cardiovascular):  .  lisinopril (ZESTRIL) 20 MG tablet, TAKE 1 TABLET BY MOUTH EVERY  DAY (Patient taking differently: Take 20 mg by mouth daily. )  Current Outpatient Medications (Respiratory):  .  cetirizine (ZYRTEC) 10 MG tablet, Take 10 mg by mouth daily.  Current Outpatient Medications (Analgesics):  .  acetaminophen (TYLENOL) 500 MG tablet, Take 1,000 mg by mouth every 6 (six) hours as needed for mild pain.  Current Outpatient Medications (Hematological):  .  Eliquis DVT/PE Starter Pack (ELIQUIS STARTER PACK) 5 MG TABS, Take as directed on package: start with two-5mg  tablets twice daily for 7 days. On day 8, switch to one-5mg  tablet twice daily. Marland Kitchen  apixaban (ELIQUIS) 5 MG TABS tablet, Take 1 tablet (5 mg total) by mouth 2 (two) times daily.  Current Outpatient Medications (Other):  Marland Kitchen  Cholecalciferol 5000 units capsule, Take 5,000 Units by mouth daily. .  famotidine (PEPCID) 40 MG tablet, Take 1 tablet (40 mg total) by mouth every evening. (Patient taking differently: Take 40 mg by mouth daily as needed for heartburn or indigestion. )  Past Medical History:  Diagnosis Date  . Fibrocystic breast   . Hyperlipidemia   . Hypertension   . Hypothyroidism   . Iron deficiency anemia   . Obesity    BMI 41  . Prediabetes   . Pulmonary embolism ([place]) 2006   from BCP  . Vitamin D deficiency      Allergies  Allergen Reactions  . Farxiga [Dapagliflozin] Anaphylaxis  . Crestor [Rosuvastatin]     Hair Loss   . Penicillins Rash    Did it involve swelling of the face/tongue/throat, SOB, or low BP? No Did it involve sudden or severe rash/hives, skin peeling, or any reaction on the inside of your mouth or nose? No Did you need to seek medical attention at a hospital or doctor's office? No When did it last happen? If all above answers are "NO", may proceed with cephalosporin  use.    ROS: all negative except above.   Physical Exam: Filed Weights   07/08/19 1002  Weight: 264 lb (119.7 kg)   BP 128/82   Pulse 96   Temp (!) 97.5 F (36.4 C)   Ht 5\' 9"  (1.753  m)   Wt 264 lb (119.7 kg)   SpO2 94%   BMI 38.99 kg/m  General Appearance: Well nourished, in no apparent distress. Eyes: PERRLA, EOMs, conjunctiva no swelling or erythema Sinuses: No Frontal/maxillary tenderness ENT/Mouth: Ext aud canals clear, TMs without erythema, bulging. No erythema, swelling, or exudate on post pharynx.  Tonsils not swollen or erythematous. Hearing normal.  Neck: Supple, thyroid normal.  Respiratory: Respiratory effort normal, BS equal bilaterally without rales, rhonchi, wheezing or stridor.  Cardio: RRR with no MRGs. Brisk peripheral pulses without edema.  Abdomen: Soft, + BS.  Non tender, no guarding, rebound, hernias, masses. Lymphatics: Non tender without lymphadenopathy.  Musculoskeletal: Full ROM, 5/5 strength, normal gait.  Skin: + varicose veins bilateral legs, no edema, no hard cords. Warm, dry without rashes, lesions, ecchymosis.  Neuro: Cranial nerves intact. Normal muscle tone, no cerebellar symptoms. Sensation intact.  Psych: Awake and oriented X 3, normal affect, Insight and Judgment appropriate.     Vicie Mutters, PA-C 10:34 AM [facility]

## 2019-07-08 ENCOUNTER — Encounter: Payer: Self-pay | Admitting: Physician Assistant

## 2019-07-08 ENCOUNTER — Ambulatory Visit (INDEPENDENT_AMBULATORY_CARE_PROVIDER_SITE_OTHER): Payer: 59 | Admitting: Physician Assistant

## 2019-07-08 ENCOUNTER — Other Ambulatory Visit: Payer: Self-pay | Admitting: Physician Assistant

## 2019-07-08 ENCOUNTER — Other Ambulatory Visit: Payer: Self-pay

## 2019-07-08 VITALS — BP 128/82 | HR 96 | Temp 97.5°F | Ht 69.0 in | Wt 264.0 lb

## 2019-07-08 DIAGNOSIS — I2699 Other pulmonary embolism without acute cor pulmonale: Secondary | ICD-10-CM | POA: Diagnosis not present

## 2019-07-08 DIAGNOSIS — E669 Obesity, unspecified: Secondary | ICD-10-CM | POA: Diagnosis not present

## 2019-07-08 MED ORDER — APIXABAN 5 MG PO TABS: 5.0000 mg | ORAL_TABLET | Freq: Two times a day (BID) | ORAL | 6 refills | Status: DC

## 2019-07-08 NOTE — Patient Instructions (Addendum)
COLOGUARD INFORMATION   Cologuard is an easy to use noninvasive colon cancer screening test based on the latest advances in stool DNA science.   Colon cancer is 3rd most diagnosed cancer and 2nd leading cause of death in both men and women 55 years of age and older despite being one of the most preventable and treatable cancers if found early.  4 of out 5 people diagnosed with colon cancer have NO prior family history.  When caught EARLY 90% of colon cancer is curable.   More than 92% of cologuard patients have NO out of pocket cost for screening however only your insurer can confirm how Cologuard would be covered for you. Cologuard has a team of specialist that can help you contact your insurer and ask the right questions. Please call [phone] so they can help.   You will receive a short call from [place] support center at [employer], when you receive a call they will say they are from [place],  to confirm your mailing address and give you more information.  When they calll you, it will appear on the caller ID as "[employer]" or in some cases only this number will appear, [phone].   [employer] will ship your collection kit directly to you. You will collect a single stool sample in the privacy of your own home, no special preparation required. You will return the kit via [place] pre-paid shipping or pick-up, in the same box it arrived in. Then I will contact you to discuss your results after I receive them from the laboratory.   If you have any questions or concerns, C[employer] Customer Support Specialist are available 24 hours a day, 7 days a week at [phone] or go to [email or web address].     I want to challenge you to lose 10 lbs in 3 months.  That would be about 3 lbs a month!  This is very achievable and I know you can do it.  10 lbs is actually 40 lbs of pressure of your joints, back, hips, knees and ankles.  10lbs may  be enough to help your blood pressure and cholesterol for next visit.    Pulmonary Embolism  A pulmonary embolism (PE) is a sudden blockage or decrease of blood flow in one or both lungs. Most blockages come from a blood clot that forms in the vein of a lower leg, thigh, or arm (deep vein thrombosis, DVT) and travels to the lungs. A clot is blood that has thickened into a gel or solid. PE is a dangerous and life-threatening condition that needs to be treated right away. What are the causes? This condition is usually caused by a blood clot that forms in a vein and moves to the lungs. In rare cases, it may be caused by air, fat, part of a tumor, or other tissue that moves through the veins and into the lungs. What increases the risk? The following factors may make you more likely to develop this condition:  Experiencing a traumatic injury, such as breaking a hip or leg.  Having: ? A spinal cord injury. ? Orthopedic surgery, especially hip or knee replacement. ? Any major surgery. ? A stroke. ? DVT. ? Blood clots or blood clotting disease. ? Long-term (chronic) lung or heart disease. ? Cancer treated with chemotherapy. ? A central venous catheter.  Taking medicines that contain estrogen. These include birth control pills and hormone replacement therapy.  Being: ? Pregnant. ? In the period of time  after your baby is delivered (postpartum). ? Older than age 59. ? Overweight. ? A smoker, especially if you have other risks. What are the signs or symptoms? Symptoms of this condition usually start suddenly and include:  Shortness of breath during activity or at rest.  Coughing, coughing up blood, or coughing up blood-tinged mucus.  Chest pain that is often worse with deep breaths.  Rapid or irregular heartbeat.  Feeling light-headed or dizzy.  Fainting.  Feeling anxious.  Fever.  Sweating.  Pain and swelling in a leg. This is a symptom of DVT, which can lead to PE. How is  this diagnosed? This condition may be diagnosed based on:  Your medical history.  A physical exam.  Blood tests.  CT pulmonary angiogram. This test checks blood flow in and around your lungs.  Ventilation-perfusion scan, also called a lung VQ scan. This test measures air flow and blood flow to the lungs.  An ultrasound of the legs. How is this treated? Treatment for this condition depends on many factors, such as the cause of your PE, your risk for bleeding or developing more clots, and other medical conditions you have. Treatment aims to remove, dissolve, or stop blood clots from forming or growing larger. Treatment may include:  Medicines, such as: ? Blood thinning medicines (anticoagulants) to stop clots from forming. ? Medicines that dissolve clots (thrombolytics).  Procedures, such as: ? Using a flexible tube to remove a blood clot (embolectomy) or to deliver medicine to destroy it (catheter-directed thrombolysis). ? Inserting a filter into a large vein that carries blood to the heart (inferior vena cava). This filter (vena cava filter) catches blood clots before they reach the lungs. ? Surgery to remove the clot (surgical embolectomy). This is rare. You may need a combination of immediate, long-term (up to 3 months after diagnosis), and extended (more than 3 months after diagnosis) treatments. Your treatment may continue for several months (maintenance therapy). You and your health care provider will work together to choose the treatment program that is best for you. Follow these instructions at home: Medicines  Take over-the-counter and prescription medicines only as told by your health care provider.  If you are taking an anticoagulant medicine: ? Take the medicine every day at the same time each day. ? Understand what foods and drugs interact with your medicine. ? Understand the side effects of this medicine, including excessive bruising or bleeding. Ask your health care  provider or pharmacist about other side effects. General instructions  Wear a medical alert bracelet or carry a medical alert card that says you have had a PE and lists what medicines you take.  Ask your health care provider when you may return to your normal activities. Avoid sitting or lying for a long time without moving.  Maintain a healthy weight. Ask your health care provider what weight is healthy for you.  Do not use any products that contain nicotine or tobacco, such as cigarettes, e-cigarettes, and chewing tobacco. If you need help quitting, ask your health care provider.  Talk with your health care provider about any travel plans. It is important to make sure that you are still able to take your medicine while on trips.  Keep all follow-up visits as told by your health care provider. This is important. Contact a health care provider if:  You missed a dose of your blood thinner medicine. Get help right away if:  You have: ? New or increased pain, swelling, warmth, or redness in  an arm or leg. ? Numbness or tingling in an arm or leg. ? Shortness of breath during activity or at rest. ? A fever. ? Chest pain. ? A rapid or irregular heartbeat. ? A severe headache. ? Vision changes. ? A serious fall or accident, or you hit your head. ? Stomach (abdominal) pain. ? Blood in your vomit, stool, or urine. ? A cut that will not stop bleeding.  You cough up blood.  You feel light-headed or dizzy.  You cannot move your arms or legs.  You are confused or have memory loss. These symptoms may represent a serious problem that is an emergency. Do not wait to see if the symptoms will go away. Get medical help right away. Call your local emergency services (911 in the [place] Do not drive yourself to the hospital. Summary  A pulmonary embolism (PE) is a sudden blockage or decrease of blood flow in one or both lungs. PE is a dangerous and life-threatening condition that needs to be  treated right away.  Treatments for this condition usually include medicines to thin your blood (anticoagulants) or medicines to break apart blood clots (thrombolytics).  If you are given blood thinners, it is important to take the medicine every day at the same time each day.  Understand what foods and drugs interact with any medicines that you are taking.  If you have signs of PE or DVT, call your local emergency services (911 in the [place] This information is not intended to replace advice given to you by your health care provider. Make sure you discuss any questions you have with your health care provider. Document Released: 11/24/2000 Document Revised: 09/04/2018 Document Reviewed: 09/04/2018 Elsevier Patient Education  2020 [employer].

## 2019-07-11 ENCOUNTER — Other Ambulatory Visit: Payer: Self-pay | Admitting: Physician Assistant

## 2019-07-11 DIAGNOSIS — K2100 Gastro-esophageal reflux disease with esophagitis, without bleeding: Secondary | ICD-10-CM

## 2019-07-14 NOTE — Progress Notes (Deleted)
FOLLOW UP  Assessment and Plan:   Essential hypertension -     CBC with Differential/Platelet -     COMPLETE METABOLIC PANEL WITH GFR -     TSH - continue medications, DASH diet, exercise and monitor at home. Call if greater than 130/80.   Type 2 diabetes mellitus with hyperlipidemia (HCC) -     Hemoglobin A1c -     empagliflozin (JARDIANCE) 25 MG TABS tablet; Take 25 mg by mouth daily. - stop farxiga, try jardiance- may need to add on another medication - patient is very resistant to meds - stressed diet/increasing exercise.   Hyperlipidemia, unspecified hyperlipidemia type -     Lipid panel check lipids decrease fatty foods increase activity.   Morbid obesity ([place]) - follow up 3 months for progress monitoring - increase veggies, decrease carbs - long discussion about weight loss, diet, and exercise  Gastroesophageal reflux disease with esophagitis -     famotidine (PEPCID) 40 MG tablet; Take 1 tablet (40 mg total) by mouth every evening. H2 blocker, diet discussed  Continue diet and meds as discussed. Further disposition pending results of labs. Discussed med's effects and SE's.   Over 30 minutes of exam, counseling, chart review, and critical decision making was performed.   Future Appointments  Date Time Provider [place]  07/21/2019  9:30 AM Vicie Mutters, PA-C [facility] None  01/05/2020 10:00 AM Vicie Mutters, PA-C [facility] None    ----------------------------------------------------------------------------------------------------------------------  HPI 53 y.o. female  presents for 3 month follow up on hypertension, cholesterol, diabetes, morbid obesity and vitamin D deficiency.   Senior in the house, and that has been very depressing but she is starting [employer] 09/07.   Had bilateral PE, on Eliquis now.   BMI is There is no height or weight on file to calculate BMI., she is working on diet and exercise; she reports she has  increased her water intake, walking daily, increasing vegetables and baking rather than frying. She stopped phentermine as doesn't want to use medications for weight loss.  Wt Readings from Last 3 Encounters:  07/08/19 264 lb (119.7 kg)  06/30/19 266 lb 14.4 oz (121.1 kg)  04/15/19 270 lb (122.5 kg)   She has not been checkiing BP at home, today their BP is    She does workout. She denies chest pain, shortness of breath, dizziness.   She is not on cholesterol medication and denies myalgias. Her cholesterol is not at goal. The cholesterol last visit was:   Lab Results  Component Value Date   CHOL 243 (H) 04/15/2019   HDL 55 04/15/2019   LDLCALC 144 (H) 04/15/2019   TRIG 289 (H) 04/15/2019   CHOLHDL 4.4 04/15/2019    She has been working on diet and exercise for T2 diabetes, she is very resistant to starting on medications, switched from [place] to jardiance due to nasopharyngitis symptoms and metformin.  She denies foot ulcerations, increased appetite, nausea, paresthesia of the feet, polydipsia, polyuria, visual disturbances, vomiting and weight loss. She has a meter at home but has not been checking. Last A1C in the office was:  Lab Results  Component Value Date   HGBA1C 10.0 (H) 04/15/2019   Patient is not on Vitamin D supplement.   Lab Results  Component Value Date   VD25OH 19 (L) 12/25/2018       Current Medications:  Current Outpatient Medications on File Prior to Visit  Medication Sig  . acetaminophen (TYLENOL) 500 MG tablet Take 1,000 mg  by mouth every 6 (six) hours as needed for mild pain.  Marland Kitchen apixaban (ELIQUIS) 5 MG TABS tablet Take 1 tablet (5 mg total) by mouth 2 (two) times daily.  . cetirizine (ZYRTEC) 10 MG tablet Take 10 mg by mouth daily.  . Cholecalciferol 5000 units capsule Take 5,000 Units by mouth daily.  . Eliquis DVT/PE Starter Pack (ELIQUIS STARTER PACK) 5 MG TABS Take as directed on package: start with two-5mg  tablets twice daily for 7 days. On day 8,  switch to one-5mg  tablet twice daily.  . empagliflozin (JARDIANCE) 25 MG TABS tablet Take 1 tablet Daily for Diabetes (Patient taking differently: Take 25 mg by mouth daily. Take 1 tablet Daily for Diabetes)  . famotidine (PEPCID) 20 MG tablet Take 1 tab twice daily as needed for acid reflux.  Marland Kitchen lisinopril (ZESTRIL) 20 MG tablet TAKE 1 TABLET BY MOUTH EVERY DAY (Patient taking differently: Take 20 mg by mouth daily. )  . metFORMIN (GLUCOPHAGE-XR) 500 MG 24 hr tablet START BY TAKING 1 TABLET BY MOUTH WITH DINNER. (Patient taking differently: Take 500 mg by mouth every evening. START BY TAKING 1 TABLET BY MOUTH WITH DINNER.)   No current facility-administered medications on file prior to visit.      Allergies:  Allergies  Allergen Reactions  . Farxiga [Dapagliflozin] Anaphylaxis  . Crestor [Rosuvastatin]     Hair Loss   . Penicillins Rash    Did it involve swelling of the face/tongue/throat, SOB, or low BP? No Did it involve sudden or severe rash/hives, skin peeling, or any reaction on the inside of your mouth or nose? No Did you need to seek medical attention at a hospital or doctor's office? No When did it last happen? If all above answers are "NO", may proceed with cephalosporin use.     Medical History:  Past Medical History:  Diagnosis Date  . Fibrocystic breast   . Hyperlipidemia   . Hypertension   . Hypothyroidism   . Iron deficiency anemia   . Obesity    BMI 41  . Prediabetes   . Pulmonary embolism ([place]) 2006   from BCP  . Vitamin D deficiency    Family history- Reviewed and unchanged Social history- Reviewed and unchanged   Review of Systems:  Review of Systems  Constitutional: Negative for chills, fever, malaise/fatigue and weight loss.  HENT: Negative for congestion, ear discharge, hearing loss, nosebleeds, sinus pain, sore throat and tinnitus.   Eyes: Negative for blurred vision and double vision.  Respiratory: Negative for cough, hemoptysis, sputum  production, shortness of breath, wheezing and stridor.   Cardiovascular: Negative for chest pain, palpitations, orthopnea, claudication, leg swelling and PND.  Gastrointestinal: Negative for abdominal pain, blood in stool, constipation, diarrhea, heartburn, melena, nausea and vomiting.  Genitourinary: Negative.   Musculoskeletal: Negative for joint pain and myalgias.  Skin: Negative for rash.  Neurological: Negative for dizziness, tingling, sensory change, weakness and headaches.  Endo/Heme/Allergies: Negative for polydipsia.  Psychiatric/Behavioral: Negative.   All other systems reviewed and are negative.   Physical Exam: There were no vitals taken for this visit. Wt Readings from Last 3 Encounters:  07/08/19 264 lb (119.7 kg)  06/30/19 266 lb 14.4 oz (121.1 kg)  04/15/19 270 lb (122.5 kg)   General Appearance: Well nourished, in no apparent distress. Eyes: PERRLA, EOMs, conjunctiva no swelling or erythema Sinuses: No Frontal/maxillary tenderness ENT/Mouth: Ext aud canals clear, TMs without erythema, bulging. No erythema, swelling, or exudate on post pharynx.  Tonsils not swollen or  erythematous. Hearing normal.  Neck: Supple, thyroid normal.  Respiratory: Respiratory effort normal, BS equal bilaterally without rales, rhonchi, wheezing or stridor.  Cardio: RRR with no MRGs. Brisk peripheral pulses without edema.  Abdomen: Soft, + BS.  Non tender, no guarding, rebound, hernias, masses. Lymphatics: Non tender without lymphadenopathy.  Musculoskeletal: Full ROM, 5/5 strength, Normal gait Skin: Warm, dry without rashes, lesions, ecchymosis.  Neuro: Cranial nerves intact. No cerebellar symptoms.  Psych: Awake and oriented X 3, normal affect, Insight and Judgment appropriate.    Vicie Mutters, PA-C 11:03 AM [facility]

## 2019-07-21 ENCOUNTER — Ambulatory Visit: Payer: 59 | Admitting: Physician Assistant

## 2019-07-28 NOTE — Progress Notes (Signed)
FOLLOW UP  Assessment and Plan:   Essential hypertension -     CBC with Differential/Platelet -     COMPLETE METABOLIC PANEL WITH GFR -     TSH - continue medications, DASH diet, exercise and monitor at home. Call if greater than 130/80.   Type 2 diabetes mellitus with hyperlipidemia (HCC) -     Hemoglobin A1c -     empagliflozin (JARDIANCE) 25 MG TABS tablet; Take 25 mg by mouth daily. - patient is very resistant to meds - stressed diet/increasing exercise.   Hyperlipidemia, unspecified hyperlipidemia type -     Lipid panel check lipids decrease fatty foods increase activity.   Morbid obesity ([place]) - follow up 3 months for progress monitoring - increase veggies, decrease carbs - long discussion about weight loss, diet, and exercise   Continue diet and meds as discussed. Further disposition pending results of labs. Discussed med's effects and SE's.   Over 30 minutes of exam, counseling, chart review, and critical decision making was performed.   Future Appointments  Date Time Provider [place]  01/05/2020 10:00 AM Vicie Mutters, PA-C [facility] None    ----------------------------------------------------------------------------------------------------------------------  HPI 53 y.o. female  presents for 3 month follow up on hypertension, cholesterol, diabetes, morbid obesity and vitamin D deficiency.   Senior in the house, and that has been very depressing but she is starting [employer] 09/07.   Had bilateral PE 06/30/2019, on Eliquis now, still having some fatigue but states she is doing better.   BMI is Body mass index is 39.58 kg/m., she is working on diet and exercise; she is working from home and states she is not walking as much still.  Wt Readings from Last 3 Encounters:  07/29/19 268 lb (121.6 kg)  07/08/19 264 lb (119.7 kg)  06/30/19 266 lb 14.4 oz (121.1 kg)   She has not been checkiing BP at home, today their BP is BP: 136/80  She  does workout. She denies chest pain, shortness of breath, dizziness.   She is not on cholesterol medication and denies myalgias. Her cholesterol is not at goal. The cholesterol last visit was:   Lab Results  Component Value Date   CHOL 243 (H) 04/15/2019   HDL 55 04/15/2019   LDLCALC 144 (H) 04/15/2019   TRIG 289 (H) 04/15/2019   CHOLHDL 4.4 04/15/2019    She has been working on diet and exercise for T2 diabetes she is very resistant to starting on medications switched from [place] to jardiance due to nasopharyngitis symptoms and states she is not having any issues  metformin once a day but has not taken for several weeks, not due to AE's denies foot ulcerations, increased appetite, nausea, paresthesia of the feet, polydipsia, polyuria, visual disturbances, vomiting and weight loss.  She has a meter at home but has not been checking.  Last A1C in the office was:  Lab Results  Component Value Date   HGBA1C 10.0 (H) 04/15/2019   Patient is not on Vitamin D supplement.   Lab Results  Component Value Date   VD25OH 19 (L) 12/25/2018       Current Medications:  Current Outpatient Medications on File Prior to Visit  Medication Sig  . acetaminophen (TYLENOL) 500 MG tablet Take 1,000 mg by mouth every 6 (six) hours as needed for mild pain.  Marland Kitchen apixaban (ELIQUIS) 5 MG TABS tablet Take 1 tablet (5 mg total) by mouth 2 (two) times daily.  . cetirizine (ZYRTEC) 10 MG tablet  Take 10 mg by mouth daily.  . Cholecalciferol 5000 units capsule Take 5,000 Units by mouth daily.  . empagliflozin (JARDIANCE) 25 MG TABS tablet Take 1 tablet Daily for Diabetes (Patient taking differently: Take 25 mg by mouth daily. Take 1 tablet Daily for Diabetes)  . famotidine (PEPCID) 20 MG tablet Take 1 tab twice daily as needed for acid reflux.  Marland Kitchen lisinopril (ZESTRIL) 20 MG tablet TAKE 1 TABLET BY MOUTH EVERY DAY (Patient taking differently: Take 20 mg by mouth daily. )  . metFORMIN (GLUCOPHAGE-XR) 500 MG 24 hr  tablet START BY TAKING 1 TABLET BY MOUTH WITH DINNER. (Patient taking differently: Take 500 mg by mouth every evening. START BY TAKING 1 TABLET BY MOUTH WITH DINNER.)   No current facility-administered medications on file prior to visit.      Allergies:  Allergies  Allergen Reactions  . Farxiga [Dapagliflozin] Anaphylaxis  . Crestor [Rosuvastatin]     Hair Loss   . Penicillins Rash    Did it involve swelling of the face/tongue/throat, SOB, or low BP? No Did it involve sudden or severe rash/hives, skin peeling, or any reaction on the inside of your mouth or nose? No Did you need to seek medical attention at a hospital or doctor's office? No When did it last happen? If all above answers are "NO", may proceed with cephalosporin use.     Medical History:  Past Medical History:  Diagnosis Date  . Fibrocystic breast   . Hyperlipidemia   . Hypertension   . Hypothyroidism   . Iron deficiency anemia   . Obesity    BMI 41  . Prediabetes   . Pulmonary embolism ([place]) 2006   from BCP  . Vitamin D deficiency    Family history- Reviewed and unchanged Social history- Reviewed and unchanged   Review of Systems:  Review of Systems  Constitutional: Negative for chills, fever, malaise/fatigue and weight loss.  HENT: Negative for congestion, ear discharge, hearing loss, nosebleeds, sinus pain, sore throat and tinnitus.   Eyes: Negative for blurred vision and double vision.  Respiratory: Negative for cough, hemoptysis, sputum production, shortness of breath, wheezing and stridor.   Cardiovascular: Negative for chest pain, palpitations, orthopnea, claudication, leg swelling and PND.  Gastrointestinal: Negative for abdominal pain, blood in stool, constipation, diarrhea, heartburn, melena, nausea and vomiting.  Genitourinary: Negative.   Musculoskeletal: Negative for joint pain and myalgias.  Skin: Negative for rash.  Neurological: Negative for dizziness, tingling, sensory change,  weakness and headaches.  Endo/Heme/Allergies: Negative for polydipsia.  Psychiatric/Behavioral: Negative.   All other systems reviewed and are negative.   Physical Exam: BP 136/80   Pulse 64   Temp (!) 97.5 F (36.4 C)   Ht 5\' 9"  (1.753 m)   Wt 268 lb (121.6 kg)   SpO2 98%   BMI 39.58 kg/m  Wt Readings from Last 3 Encounters:  07/29/19 268 lb (121.6 kg)  07/08/19 264 lb (119.7 kg)  06/30/19 266 lb 14.4 oz (121.1 kg)   General Appearance: Well nourished, in no apparent distress. Eyes: PERRLA, EOMs, conjunctiva no swelling or erythema Sinuses: No Frontal/maxillary tenderness ENT/Mouth: Ext aud canals clear, TMs without erythema, bulging. No erythema, swelling, or exudate on post pharynx.  Tonsils not swollen or erythematous. Hearing normal.  Neck: Supple, thyroid normal.  Respiratory: Respiratory effort normal, BS equal bilaterally without rales, rhonchi, wheezing or stridor.  Cardio: RRR with no MRGs. Brisk peripheral pulses without edema.  Abdomen: Soft, + BS.  Non tender, no guarding,  rebound, hernias, masses. Lymphatics: Non tender without lymphadenopathy.  Musculoskeletal: Full ROM, 5/5 strength, Normal gait Skin: Warm, dry without rashes, lesions, ecchymosis.  Neuro: Cranial nerves intact. No cerebellar symptoms.  Psych: Awake and oriented X 3, normal affect, Insight and Judgment appropriate.    Vicie Mutters, PA-C 3:55 PM [facility]

## 2019-07-29 ENCOUNTER — Encounter: Payer: Self-pay | Admitting: Physician Assistant

## 2019-07-29 ENCOUNTER — Other Ambulatory Visit: Payer: Self-pay

## 2019-07-29 ENCOUNTER — Ambulatory Visit (INDEPENDENT_AMBULATORY_CARE_PROVIDER_SITE_OTHER): Payer: 59 | Admitting: Physician Assistant

## 2019-07-29 VITALS — BP 136/80 | HR 64 | Temp 97.5°F | Ht 69.0 in | Wt 268.0 lb

## 2019-07-29 DIAGNOSIS — I10 Essential (primary) hypertension: Secondary | ICD-10-CM | POA: Diagnosis not present

## 2019-07-29 DIAGNOSIS — E1169 Type 2 diabetes mellitus with other specified complication: Secondary | ICD-10-CM

## 2019-07-29 DIAGNOSIS — Z86711 Personal history of pulmonary embolism: Secondary | ICD-10-CM

## 2019-07-29 DIAGNOSIS — E669 Obesity, unspecified: Secondary | ICD-10-CM

## 2019-07-29 DIAGNOSIS — Z79899 Other long term (current) drug therapy: Secondary | ICD-10-CM

## 2019-07-29 DIAGNOSIS — E785 Hyperlipidemia, unspecified: Secondary | ICD-10-CM | POA: Diagnosis not present

## 2019-07-29 DIAGNOSIS — E559 Vitamin D deficiency, unspecified: Secondary | ICD-10-CM

## 2019-07-29 DIAGNOSIS — D509 Iron deficiency anemia, unspecified: Secondary | ICD-10-CM

## 2019-07-29 MED ORDER — APIXABAN 5 MG PO TABS: 5.0000 mg | ORAL_TABLET | Freq: Two times a day (BID) | ORAL | 6 refills | Status: DC

## 2019-07-29 NOTE — Patient Instructions (Addendum)
RANGE OF A1C   Your A1C is a measure of your sugar over the past 3 months and is not affected by what you have eaten over the past few days. Diabetes increases your chances of stroke and heart attack over 300 % and is the leading cause of blindness and kidney failure in the [place]. Please make sure you decrease bad carbs like white bread, white rice, potatoes, corn, soft drinks, pasta, cereals, refined sugars, sweet tea, dried fruits, and fruit juice. Good carbs are okay to eat in moderation like sweet potatoes, brown rice, whole grain pasta/bread, most fruit (except dried fruit) and you can eat as many veggies as you want.   Greater than 6.5 is considered diabetic. Between 6.4 and 5.7 is prediabetic If your A1C is less than 5.7 you are NOT diabetic.  Targets for Glucose Readings: Time of Check Target for patients WITHOUT Diabetes Target for DIABETICS  Before Meals Less than 100  less than 150  Two hours after meals Less than 200  Less than 250    Check out ozempic as a medication  Check out  Mini habits for weight loss book  2 apps for tracking food is myfitness pal  loseit OR can take picture of your food   Drink 1/2 your body weight in fluid ounces of water daily; drink a tall glass of water 30 min before meals  Don't eat until you're stuffed- listen to your stomach and eat until you are 80% full   Try eating off of a salad plate; wait 10 min after finishing before going back for seconds  Start by eating the vegetables on your plate; aim for 50% of your meals to be fruits or vegetables  Then eat your protein - lean meats (grass fed if possible), fish, beans, nuts in moderation  Eat your carbs/starch last ONLY if you still are hungry. If you can, stop before finishing it all  Avoid sugar and flour - the closer it looks to it's original form in nature, typically the better it is for you  Splurge in moderation - "assign" days when you get to splurge and have the "bad  stuff" - I like to follow a 80% - 20% plan- "good" choices 80 % of the time, "bad" choices in moderation 20% of the time  Simple equation is: Calories out > calories in = weight loss - even if you eat the bad stuff, if you limit portions, you will still lose weight    VITAMIN D IS IMPORTANT  Vitamin D goal is between 60-80  Please make sure that you are taking your Vitamin D as directed.   It is very important as a natural anti-inflammatory   helping hair, skin, and nails, as well as reducing stroke and heart attack risk.   It helps your bones and helps with mood.  We want you on at least 5000 IU daily  It also decreases numerous cancer risks so please take it as directed.   Low Vit D is associated with a 200-300% higher risk for CANCER   and 200-300% higher risk for HEART   ATTACK  &  STROKE.    .....................................Marland Kitchen  It is also associated with higher death rate at younger ages,   autoimmune diseases like Rheumatoid arthritis, Lupus, Multiple Sclerosis.     Also many other serious conditions, like depression, Alzheimer's  Dementia, infertility, muscle aches, fatigue, fibromyalgia - just to name a few.  +++++++++++++++++++  Can get liquid vitamin D  from [place] here in [place] at  [facility] [address] [place], [place] [address] Or you can try earth fare

## 2019-07-30 ENCOUNTER — Telehealth: Payer: Self-pay | Admitting: Physician Assistant

## 2019-07-30 LAB — COMPLETE METABOLIC PANEL WITH GFR
AG Ratio: 1.3 (calc) (ref 1.0–2.5)
ALT: 32 U/L — ABNORMAL HIGH (ref 6–29)
AST: 24 U/L (ref 10–35)
Albumin: 4 g/dL (ref 3.6–5.1)
Alkaline phosphatase (APISO): 45 U/L (ref 37–153)
BUN/Creatinine Ratio: 21 (calc) (ref 6–22)
BUN: 22 mg/dL (ref 7–25)
CO2: 23 mmol/L (ref 20–32)
Calcium: 9.6 mg/dL (ref 8.6–10.4)
Chloride: 104 mmol/L (ref 98–110)
Creat: 1.07 mg/dL — ABNORMAL HIGH (ref 0.50–1.05)
GFR, Est African American: 68 mL/min/{1.73_m2} (ref >=60)
GFR, Est Non African American: 58 mL/min/{1.73_m2} — ABNORMAL LOW (ref >=60)
Globulin: 3.2 g/dL (calc) (ref 1.9–3.7)
Glucose, Bld: 224 mg/dL — ABNORMAL HIGH (ref 65–99)
Potassium: 4 mmol/L (ref 3.5–5.3)
Sodium: 139 mmol/L (ref 135–146)
Total Bilirubin: 0.4 mg/dL (ref 0.2–1.2)
Total Protein: 7.2 g/dL (ref 6.1–8.1)

## 2019-07-30 LAB — CBC WITH DIFFERENTIAL/PLATELET
Absolute Monocytes: 374 cells/uL (ref 200–950)
Basophils Absolute: 48 cells/uL (ref 0–200)
Basophils Relative: 0.7 %
Eosinophils Absolute: 150 cells/uL (ref 15–500)
Eosinophils Relative: 2.2 %
HCT: 47.2 % — ABNORMAL HIGH (ref 35.0–45.0)
Hemoglobin: 15.5 g/dL (ref 11.7–15.5)
Lymphs Abs: 3250 cells/uL (ref 850–3900)
MCH: 29 pg (ref 27.0–33.0)
MCHC: 32.8 g/dL (ref 32.0–36.0)
MCV: 88.2 fL (ref 80.0–100.0)
MPV: 8.9 fL (ref 7.5–12.5)
Monocytes Relative: 5.5 %
Neutro Abs: 2978 cells/uL (ref 1500–7800)
Neutrophils Relative %: 43.8 %
Platelets: 263 10*3/uL (ref 140–400)
RBC: 5.35 10*6/uL — ABNORMAL HIGH (ref 3.80–5.10)
RDW: 13.1 % (ref 11.0–15.0)
Total Lymphocyte: 47.8 %
WBC: 6.8 10*3/uL (ref 3.8–10.8)

## 2019-07-30 LAB — LIPID PANEL
Cholesterol: 255 mg/dL — ABNORMAL HIGH (ref <200)
HDL: 47 mg/dL — ABNORMAL LOW (ref >=50)
LDL Cholesterol (Calc): 153 mg/dL (calc) — ABNORMAL HIGH
Non-HDL Cholesterol (Calc): 208 mg/dL (calc) — ABNORMAL HIGH (ref <130)
Total CHOL/HDL Ratio: 5.4 (calc) — ABNORMAL HIGH (ref <5.0)
Triglycerides: 355 mg/dL — ABNORMAL HIGH (ref <150)

## 2019-07-30 LAB — MAGNESIUM: Magnesium: 1.8 mg/dL (ref 1.5–2.5)

## 2019-07-30 LAB — HEMOGLOBIN A1C
Hgb A1c MFr Bld: 9.5 % of total Hgb — ABNORMAL HIGH (ref <5.7)
Mean Plasma Glucose: 226 (calc)
eAG (mmol/L): 12.5 (calc)

## 2019-07-30 LAB — TSH: TSH: 1.89 mIU/L

## 2019-07-30 LAB — VITAMIN D 25 HYDROXY (VIT D DEFICIENCY, FRACTURES): Vit D, 25-Hydroxy: 27 ng/mL — ABNORMAL LOW (ref 30–100)

## 2019-07-30 MED ORDER — RIVAROXABAN 20 MG PO TABS: 20.0000 mg | ORAL_TABLET | Freq: Every day | ORAL | 11 refills | Status: DC

## 2019-07-30 NOTE — Telephone Encounter (Signed)
Received a transmission from [facility].  Patient had PE, was placed on eliquis from the hospital.  Her insurance prefers xarelto and eliquis is non formulary. Since the patient has been on x 1 month (06/30/2019) will switch patient to xarelto 20mg  daily.

## 2019-07-31 NOTE — Telephone Encounter (Signed)
Patient has been made aware of MyChart message

## 2019-10-19 ENCOUNTER — Other Ambulatory Visit: Payer: Self-pay | Admitting: Physician Assistant

## 2019-10-19 DIAGNOSIS — E1169 Type 2 diabetes mellitus with other specified complication: Secondary | ICD-10-CM

## 2019-10-21 ENCOUNTER — Other Ambulatory Visit: Payer: Self-pay | Admitting: Adult Health

## 2019-10-21 DIAGNOSIS — K2100 Gastro-esophageal reflux disease with esophagitis, without bleeding: Secondary | ICD-10-CM

## 2019-10-29 DIAGNOSIS — E119 Type 2 diabetes mellitus without complications: Secondary | ICD-10-CM | POA: Insufficient documentation

## 2019-10-29 DIAGNOSIS — E1122 Type 2 diabetes mellitus with diabetic chronic kidney disease: Secondary | ICD-10-CM | POA: Insufficient documentation

## 2019-10-29 NOTE — Progress Notes (Signed)
FOLLOW UP  Assessment and Plan:  Diagnoses and all orders for this visit:  Essential hypertension - continue medications, DASH diet, exercise and monitor at home. Call if greater than 130/80.  -     CBC with Diff -     COMPLETE METABOLIC PANEL WITH GFR -     TSH  Type 2 diabetes mellitus with hyperlipidemia (HCC) -     Hemoglobin A1c ([facility]) Discussed general issues about diabetes pathophysiology and management., Educational material distributed., Suggested low cholesterol diet., Encouraged aerobic exercise., Discussed foot care., Reminded to get yearly retinal exam. LONG DISCUSSION, VERY RESISTANT TO MEDICATIONS WILL THINK ABOUT NEXLETROL  Hyperlipidemia, unspecified hyperlipidemia type -     Lipid Profile check lipids- WILL THINK ABOUT NEXLETROL decrease fatty foods increase activity.   Iron deficiency anemia, unspecified iron deficiency anemia type - monitor, continue iron supp with Vitamin C and increase green leafy veggies  Vitamin D deficiency -     Vitamin D (25 hydroxy)  History of pulmonary embolus (PE) CONTINUE MEDICATIONS  CKD stage 3 due to type 2 diabetes mellitus (HCC) Increase fluids, avoid NSAIDS, monitor sugars, will monitor  Type 2 diabetes mellitus with stage 3 chronic kidney disease, without long-term current use of insulin, unspecified whether stage 3a or 3b CKD ([place]) Discussed general issues about diabetes pathophysiology and management., Educational material distributed., Suggested low cholesterol diet., Encouraged aerobic exercise., Discussed foot care., Reminded to get yearly retinal exam.  Medication management -     Magnesium  Acute recurrent maxillary sinusitis -     doxycycline (VIBRAMYCIN) 100 MG capsule; Take 1 capsule twice daily with food    Continue diet and meds as discussed. Further disposition pending results of labs. Discussed med's effects and SE's.   Over 30 minutes of exam, counseling, chart review, and critical decision  making was performed.   Future Appointments  Date Time Provider [place]  02/09/2020  3:00 PM Vicie Mutters, PA-C [facility] None    ----------------------------------------------------------------------------------------------------------------------  HPI 53 y.o. female  presents for 3 month follow up on hypertension, cholesterol, diabetes, morbid obesity and vitamin D deficiency.   Senior in the house, and that has been very depressing but she is starting [employer] 09/07.   She had sinus congestion x 2-3 weeks, sinus pressure, has teeth pain.   Had bilateral PE 06/30/2019, on Eliquis now, still having some fatigue but states she is doing better.   BMI is Body mass index is 37.88 kg/m., she is working on diet and exercise; she is working from home and states she is not walking as much still.  Wt Readings from Last 3 Encounters:  10/30/19 264 lb (119.7 kg)  07/29/19 268 lb (121.6 kg)  07/08/19 264 lb (119.7 kg)   She has not been checkiing BP at home, today their BP is BP: 120/64  She does workout. She denies chest pain, shortness of breath, dizziness.   She has been working on diet and exercise for T2 diabetes With hyperlipidemia not at goal of less than 70- declines statins, has tried crestor in the past  she is very resistant to starting on medications switched from [place] to jardiance due to nasopharyngitis symptoms and states she is not having any issues   metformin not on it due to recalls denies foot ulcerations, increased appetite, nausea, paresthesia of the feet, polydipsia, polyuria, visual disturbances, vomiting and weight loss.  She has a meter at home but has not been checking.  Last A1C in the office  was:  Lab Results  Component Value Date   HGBA1C 8.6 (H) 10/30/2019   Lab Results  Component Value Date   GFRNONAA 62 10/30/2019   Lab Results  Component Value Date   CHOL 233 (H) 10/30/2019   HDL 50 10/30/2019   LDLCALC 145 (H)  10/30/2019   TRIG 238 (H) 10/30/2019   CHOLHDL 4.7 10/30/2019    Patient is not on Vitamin D supplement.   Lab Results  Component Value Date   VD25OH 37 10/30/2019       Current Medications:  Current Outpatient Medications on File Prior to Visit  Medication Sig  . acetaminophen (TYLENOL) 500 MG tablet Take 1,000 mg by mouth every 6 (six) hours as needed for mild pain.  . cetirizine (ZYRTEC) 10 MG tablet Take 10 mg by mouth daily.  . Cholecalciferol 5000 units capsule Take 5,000 Units by mouth daily.  . empagliflozin (JARDIANCE) 25 MG TABS tablet Take 1 tablet Daily for DiabetesT  . famotidine (PEPCID) 20 MG tablet Take 1 tablet 2 x /day as needed for Acid Indigestion & Reflux  . lisinopril (ZESTRIL) 20 MG tablet TAKE 1 TABLET BY MOUTH EVERY DAY  . rivaroxaban (XARELTO) 20 MG TABS tablet Take 1 tablet (20 mg total) by mouth daily with supper.   No current facility-administered medications on file prior to visit.      Allergies:  Allergies  Allergen Reactions  . Farxiga [Dapagliflozin] Anaphylaxis  . Crestor [Rosuvastatin]     Hair Loss   . Penicillins Rash    Did it involve swelling of the face/tongue/throat, SOB, or low BP? No Did it involve sudden or severe rash/hives, skin peeling, or any reaction on the inside of your mouth or nose? No Did you need to seek medical attention at a hospital or doctor's office? No When did it last happen? If all above answers are "NO", may proceed with cephalosporin use.     Medical History:  Past Medical History:  Diagnosis Date  . Fibrocystic breast   . Hyperlipidemia   . Hypertension   . Hypothyroidism   . Iron deficiency anemia   . Obesity    BMI 41  . Prediabetes   . Pulmonary embolism ([place]) 2006   from BCP  . Vitamin D deficiency    Family history- Reviewed and unchanged Social history- Reviewed and unchanged   Review of Systems:  Review of Systems  Constitutional: Negative for chills, fever, malaise/fatigue  and weight loss.  HENT: Negative for congestion, ear discharge, hearing loss, nosebleeds, sinus pain, sore throat and tinnitus.   Eyes: Negative for blurred vision and double vision.  Respiratory: Negative for cough, hemoptysis, sputum production, shortness of breath, wheezing and stridor.   Cardiovascular: Negative for chest pain, palpitations, orthopnea, claudication, leg swelling and PND.  Gastrointestinal: Negative for abdominal pain, blood in stool, constipation, diarrhea, heartburn, melena, nausea and vomiting.  Genitourinary: Negative.   Musculoskeletal: Negative for joint pain and myalgias.  Skin: Negative for rash.  Neurological: Negative for dizziness, tingling, sensory change, weakness and headaches.  Endo/Heme/Allergies: Negative for polydipsia.  Psychiatric/Behavioral: Negative.   All other systems reviewed and are negative.   Physical Exam: BP 120/64   Pulse 92   Temp 97.7 F (36.5 C) (Temporal)   Resp 14   Ht 5\' 10"  (1.778 m)   Wt 264 lb (119.7 kg)   BMI 37.88 kg/m  Wt Readings from Last 3 Encounters:  10/30/19 264 lb (119.7 kg)  07/29/19 268 lb (121.6 kg)  07/08/19 264 lb (119.7 kg)   General Appearance: Well nourished, in no apparent distress. Eyes: PERRLA, EOMs, conjunctiva no swelling or erythema Sinuses: No Frontal/maxillary tenderness ENT/Mouth: Ext aud canals clear, TMs without erythema, bulging. No erythema, swelling, or exudate on post pharynx.  Tonsils not swollen or erythematous. Hearing normal.  Neck: Supple, thyroid normal.  Respiratory: Respiratory effort normal, BS equal bilaterally without rales, rhonchi, wheezing or stridor.  Cardio: RRR with no MRGs. Brisk peripheral pulses without edema.  Abdomen: Soft, + BS.  Non tender, no guarding, rebound, hernias, masses. Lymphatics: Non tender without lymphadenopathy.  Musculoskeletal: Full ROM, 5/5 strength, Normal gait Skin: Warm, dry without rashes, lesions, ecchymosis.  Neuro: Cranial nerves intact.  No cerebellar symptoms.  Psych: Awake and oriented X 3, normal affect, Insight and Judgment appropriate.    Vicie Mutters, PA-C 12:22 PM [facility]

## 2019-10-30 ENCOUNTER — Ambulatory Visit (INDEPENDENT_AMBULATORY_CARE_PROVIDER_SITE_OTHER): Payer: 59 | Admitting: Physician Assistant

## 2019-10-30 ENCOUNTER — Other Ambulatory Visit: Payer: Self-pay

## 2019-10-30 ENCOUNTER — Encounter: Payer: Self-pay | Admitting: Physician Assistant

## 2019-10-30 VITALS — BP 120/64 | HR 92 | Temp 97.7°F | Resp 14 | Ht 70.0 in | Wt 264.0 lb

## 2019-10-30 DIAGNOSIS — J0101 Acute recurrent maxillary sinusitis: Secondary | ICD-10-CM

## 2019-10-30 DIAGNOSIS — E1169 Type 2 diabetes mellitus with other specified complication: Secondary | ICD-10-CM | POA: Diagnosis not present

## 2019-10-30 DIAGNOSIS — D509 Iron deficiency anemia, unspecified: Secondary | ICD-10-CM | POA: Diagnosis not present

## 2019-10-30 DIAGNOSIS — E785 Hyperlipidemia, unspecified: Secondary | ICD-10-CM

## 2019-10-30 DIAGNOSIS — N183 Chronic kidney disease, stage 3 unspecified: Secondary | ICD-10-CM

## 2019-10-30 DIAGNOSIS — Z86711 Personal history of pulmonary embolism: Secondary | ICD-10-CM

## 2019-10-30 DIAGNOSIS — I10 Essential (primary) hypertension: Secondary | ICD-10-CM

## 2019-10-30 DIAGNOSIS — E1122 Type 2 diabetes mellitus with diabetic chronic kidney disease: Secondary | ICD-10-CM

## 2019-10-30 DIAGNOSIS — Z79899 Other long term (current) drug therapy: Secondary | ICD-10-CM

## 2019-10-30 DIAGNOSIS — E559 Vitamin D deficiency, unspecified: Secondary | ICD-10-CM

## 2019-10-30 MED ORDER — DOXYCYCLINE HYCLATE 100 MG PO CAPS: ORAL_CAPSULE | ORAL | 0 refills | Status: DC

## 2019-10-30 NOTE — Patient Instructions (Addendum)
Check out Nexlitrol  I'm going to place an order for a cardiac calcium score, this will help [place] quantify how much calcification if any you have in your coronary arteries and can help [place] decide if we need to do aggressive medical management like cholesterol medication.  It will be done at [facility] on [address] in [place] It will be 99 dollars self pay.  You can call [phone] to schedule this, just given them your name and date of birth.    General eating tips  What to Avoid . Avoid added sugars o Often added sugar can be found in processed foods such as many condiments, dry cereals, cakes, cookies, chips, crisps, crackers, candies, sweetened drinks, etc.  o Read labels and AVOID/DECREASE use of foods with the following in their ingredient list: Sugar, fructose, high fructose corn syrup, sucrose, glucose, maltose, dextrose, molasses, cane sugar, brown sugar, any type of syrup, agave nectar, etc.   . Avoid snacking in between meals- drink water or if you feel you need a snack, pick a high water content snack such as cucumbers, watermelon, or any veggie.  Marland Kitchen Avoid foods made with flour o If you are going to eat food made with flour, choose those made with whole-grains; and, minimize your consumption as much as is tolerable . Avoid processed foods o These foods are generally stocked in the middle of the grocery store.  o Focus on shopping on the perimeter of the grocery.  What to Include . Vegetables o GREEN LEAFY VEGETABLES: Kale, spinach, mustard greens, collard greens, cabbage, broccoli, etc. o OTHER: Asparagus, cauliflower, eggplant, carrots, peas, Brussel sprouts, tomatoes, bell peppers, zucchini, beets, cucumbers, etc. . Grains, seeds, and legumes o Beans: kidney beans, black eyed peas, garbanzo beans, black beans, pinto beans, etc. o Whole, unrefined grains: brown rice, barley, bulgur, oatmeal, etc. . Healthy fats  o Avoid highly processed fats such as vegetable oil o Examples of  healthy fats: avocado, olives, virgin olive oil, dark chocolate (?72% Cocoa), nuts (peanuts, almonds, walnuts, cashews, pecans, etc.) o Please still do small amount of these healthy fats, they are dense in calories.  . Low - Moderate Intake of Animal Sources of Protein o Meat sources: chicken, [place], salmon, tuna. Limit to 4 ounces of meat at one time or the size of your palm. o Consider limiting dairy sources, but when choosing dairy focus on: PLAIN [place] yogurt, cottage cheese, high-protein milk . Fruit o Choose berries

## 2019-10-31 ENCOUNTER — Other Ambulatory Visit: Payer: Self-pay | Admitting: Physician Assistant

## 2019-10-31 LAB — COMPLETE METABOLIC PANEL WITH GFR
AG Ratio: 1.4 (calc) (ref 1.0–2.5)
ALT: 33 U/L — ABNORMAL HIGH (ref 6–29)
AST: 29 U/L (ref 10–35)
Albumin: 4.2 g/dL (ref 3.6–5.1)
Alkaline phosphatase (APISO): 41 U/L (ref 37–153)
BUN: 17 mg/dL (ref 7–25)
CO2: 26 mmol/L (ref 20–32)
Calcium: 9.5 mg/dL (ref 8.6–10.4)
Chloride: 102 mmol/L (ref 98–110)
Creat: 1.02 mg/dL (ref 0.50–1.05)
GFR, Est African American: 72 mL/min/{1.73_m2} (ref >=60)
GFR, Est Non African American: 62 mL/min/{1.73_m2} (ref >=60)
Globulin: 2.9 g/dL (calc) (ref 1.9–3.7)
Glucose, Bld: 212 mg/dL — ABNORMAL HIGH (ref 65–99)
Potassium: 3.9 mmol/L (ref 3.5–5.3)
Sodium: 139 mmol/L (ref 135–146)
Total Bilirubin: 0.5 mg/dL (ref 0.2–1.2)
Total Protein: 7.1 g/dL (ref 6.1–8.1)

## 2019-10-31 LAB — HEMOGLOBIN A1C
Hgb A1c MFr Bld: 8.6 % of total Hgb — ABNORMAL HIGH (ref <5.7)
Mean Plasma Glucose: 200 (calc)
eAG (mmol/L): 11.1 (calc)

## 2019-10-31 LAB — CBC WITH DIFFERENTIAL/PLATELET
Absolute Monocytes: 427 cells/uL (ref 200–950)
Basophils Absolute: 79 cells/uL (ref 0–200)
Basophils Relative: 1 %
Eosinophils Absolute: 198 cells/uL (ref 15–500)
Eosinophils Relative: 2.5 %
HCT: 46.7 % — ABNORMAL HIGH (ref 35.0–45.0)
Hemoglobin: 15.8 g/dL — ABNORMAL HIGH (ref 11.7–15.5)
Lymphs Abs: 3460 cells/uL (ref 850–3900)
MCH: 29.9 pg (ref 27.0–33.0)
MCHC: 33.8 g/dL (ref 32.0–36.0)
MCV: 88.3 fL (ref 80.0–100.0)
MPV: 9.1 fL (ref 7.5–12.5)
Monocytes Relative: 5.4 %
Neutro Abs: 3737 cells/uL (ref 1500–7800)
Neutrophils Relative %: 47.3 %
Platelets: 281 10*3/uL (ref 140–400)
RBC: 5.29 10*6/uL — ABNORMAL HIGH (ref 3.80–5.10)
RDW: 13.4 % (ref 11.0–15.0)
Total Lymphocyte: 43.8 %
WBC: 7.9 10*3/uL (ref 3.8–10.8)

## 2019-10-31 LAB — TSH: TSH: 2.43 mIU/L

## 2019-10-31 LAB — MAGNESIUM: Magnesium: 1.8 mg/dL (ref 1.5–2.5)

## 2019-10-31 LAB — LIPID PANEL
Cholesterol: 233 mg/dL — ABNORMAL HIGH (ref <200)
HDL: 50 mg/dL (ref >=50)
LDL Cholesterol (Calc): 145 mg/dL (calc) — ABNORMAL HIGH
Non-HDL Cholesterol (Calc): 183 mg/dL (calc) — ABNORMAL HIGH (ref <130)
Total CHOL/HDL Ratio: 4.7 (calc) (ref <5.0)
Triglycerides: 238 mg/dL — ABNORMAL HIGH (ref <150)

## 2019-10-31 LAB — VITAMIN D 25 HYDROXY (VIT D DEFICIENCY, FRACTURES): Vit D, 25-Hydroxy: 37 ng/mL (ref 30–100)

## 2019-10-31 MED ORDER — METFORMIN HCL ER 750 MG PO TB24: 750.0000 mg | ORAL_TABLET | Freq: Two times a day (BID) | ORAL | 1 refills | Status: DC

## 2020-01-05 ENCOUNTER — Encounter: Payer: Self-pay | Admitting: Physician Assistant

## 2020-01-12 ENCOUNTER — Other Ambulatory Visit: Payer: Self-pay | Admitting: Internal Medicine

## 2020-01-12 DIAGNOSIS — E785 Hyperlipidemia, unspecified: Secondary | ICD-10-CM

## 2020-01-12 DIAGNOSIS — E1169 Type 2 diabetes mellitus with other specified complication: Secondary | ICD-10-CM

## 2020-02-09 ENCOUNTER — Encounter: Payer: Self-pay | Admitting: Physician Assistant

## 2020-02-23 ENCOUNTER — Other Ambulatory Visit: Payer: Self-pay | Admitting: Physician Assistant

## 2020-03-03 NOTE — Progress Notes (Signed)
Complete Physical  Assessment and Plan: Essential hypertension - continue medications, DASH diet, exercise and monitor at home. Call if greater than 130/80.  - CBC with Differential/Platelet - BASIC METABOLIC PANEL WITH GFR - Hepatic function panel   Hyperlipidemia  check lipids, decrease fatty foods, increase activity.  - Lipid panel  Type 2 diabetes mellitus with hyperlipidemia (HCC) -     Hemoglobin A1c -     EKG 12-Lead - declines chol medication Patient very motivated to be healthier - may do glyambi versus adding on GLP for weight loss  CKD stage 3 due to type 2 diabetes mellitus (HCC) -     Hemoglobin A1c -     EKG 12-Lead - continue medications Increase water  Type 2 diabetes mellitus with stage 3 chronic kidney disease, without long-term current use of insulin, unspecified whether stage 3a or 3b CKD (HCC) -     Hemoglobin A1c -     EKG 12-Lead Discussed general issues about diabetes pathophysiology and management., Educational material distributed., Suggested low cholesterol diet., Encouraged aerobic exercise., Discussed foot care., Reminded to get yearly retinal exam.  Iron deficiency anemia, unspecified iron deficiency anemia type -     Iron,Total/Total Iron Binding Cap -     Vitamin B12  History of pulmonary embolus (PE) Continue xarelto, increase walking, weight loss advised, get colonoscopy  Screen for colon cancer -     Ambulatory referral to Gastroenterology - has never had, willing to do at this time  Hypothyroidism, unspecified hypothyroidism type Off medication, will monitor labs.  - TSH  Morbid obesity, unspecified obesity type ([place]) Obesity with co morbidities- long discussion about weight loss, diet, and exercise   Vitamin D deficiency - Vit D  25 hydroxy (rtn osteoporosis monitoring)  Fibrocystic breast, unspecified laterality GET MGM, will follow up with Dr. Philis Pique  Encounter for general adult medical examination with abnormal  findings Get MGM from GYN  BMI 37.0-37.9, adult Morbid Obesity with co morbidities - long discussion about weight loss, diet, and exercise  Right ear pain No infection, possible effusion, get on nasal spray ? TMJ- information given.   Discussed med's effects and SE's. Screening labs and tests as requested with regular follow-up as recommended. Future Appointments  Date Time Provider [place]  02/09/2021  3:00 PM Vicie Mutters, PA-C [facility] None    HPI  54 y.o. female  presents for a complete physical.  She was treated for sinus infection 10/2019. She is complaining of right ear pain x 1 week with nausea. No fever, no chills. Has been on allergy pill, not on nasal spray.   Had bilateral PE 06/30/2019, on Eliquis now, still having some fatigue but states she is doing better.   Her blood pressure has been controlled at home, today their BP is BP: 128/76  She does not workout, she admits that with working at home and the weather she is not working out. She denies chest pain, shortness of breath, dizziness.  BMI is Body mass index is 40.9 kg/m., she is working on diet and exercise. working at [employer].  Wt Readings from Last 3 Encounters:  03/05/20 269 lb (122 kg)  10/30/19 264 lb (119.7 kg)  07/29/19 268 lb (121.6 kg)   She is VERY resistant to medications but we had a VERY serious talking about consequences of blindness, kidney failure, stroke, MI, loss of limb if these numbers are not improved.   She is not on cholesterol medication ,she states with crestor  she had hair falling out. Her cholesterol is at goal. The cholesterol last visit was:  Lab Results  Component Value Date   CHOL 233 (H) 10/30/2019   HDL 50 10/30/2019   LDLCALC 145 (H) 10/30/2019   TRIG 238 (H) 10/30/2019   CHOLHDL 4.7 10/30/2019    She has been working on diet and exercise for diabetes she is not on ASA  she is on ACE/lisinopril she can not tolerate farxiga due to  nasopharyngitis symptoms - she is on jardiance 25mg  and tolerating.   just on 750mg  metformin a day- takes [facility]- due to working at home and upset stomach.  NOT checking her sugars at home but has a monitor from work   and denies paresthesia of the feet, polydipsia, polyuria and visual disturbances. Has a meter at home, not checking.   Last A1C in the office was: Lab Results  Component Value Date   HGBA1C 8.6 (H) 10/30/2019   Lab Results  Component Value Date   GFRNONAA 62 10/30/2019   Patient is on Vitamin D supplement, 5000 a day but not consistenly  Lab Results  Component Value Date   VD25OH 37 10/30/2019   She is not on thyroid medication. Her medication was not changed last visit.   Lab Results  Component Value Date   TSH 2.43 10/30/2019   Current Medications:  Current Outpatient Medications on File Prior to Visit  Medication Sig  . acetaminophen (TYLENOL) 500 MG tablet Take 1,000 mg by mouth every 6 (six) hours as needed for mild pain.  . cetirizine (ZYRTEC) 10 MG tablet Take 10 mg by mouth daily.  . Cholecalciferol 5000 units capsule Take 5,000 Units by mouth daily.  . famotidine (PEPCID) 20 MG tablet Take 1 tablet 2 x /day as needed for Acid Indigestion & Reflux  . JARDIANCE 25 MG TABS tablet TAKE 1 TABLET DAILY FOR DIABETEST  . lisinopril (ZESTRIL) 20 MG tablet TAKE 1 TABLET BY MOUTH EVERY DAY  . metFORMIN (GLUCOPHAGE XR) 750 MG 24 hr tablet Take 1 tablet (750 mg total) by mouth 2 (two) times daily with a meal.  . rivaroxaban (XARELTO) 20 MG TABS tablet Take 1 tablet (20 mg total) by mouth daily with supper.   No current facility-administered medications on file prior to visit.    Health Maintenance:   Immunization History  Administered Date(s) Administered  . Pneumococcal-Unspecified 12/12/2003  . Tdap 06/10/2012   Tetanus: 2013 Pneumovax: 2005 Prevnar 13: NA Flu vaccine: declines Zostavax: NA  LMP: No LMP recorded. Patient is postmenopausal.  Pap: Dec 2020  follows with Philis Pique normal, neg HPV MGM: Dec 2020 Dr. Philis Pique  DEXA: N/A Colonoscopy: DUE  EGD:  N/A Echo 06/2019 CTA 06/2019 [place] AB 2017 Last Dental Exam: Dental works Last Eye Exam: 5 years ago but states no issues at this time NEEDS  Patient Care Team: Unk Pinto, MD as PCP - General (Internal Medicine)  Medical History:  Past Medical History:  Diagnosis Date  . Fibrocystic breast   . Hyperlipidemia   . Hypertension   . Hypothyroidism   . Iron deficiency anemia   . Obesity    BMI 41  . Prediabetes   . Pulmonary embolism ([place]) 2006   from BCP  . Vitamin D deficiency    Allergies Allergies  Allergen Reactions  . Farxiga [Dapagliflozin] Anaphylaxis  . Crestor [Rosuvastatin]     Hair Loss   . Penicillins Rash    Did it involve swelling of the face/tongue/throat, SOB, or  low BP? No Did it involve sudden or severe rash/hives, skin peeling, or any reaction on the inside of your mouth or nose? No Did you need to seek medical attention at a hospital or doctor's office? No When did it last happen? If all above answers are "NO", may proceed with cephalosporin use.    SURGICAL HISTORY She  has a past surgical history that includes Tubal ligation (Bilateral); Tonsillectomy and adenoidectomy; Ablation (2006); and Thyroid surgery.   FAMILY HISTORY Her family history includes Cancer in her mother; Diabetes in her father; Osteoporosis in her mother.  SOCIAL HISTORY She  reports that she has never smoked. She has never used smokeless tobacco. She reports that she does not drink alcohol or use drugs.  Review of Systems  Constitutional: Positive for malaise/fatigue. Negative for chills, diaphoresis, fever and weight loss.  HENT: Negative.        + snoring, declines sleep study at this time due to cost  Eyes: Negative.  Negative for blurred vision.  Respiratory: Negative.  Negative for cough, hemoptysis, shortness of breath and wheezing.   Cardiovascular: Negative  for chest pain, palpitations, orthopnea, claudication, leg swelling and PND.  Gastrointestinal: Positive for heartburn (depending on what she eats). Negative for abdominal pain, blood in stool, constipation, diarrhea, melena, nausea and vomiting.  Genitourinary: Positive for frequency (on prednisone). Negative for dysuria, flank pain, hematuria and urgency.  Musculoskeletal: Positive for myalgias (varicose veins bilateral legs). Negative for falls.  Skin: Negative.   Neurological: Negative.  Negative for weakness.  Psychiatric/Behavioral: Negative.  Negative for depression and suicidal ideas. The patient does not have insomnia.     Physical Exam: Estimated body mass index is 40.9 kg/m as calculated from the following:   Height as of this encounter: 5\' 8"  (1.727 m).   Weight as of this encounter: 269 lb (122 kg). BP 128/76   Pulse 80   Temp 97.7 F (36.5 C)   Ht 5\' 8"  (1.727 m)   Wt 269 lb (122 kg)   SpO2 98%   BMI 40.90 kg/m  General Appearance: Well nourished, in no apparent distress.  Eyes: PERRLA, EOMs, conjunctiva no swelling or erythema, normal fundi and vessels.  Sinuses: No Frontal/maxillary tenderness  ENT/Mouth: Ext aud canals clear, normal light reflex with TMs without erythema, bulging. Good dentition. No erythema, swelling, or exudate on post pharynx. Tonsils not swollen or erythematous. Hearing normal.  Neck: Supple, thyroid normal. No bruits  Respiratory: Respiratory effort normal, BS equal bilaterally without rales, rhonchi, wheezing or stridor.  Cardio: RRR without murmurs, rubs or gallops. Brisk peripheral pulses without edema.  Chest: symmetric, with normal excursions and percussion.  Breasts: defer Abdomen: Soft, nontender, obese, mild hepatomegaly,  no guarding, rebound, hernias, masses, or organomegaly.  Lymphatics: Non tender without lymphadenopathy.  Genitourinary: defer Musculoskeletal: Full ROM all peripheral extremities,5/5 strength, and normal gait.   Skin: Left chest with scaly erythematous nodule. Warm, dry without rashes, lesions, ecchymosis. Neuro: Cranial nerves intact, reflexes equal bilaterally. Normal muscle tone, no cerebellar symptoms. Sensation intact.  Psych: Awake and oriented X 3, normal affect, Insight and Judgment appropriate.   EKG: WNL no changes.   Vicie Mutters 9:50 AM [facility]

## 2020-03-05 ENCOUNTER — Ambulatory Visit (INDEPENDENT_AMBULATORY_CARE_PROVIDER_SITE_OTHER): Payer: 59 | Admitting: Physician Assistant

## 2020-03-05 ENCOUNTER — Encounter: Payer: Self-pay | Admitting: Physician Assistant

## 2020-03-05 ENCOUNTER — Other Ambulatory Visit: Payer: Self-pay

## 2020-03-05 VITALS — BP 128/76 | HR 80 | Temp 97.7°F | Ht 68.0 in | Wt 269.0 lb

## 2020-03-05 DIAGNOSIS — Z0001 Encounter for general adult medical examination with abnormal findings: Secondary | ICD-10-CM

## 2020-03-05 DIAGNOSIS — Z1211 Encounter for screening for malignant neoplasm of colon: Secondary | ICD-10-CM

## 2020-03-05 DIAGNOSIS — N183 Chronic kidney disease, stage 3 unspecified: Secondary | ICD-10-CM

## 2020-03-05 DIAGNOSIS — D509 Iron deficiency anemia, unspecified: Secondary | ICD-10-CM

## 2020-03-05 DIAGNOSIS — Z0000 Encounter for general adult medical examination without abnormal findings: Secondary | ICD-10-CM | POA: Diagnosis not present

## 2020-03-05 DIAGNOSIS — E1122 Type 2 diabetes mellitus with diabetic chronic kidney disease: Secondary | ICD-10-CM

## 2020-03-05 DIAGNOSIS — E785 Hyperlipidemia, unspecified: Secondary | ICD-10-CM

## 2020-03-05 DIAGNOSIS — I10 Essential (primary) hypertension: Secondary | ICD-10-CM

## 2020-03-05 DIAGNOSIS — E1169 Type 2 diabetes mellitus with other specified complication: Secondary | ICD-10-CM

## 2020-03-05 DIAGNOSIS — N6019 Diffuse cystic mastopathy of unspecified breast: Secondary | ICD-10-CM

## 2020-03-05 DIAGNOSIS — E669 Obesity, unspecified: Secondary | ICD-10-CM

## 2020-03-05 DIAGNOSIS — Z79899 Other long term (current) drug therapy: Secondary | ICD-10-CM

## 2020-03-05 DIAGNOSIS — Z136 Encounter for screening for cardiovascular disorders: Secondary | ICD-10-CM | POA: Diagnosis not present

## 2020-03-05 DIAGNOSIS — E559 Vitamin D deficiency, unspecified: Secondary | ICD-10-CM

## 2020-03-05 DIAGNOSIS — Z86711 Personal history of pulmonary embolism: Secondary | ICD-10-CM

## 2020-03-05 NOTE — Patient Instructions (Addendum)
Your ears and sinuses are connected by the eustachian tube. When your sinuses are inflamed, this can close off the tube and cause fluid to collect in your middle ear. This can then cause dizziness, popping, clicking, ringing, and echoing in your ears. This is often NOT an infection and does NOT require antibiotics, it is caused by inflammation so the treatments help the inflammation. This can take a long time to get better so please be patient.  Here are things you can do to help with this: - Try the Flonase or Nasonex. Remember to spray each nostril twice towards the outer part of your eye.  Do not sniff but instead pinch your nose and tilt your head back to help the medicine get into your sinuses.  The best time to do this is at bedtime.Stop if you get blurred vision or nose bleeds.  -While drinking fluids, pinch and hold nose close and swallow, to help open eustachian tubes to drain fluid behind ear drums. -Please pick one of the over the counter allergy medications below and take it once daily for allergies.  It will also help with fluid behind ear drums. Claritin or loratadine cheapest but likely the weakest  Zyrtec or certizine at night because it can make you sleepy The strongest is allegra or fexafinadine  Cheapest at [employer], [employer], [employer] -can use decongestant over the counter, please do not use if you have high blood pressure or certain heart conditions.   if worsening HA, changes vision/speech, imbalance, weakness go to the ER   What is the TMJ? The temporomandibular (tem-PUH-ro-man-DIB-yoo-ler) joint, or the TMJ, connects the upper and lower jawbones. This joint allows the jaw to open wide and move back and forth when you chew, talk, or yawn.There are also several muscles that help this joint move. There can be muscle tightness and pain in the muscle that can cause several symptoms.  What causes TMJ pain? There are many causes of TMJ pain. Repeated chewing (for example, chewing gum)  and clenching your teeth can cause pain in the joint. Some TMJ pain has no obvious cause. What can I do to ease the pain? There are many things you can do to help your pain get better. When you have pain:  Eat soft foods and stay away from chewy foods (for example, taffy) Try to use both sides of your mouth to chew Don't chew gum Massage Don't open your mouth wide (for example, during yawning or singing) Don't bite your cheeks or fingernails Lower your amount of stress and worry Applying a warm, damp washcloth to the joint may help. Over-the-counter pain medicines such as ibuprofen (one brand: Advil) or acetaminophen (one brand: Tylenol) might also help. Do not use these medicines if you are allergic to them or if your doctor told you not to use them. How can I stop the pain from coming back? When your pain is better, you can do these exercises to make your muscles stronger and to keep the pain from coming back:  Resisted mouth opening: Place your thumb or two fingers under your chin and open your mouth slowly, pushing up lightly on your chin with your thumb. Hold for three to six seconds. Close your mouth slowly. Resisted mouth closing: Place your thumbs under your chin and your two index fingers on the ridge between your mouth and the bottom of your chin. Push down lightly on your chin as you close your mouth. Tongue up: Slowly open and close your mouth while   keeping the tongue touching the roof of the mouth. Side-to-side jaw movement: Place an object about one fourth of an inch thick (for example, two tongue depressors) between your front teeth. Slowly move your jaw from side to side. Increase the thickness of the object as the exercise becomes easier Forward jaw movement: Place an object about one fourth of an inch thick between your front teeth and move the bottom jaw forward so that the bottom teeth are in front of the top teeth. Increase the thickness of the object as the exercise becomes  easier. These exercises should not be painful. If it hurts to do these exercises, stop doing them and talk to your family doctor.  RANGE OF A1C   Your A1C is a measure of your sugar over the past 3 months and is not affected by what you have eaten over the past few days. Diabetes increases your chances of stroke and heart attack over 300 % and is the leading cause of blindness and kidney failure in the [place]. Please make sure you decrease bad carbs like white bread, white rice, potatoes, corn, soft drinks, pasta, cereals, refined sugars, sweet tea, dried fruits, and fruit juice. Good carbs are okay to eat in moderation like sweet potatoes, brown rice, whole grain pasta/bread, most fruit (except dried fruit) and you can eat as many veggies as you want.   Greater than 6.5 is considered diabetic. Between 6.4 and 5.7 is prediabetic If your A1C is less than 5.7 you are NOT diabetic.  Targets for Glucose Readings: Time of Check Target for patients WITHOUT Diabetes Target for DIABETICS  Before Meals Less than 100  less than 150  Two hours after meals Less than 200  Less than 250       Bad carbs also include fruit juice, alcohol, and sweet tea. These are empty calories that do not signal to your brain that you are full.   Please remember the good carbs are still carbs which convert into sugar. So please measure them out no more than 1/2-1 cup of rice, oatmeal, pasta, and beans  Veggies are however free foods! Pile them on.   Not all fruit is created equal. Please see the list below, the fruit at the bottom is higher in sugars than the fruit at the top. Please avoid all dried fruits.

## 2020-03-06 LAB — COMPLETE METABOLIC PANEL WITH GFR
AG Ratio: 1.4 (calc) (ref 1.0–2.5)
ALT: 43 U/L — ABNORMAL HIGH (ref 6–29)
AST: 38 U/L — ABNORMAL HIGH (ref 10–35)
Albumin: 4.3 g/dL (ref 3.6–5.1)
Alkaline phosphatase (APISO): 51 U/L (ref 37–153)
BUN: 16 mg/dL (ref 7–25)
CO2: 28 mmol/L (ref 20–32)
Calcium: 9.8 mg/dL (ref 8.6–10.4)
Chloride: 99 mmol/L (ref 98–110)
Creat: 0.99 mg/dL (ref 0.50–1.05)
GFR, Est African American: 74 mL/min/{1.73_m2} (ref >=60)
GFR, Est Non African American: 64 mL/min/{1.73_m2} (ref >=60)
Globulin: 3.1 g/dL (calc) (ref 1.9–3.7)
Glucose, Bld: 177 mg/dL — ABNORMAL HIGH (ref 65–99)
Potassium: 4.3 mmol/L (ref 3.5–5.3)
Sodium: 136 mmol/L (ref 135–146)
Total Bilirubin: 0.6 mg/dL (ref 0.2–1.2)
Total Protein: 7.4 g/dL (ref 6.1–8.1)

## 2020-03-06 LAB — URINALYSIS, ROUTINE W REFLEX MICROSCOPIC
Bilirubin Urine: NEGATIVE
Hgb urine dipstick: NEGATIVE
Ketones, ur: NEGATIVE
Leukocytes,Ua: NEGATIVE
Nitrite: NEGATIVE
Protein, ur: NEGATIVE
Specific Gravity, Urine: 1.033 (ref 1.001–1.03)
pH: <=5 (ref 5.0–8.0)

## 2020-03-06 LAB — CBC WITH DIFFERENTIAL/PLATELET
Absolute Monocytes: 552 cells/uL (ref 200–950)
Basophils Absolute: 74 cells/uL (ref 0–200)
Basophils Relative: 0.8 %
Eosinophils Absolute: 129 cells/uL (ref 15–500)
Eosinophils Relative: 1.4 %
HCT: 47.9 % — ABNORMAL HIGH (ref 35.0–45.0)
Hemoglobin: 16.3 g/dL — ABNORMAL HIGH (ref 11.7–15.5)
Lymphs Abs: 4140 cells/uL — ABNORMAL HIGH (ref 850–3900)
MCH: 30.9 pg (ref 27.0–33.0)
MCHC: 34 g/dL (ref 32.0–36.0)
MCV: 90.9 fL (ref 80.0–100.0)
MPV: 8.8 fL (ref 7.5–12.5)
Monocytes Relative: 6 %
Neutro Abs: 4306 cells/uL (ref 1500–7800)
Neutrophils Relative %: 46.8 %
Platelets: 288 10*3/uL (ref 140–400)
RBC: 5.27 10*6/uL — ABNORMAL HIGH (ref 3.80–5.10)
RDW: 12.3 % (ref 11.0–15.0)
Total Lymphocyte: 45 %
WBC: 9.2 10*3/uL (ref 3.8–10.8)

## 2020-03-06 LAB — MAGNESIUM: Magnesium: 2 mg/dL (ref 1.5–2.5)

## 2020-03-06 LAB — VITAMIN B12: Vitamin B-12: 430 pg/mL (ref 200–1100)

## 2020-03-06 LAB — IRON, TOTAL/TOTAL IRON BINDING CAP
%SAT: 21 % (calc) (ref 16–45)
Iron: 90 ug/dL (ref 45–160)
TIBC: 433 mcg/dL (calc) (ref 250–450)

## 2020-03-06 LAB — HEMOGLOBIN A1C
Hgb A1c MFr Bld: 9.6 % of total Hgb — ABNORMAL HIGH (ref <5.7)
Mean Plasma Glucose: 229 (calc)
eAG (mmol/L): 12.7 (calc)

## 2020-03-06 LAB — LIPID PANEL
Cholesterol: 274 mg/dL — ABNORMAL HIGH (ref <200)
HDL: 48 mg/dL — ABNORMAL LOW (ref >=50)
LDL Cholesterol (Calc): 183 mg/dL (calc) — ABNORMAL HIGH
Non-HDL Cholesterol (Calc): 226 mg/dL (calc) — ABNORMAL HIGH (ref <130)
Total CHOL/HDL Ratio: 5.7 (calc) — ABNORMAL HIGH (ref <5.0)
Triglycerides: 233 mg/dL — ABNORMAL HIGH (ref <150)

## 2020-03-06 LAB — TSH: TSH: 2.22 mIU/L

## 2020-03-06 LAB — VITAMIN D 25 HYDROXY (VIT D DEFICIENCY, FRACTURES): Vit D, 25-Hydroxy: 22 ng/mL — ABNORMAL LOW (ref 30–100)

## 2020-03-06 LAB — MICROALBUMIN / CREATININE URINE RATIO
Creatinine, Urine: 55 mg/dL (ref 20–275)
Microalb Creat Ratio: 4 mcg/mg creat (ref <30)
Microalb, Ur: 0.2 mg/dL

## 2020-04-06 ENCOUNTER — Other Ambulatory Visit: Payer: Self-pay | Admitting: Physician Assistant

## 2020-04-06 DIAGNOSIS — E785 Hyperlipidemia, unspecified: Secondary | ICD-10-CM

## 2020-04-06 DIAGNOSIS — E1169 Type 2 diabetes mellitus with other specified complication: Secondary | ICD-10-CM

## 2020-05-13 ENCOUNTER — Other Ambulatory Visit: Payer: Self-pay | Admitting: Physician Assistant

## 2020-05-24 ENCOUNTER — Encounter: Payer: Self-pay | Admitting: Physician Assistant

## 2020-06-29 ENCOUNTER — Ambulatory Visit: Payer: No Typology Code available for payment source | Admitting: Physician Assistant

## 2020-06-30 ENCOUNTER — Other Ambulatory Visit: Payer: Self-pay | Admitting: Physician Assistant

## 2020-06-30 DIAGNOSIS — E1169 Type 2 diabetes mellitus with other specified complication: Secondary | ICD-10-CM

## 2020-07-07 ENCOUNTER — Other Ambulatory Visit: Payer: Self-pay | Admitting: Physician Assistant

## 2020-08-02 ENCOUNTER — Telehealth: Payer: Self-pay

## 2020-08-02 ENCOUNTER — Ambulatory Visit (INDEPENDENT_AMBULATORY_CARE_PROVIDER_SITE_OTHER): Payer: No Typology Code available for payment source | Admitting: Physician Assistant

## 2020-08-02 ENCOUNTER — Encounter: Payer: Self-pay | Admitting: Physician Assistant

## 2020-08-02 VITALS — BP 128/88 | HR 90 | Ht 70.0 in | Wt 268.0 lb

## 2020-08-02 DIAGNOSIS — Z1211 Encounter for screening for malignant neoplasm of colon: Secondary | ICD-10-CM

## 2020-08-02 MED ORDER — PLENVU 140 G PO SOLR: 1.0000 | ORAL | 0 refills | Status: DC

## 2020-08-02 NOTE — Telephone Encounter (Signed)
[facility] Pre-operative Risk Assessment     Request for surgical clearance:     Endoscopy Procedure  What type of surgery is being performed?     Colonoscopy  When is this surgery scheduled?     09/24/20  What type of clearance is required ?   Pharmacy  Are there any medications that need to be held prior to surgery and how long? Xarelto starting 1 day prior   Practice name and name of physician performing surgery?      [facility]  What is your office phone and fax number?      Phone- [phone]  [phone]  Anesthesia type (None, local, MAC, general) ?       MAC

## 2020-08-02 NOTE — Patient Instructions (Signed)
If you are age 56 or older, your body mass index should be between 23-30. Your Body mass index is 38.45 kg/m. If this is out of the aforementioned range listed, please consider follow up with your Primary Care Provider.  If you are age 53 or younger, your body mass index should be between 19-25. Your Body mass index is 38.45 kg/m. If this is out of the aformentioned range listed, please consider follow up with your Primary Care Provider.   You have been scheduled for a colonoscopy. Please follow written instructions given to you at your visit today.  Please pick up your prep supplies at the pharmacy within the next 1-3 days. If you use inhalers (even only as needed), please bring them with you on the day of your procedure.  You will be contacted by our office prior to your procedure for directions on holding your Xarelto.  If you do not hear from our office 1 week prior to your scheduled procedure, please call [phone] to discuss.   Follow up pending the results of your colonoscopy or as needed.

## 2020-08-02 NOTE — Progress Notes (Signed)
Subjective:    Patient ID: Andrea Bush, female    DOB: 1966/07/24, 54 y.o.   MRN: [id number]  HPI Andrea Bush is a pleasant 54 year old white female, new to GI today referred by Vicie Mutters, PA-C/Dr. Melford Aase for colon cancer screening.  Patient has not had any prior GI evaluation. She currently has no GI symptoms.  Specifically no complaints of abdominal pain, changes in bowel habits melena or hematochezia.  She gets occasional indigestion for which she uses Pepcid as needed. Family history is negative for colon cancer and polyps as far she is aware.  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  She says she had one other prior episode of pulmonary emboli a few years back.  Also with hypertension, adult onset diabetes mellitus, chronic kidney disease stage III. Most recent labs March 2011 hemoglobin of 16 iron studies within normal limits.  Review of Systems Pertinent positive and negative review of systems were noted in the above HPI section.  All other review of systems was otherwise negative.  Outpatient Encounter Medications as of 08/02/2020  Medication Sig  . acetaminophen (TYLENOL) 500 MG tablet Take 1,000 mg by mouth every 6 (six) hours as needed for mild pain.  . cetirizine (ZYRTEC) 10 MG tablet Take 10 mg by mouth daily.  . Cholecalciferol 5000 units capsule Take 5,000 Units by mouth daily.  . empagliflozin (JARDIANCE) 25 MG TABS tablet TAKE 1 TABLET DAILY FOR DIABETES  . famotidine (PEPCID) 20 MG tablet Take 1 tablet 2 x /day as needed for Acid Indigestion & Reflux  . lisinopril (ZESTRIL) 20 MG tablet TAKE 1 TABLET BY MOUTH EVERY DAY  . metFORMIN (GLUCOPHAGE-XR) 750 MG 24 hr tablet Take 1 tablet 2 x /day with Meals for Diabetes  . rivaroxaban (XARELTO) 20 MG TABS tablet Take 1 tablet Daily to Prevent Blood Clots  . PEG-KCl-NaCl-NaSulf-Na Asc-C (PLENVU) 140 g SOLR Take 1 kit by mouth as directed. Manufacturer's coupon  Universal coupon code:BIN: P2366821; GROUP: [id number]; PCN: [id number]; ID: [id number]; PAY NO MORE $50   No facility-administered encounter medications on file as of 08/02/2020.   Allergies  Allergen Reactions  . Farxiga [Dapagliflozin] Anaphylaxis  . Crestor [Rosuvastatin]     Hair Loss   . Penicillins Rash    Did it involve swelling of the face/tongue/throat, SOB, or low BP? No Did it involve sudden or severe rash/hives, skin peeling, or any reaction on the inside of your mouth or nose? No Did you need to seek medical attention at a hospital or doctor's office? No When did it last happen? If all above answers are "NO", may proceed with cephalosporin use.   Patient Active Problem List   Diagnosis Date Noted  . Type 2 diabetes mellitus with stage 3 chronic kidney disease, without long-term current use of insulin ([place]) 10/29/2019  . CKD stage 3 due to type 2 diabetes mellitus ([place]) 10/29/2019  . Obesity (BMI 30-39.9) 06/30/2019  . History of pulmonary embolus (PE) 04/15/2019  . Anemia, iron deficiency 09/30/2014  . Fibrocystic breast 09/30/2014  . Hyperlipidemia   . Vitamin D deficiency   . Hypertension   . Type 2 diabetes mellitus with hyperlipidemia [facility])    Social History   Socioeconomic History  . Marital status: Single    Spouse name: Not on file  . Number of children: Not on file  . Years of education: Not on file  . Highest education level: Not on file  Occupational History  . Not on file  Tobacco Use  . Smoking status: Never Smoker  . Smokeless tobacco: Never Used  Vaping Use  . Vaping Use: Never used  Substance and Sexual Activity  . Alcohol use: No  . Drug use: No  . Sexual activity: Not on file  Other Topics Concern  . Not on file  Social History Narrative  . Not on file   Social Determinants of Health   Financial Resource Strain:   . Difficulty of Paying Living Expenses: Not on file  Food Insecurity:   . Worried About Charity fundraiser in the  Last Year: Not on file  . Ran Out of Food in the Last Year: Not on file  Transportation Needs:   . Lack of Transportation (Medical): Not on file  . Lack of Transportation (Non-Medical): Not on file  Physical Activity:   . Days of Exercise per Week: Not on file  . Minutes of Exercise per Session: Not on file  Stress:   . Feeling of Stress : Not on file  Social Connections:   . Frequency of Communication with Friends and Family: Not on file  . Frequency of Social Gatherings with Friends and Family: Not on file  . Attends Religious Services: Not on file  . Active Member of Clubs or Organizations: Not on file  . Attends Archivist Meetings: Not on file  . Marital Status: Not on file  Intimate Partner Violence:   . Fear of Current or Ex-Partner: Not on file  . Emotionally Abused: Not on file  . Physically Abused: Not on file  . Sexually Abused: Not on file    Andrea Bush family history includes Cancer in her mother; Diabetes in her father; Osteoporosis in her mother.      Objective:    Vitals:   08/02/20 1522  BP: 128/88  Pulse: 90    Physical Exam Well-developed well-nourished female in no acute distress.  Height, Weight,268 BMI 38.45  HEENT; nontraumatic normocephalic, EOMI, PE RR LA, sclera anicteric. Oropharynx; not examined Neck; supple, no JVD Cardiovascular; regular rate and rhythm with S1-S2, no murmur rub or gallop Pulmonary; Clear bilaterally Abdomen; soft, obese, nontender, nondistended, no palpable mass or hepatosplenomegaly, bowel sounds are active Rectal; not done today Skin; benign exam, no jaundice rash or appreciable lesions Extremities; no clubbing cyanosis or edema skin warm and dry Neuro/Psych; alert and oriented x4, grossly nonfocal mood and affect appropriate       Assessment & Plan:   #25 54 year old white female referred for colon cancer screening, average risk and asymptomatic.  #2 chronic anticoagulation-on Xarelto #3 history of  recurrent bilateral pulmonary emboli, last episode July 2020. 4.  Hypertension 5.  Adult onset diabetes mellitus 6.  Chronic kidney disease stage III  Plan; Patient will be scheduled for colonoscopy with Dr. Ardis Hughs.  Procedure was discussed in detail with patient including indications risks and benefits and she is agreeable to proceed. She will need to hold Xarelto for 24 hours prior to procedure.  We will communicate with her PCP who is prescribing to assure this is reasonable for this patient. She has completed COVID-19 vaccination.   Kamani Lewter Genia Harold PA-C 08/02/2020   Cc: Unk Pinto, MD

## 2020-08-03 NOTE — Progress Notes (Signed)
I agree with the above note, plan 

## 2020-08-04 NOTE — Telephone Encounter (Signed)
   Andrea Bush has history of DM2, chol, anemia, obesity, CKD stage 3, and bilateral PE.  She is cleared for surgery.   Recommendations:  She is able to stop the Xarelto the day before the procedure and start back on it the day of the procedure.    Please call [phone] with any additional questions.  Sincerely  Vicie Mutters, PA-C

## 2020-08-04 NOTE — Telephone Encounter (Signed)
Left message for patient to call back to go over stopping her Xarelto.

## 2020-08-06 NOTE — Telephone Encounter (Signed)
Called and spoke with patient, she has been notified that she should stop her dose for 09/23/20 and she should be notified after procedure to resume her dose on 09/24/20 after the procedure.

## 2020-08-09 ENCOUNTER — Other Ambulatory Visit: Payer: Self-pay | Admitting: Internal Medicine

## 2020-08-21 ENCOUNTER — Other Ambulatory Visit: Payer: Self-pay | Admitting: Physician Assistant

## 2020-09-22 ENCOUNTER — Encounter: Payer: Self-pay | Admitting: Gastroenterology

## 2020-09-24 ENCOUNTER — Other Ambulatory Visit: Payer: Self-pay

## 2020-09-24 ENCOUNTER — Encounter: Payer: Self-pay | Admitting: Gastroenterology

## 2020-09-24 ENCOUNTER — Ambulatory Visit (AMBULATORY_SURGERY_CENTER): Payer: No Typology Code available for payment source | Admitting: Gastroenterology

## 2020-09-24 VITALS — BP 116/75 | HR 86 | Temp 97.1°F | Resp 23 | Ht 70.0 in | Wt 268.0 lb

## 2020-09-24 DIAGNOSIS — Z1211 Encounter for screening for malignant neoplasm of colon: Secondary | ICD-10-CM | POA: Diagnosis not present

## 2020-09-24 MED ORDER — SODIUM CHLORIDE 0.9 % IV SOLN: 500.0000 mL | Freq: Once | INTRAVENOUS | Status: DC

## 2020-09-24 NOTE — Op Note (Signed)
[facility] Patient Name: Andrea Bush Procedure Date: 09/24/2020 1:17 PM MRN: [id number] Endoscopist: Milus Banister , MD Age: 54 Referring MD:  Date of Birth: 04/20/1966 Gender: Female Account #: [id number] Procedure:                Colonoscopy Indications:              Screening for colorectal malignant neoplasm Medicines:                Monitored Anesthesia Care Procedure:                Pre-Anesthesia Assessment:                           - Prior to the procedure, a History and Physical                            was performed, and patient medications and                            allergies were reviewed. The patient's tolerance of                            previous anesthesia was also reviewed. The risks                            and benefits of the procedure and the sedation                            options and risks were discussed with the patient.                            All questions were answered, and informed consent                            was obtained. Prior Anticoagulants: The patient has                            taken Xarelto (rivaroxaban), last dose was 2 days                            prior to procedure. ASA Grade Assessment: III - A                            patient with severe systemic disease. After                            reviewing the risks and benefits, the patient was                            deemed in satisfactory condition to undergo the                            procedure.  After obtaining informed consent, the colonoscope                            was passed under direct vision. Throughout the                            procedure, the patient's blood pressure, pulse, and                            oxygen saturations were monitored continuously. The                            Colonoscope was introduced through the anus and                            advanced to the the cecum, identified by                             appendiceal orifice and ileocecal valve. The                            colonoscopy was performed without difficulty. The                            patient tolerated the procedure well. The quality                            of the bowel preparation was excellent. The                            ileocecal valve, appendiceal orifice, and rectum                            were photographed. Scope In: 1:23:19 PM Scope Out: 1:32:34 PM Scope Withdrawal Time: 0 hours 6 minutes 50 seconds  Total Procedure Duration: 0 hours 9 minutes 15 seconds  Findings:                 The entire examined colon appeared normal on direct                            and retroflexion views. Complications:            No immediate complications. Estimated blood loss:                            None. Estimated Blood Loss:     Estimated blood loss: none. Impression:               - The entire examined colon is normal on direct and                            retroflexion views.                           - No polyps or cancers. Recommendation:           -  Patient has a contact number available for                            emergencies. The signs and symptoms of potential                            delayed complications were discussed with the                            patient. Return to normal activities tomorrow.                            Written discharge instructions were provided to the                            patient.                           - Resume previous diet.                           - Continue present medications. You can restart                            your blood thinner today.                           - Repeat colonoscopy in 10 years for screening. Milus Banister, MD 09/24/2020 1:37:33 PM This report has been signed electronically.

## 2020-09-24 NOTE — Patient Instructions (Signed)
Read all of the handouts given to you by your recovery room nurse.  You may start  Your anticoagulant today per Dr. Ardis Hughs.  Thank-you for choosing [place] for your healthcare needs today.  YOU HAD AN ENDOSCOPIC PROCEDURE TODAY AT [facility]:   Refer to the procedure report that was given to you for any specific questions about what was found during the examination.  If the procedure report does not answer your questions, please call your gastroenterologist to clarify.  If you requested that your care partner not be given the details of your procedure findings, then the procedure report has been included in a sealed envelope for you to review at your convenience later.  YOU SHOULD EXPECT: Some feelings of bloating in the abdomen. Passage of more gas than usual.  Walking can help get rid of the air that was put into your GI tract during the procedure and reduce the bloating. If you had a lower endoscopy (such as a colonoscopy or flexible sigmoidoscopy) you may notice spotting of blood in your stool or on the toilet paper. If you underwent a bowel prep for your procedure, you may not have a normal bowel movement for a few days.  Please Note:  You might notice some irritation and congestion in your nose or some drainage.  This is from the oxygen used during your procedure.  There is no need for concern and it should clear up in a day or so.  SYMPTOMS TO REPORT IMMEDIATELY:   Following lower endoscopy (colonoscopy or flexible sigmoidoscopy):  Excessive amounts of blood in the stool  Significant tenderness or worsening of abdominal pains  Swelling of the abdomen that is new, acute  Fever of 100F or higher   For urgent or emergent issues, a gastroenterologist can be reached at any hour by calling [phone]. Do not use MyChart messaging for urgent concerns.    DIET:  We do recommend a small meal at first, but then you may proceed to your regular diet.  Drink plenty of fluids but you  should avoid alcoholic beverages for 24 hours.  ACTIVITY:  You should plan to take it easy for the rest of today and you should NOT DRIVE or use heavy machinery until tomorrow (because of the sedation medicines used during the test).    FOLLOW UP: Our staff will call the number listed on your records 48-72 hours following your procedure to check on you and address any questions or concerns that you may have regarding the information given to you following your procedure. If we do not reach you, we will leave a message.  We will attempt to reach you two times.  During this call, we will ask if you have developed any symptoms of COVID 19. If you develop any symptoms (ie: fever, flu-like symptoms, shortness of breath, cough etc.) before then, please call [phone].  If you test positive for Covid 19 in the 2 weeks post procedure, please call and report this information to [place].     SIGNATURES/CONFIDENTIALITY: You and/or your care partner have signed paperwork which will be entered into your electronic medical record.  These signatures attest to the fact that that the information above on your After Visit Summary has been reviewed and is understood.  Full responsibility of the confidentiality of this discharge information lies with you and/or your care-partner.

## 2020-09-24 NOTE — Progress Notes (Signed)
Pt Drowsy. VSS. To PACU, report to RN. No anesthetic complications noted.  

## 2020-09-24 NOTE — Progress Notes (Signed)
VS by CW. ?

## 2020-09-28 ENCOUNTER — Telehealth: Payer: Self-pay | Admitting: *Deleted

## 2020-09-28 NOTE — Telephone Encounter (Signed)
  Follow up Call-  Call back number 09/24/2020  Post procedure Call Back phone  # [phone]  Permission to leave phone message Yes  Some recent data might be hidden     Patient questions:  Do you have a fever, pain , or abdominal swelling? No. Pain Score  0 *  Have you tolerated food without any problems? Yes.    Have you been able to return to your normal activities? Yes.    Do you have any questions about your discharge instructions: Diet   No. Medications  No. Follow up visit  No.  Do you have questions or concerns about your Care? No.  Actions: * If pain score is 4 or above: No action needed, pain <4.  1. Have you developed a fever since your procedure? no  2.   Have you had an respiratory symptoms (SOB or cough) since your procedure? no  3.   Have you tested positive for COVID 19 since your procedure no  4.   Have you had any family members/close contacts diagnosed with the COVID 19 since your procedure?  no   If yes to any of these questions please route to Joylene John, RN and Joella Prince, RN

## 2020-09-29 ENCOUNTER — Other Ambulatory Visit: Payer: Self-pay | Admitting: Internal Medicine

## 2020-10-06 ENCOUNTER — Ambulatory Visit (INDEPENDENT_AMBULATORY_CARE_PROVIDER_SITE_OTHER): Payer: No Typology Code available for payment source | Admitting: Adult Health Nurse Practitioner

## 2020-10-06 ENCOUNTER — Encounter: Payer: Self-pay | Admitting: Adult Health Nurse Practitioner

## 2020-10-06 ENCOUNTER — Other Ambulatory Visit: Payer: Self-pay

## 2020-10-06 VITALS — BP 120/70 | HR 67 | Temp 97.6°F | Wt 265.0 lb

## 2020-10-06 DIAGNOSIS — E1169 Type 2 diabetes mellitus with other specified complication: Secondary | ICD-10-CM

## 2020-10-06 DIAGNOSIS — D509 Iron deficiency anemia, unspecified: Secondary | ICD-10-CM

## 2020-10-06 DIAGNOSIS — E669 Obesity, unspecified: Secondary | ICD-10-CM

## 2020-10-06 DIAGNOSIS — E785 Hyperlipidemia, unspecified: Secondary | ICD-10-CM

## 2020-10-06 DIAGNOSIS — I10 Essential (primary) hypertension: Secondary | ICD-10-CM

## 2020-10-06 DIAGNOSIS — Z86711 Personal history of pulmonary embolism: Secondary | ICD-10-CM

## 2020-10-06 DIAGNOSIS — E1122 Type 2 diabetes mellitus with diabetic chronic kidney disease: Secondary | ICD-10-CM

## 2020-10-06 DIAGNOSIS — N183 Chronic kidney disease, stage 3 unspecified: Secondary | ICD-10-CM

## 2020-10-06 DIAGNOSIS — E559 Vitamin D deficiency, unspecified: Secondary | ICD-10-CM

## 2020-10-06 DIAGNOSIS — Z79899 Other long term (current) drug therapy: Secondary | ICD-10-CM

## 2020-10-06 NOTE — Progress Notes (Signed)
FOLLOW UP 3 MONTH   Assessment and Plan:   Essential hypertension - continue medications, DASH diet, exercise and monitor at home. Call if greater than 130/80.  - CBC with Differential/Platelet - BASIC METABOLIC PANEL WITH GFR - Hepatic function panel   Hyperlipidemia  check lipids, decrease fatty foods, increase activity.  - Lipid panel  Type 2 diabetes mellitus with hyperlipidemia (HCC) -     Hemoglobin A1c -     EKG 12-Lead - declines chol medication Patient very motivated to be healthier - may do glyambi versus adding on GLP for weight loss  CKD stage 3 due to type 2 diabetes mellitus (HCC) -     Hemoglobin A1c -     EKG 12-Lead - continue medications Increase water  Type 2 diabetes mellitus with stage 3 chronic kidney disease, without long-term current use of insulin, unspecified whether stage 3a or 3b CKD (HCC) -     Hemoglobin A1c -     EKG 12-Lead Discussed general issues about diabetes pathophysiology and management., Educational material distributed., Suggested low cholesterol diet., Encouraged aerobic exercise., Discussed foot care., Reminded to get yearly retinal exam.  Iron deficiency anemia, unspecified iron deficiency anemia type -     Iron,Total/Total Iron Binding Cap -     Vitamin B12  History of pulmonary embolus (PE) Continue xarelto, increase walking, weight loss advised, get colonoscopy   Hypothyroidism, unspecified hypothyroidism type Off medication, will monitor labs.  - TSH  Morbid obesity, unspecified obesity type ([place]) Obesity with co morbidities- long discussion about weight loss, diet, and exercise   Vitamin D deficiency - Vit D  25 hydroxy (rtn osteoporosis monitoring)  BMI 37.0-37.9, adult Morbid Obesity with co morbidities - long discussion about weight loss, diet, and exercise   Discussed med's effects and SE's. Screening labs and tests as requested with regular follow-up as recommended. Future Appointments  Date Time  Provider [place]  03/17/2021  9:00 AM Rubens Cranston, Danton Sewer, NP [facility] None    HPI  54 y.o. female  presents for a 3 month follow up on diet controlled hypertension, and HLD, DMII, history of PE on xarelto 20mg  daily.  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  She has increased her water intake. Patient is not cooperative with questioning or engaging in conversations regarding health or health improvement barriers.  She was treated for sinus infection 10/2019. She is complaining of right ear pain x 1 week with nausea. No fever, no chills. Has been on allergy pill, not on nasal spray.   She has been taking jardiance regularly.  Admits she is not taking the metformin she reports that it does cause some GI upset and she is not ableto jump up form her work as she is on camera.  She has not made any lifestyle adjustment and is resistant to talk about medications or compliance to improve health.   Her blood pressure has been controlled at home, today their BP is BP: 120/70  She does not workout, she admits that with working at home and the weather she is not working out. She denies chest pain, shortness of breath, dizziness.  BMI is Body mass index is 38.02 kg/m., she is working on diet and exercise. working at [employer].  Wt Readings from Last 3 Encounters:  10/06/20 265 lb (120.2 kg)  09/24/20 268 lb (121.6  kg)  08/02/20 268 lb (121.6 kg)   She is VERY resistant to medications but we had a VERY serious talking about consequences of blindness, kidney failure, stroke, MI, loss of limb if these numbers are not improved.   She is not on cholesterol medication ,she states with crestor she had hair falling out. Her cholesterol is at goal. The cholesterol last visit was:  Lab Results  Component Value Date   CHOL 230 (H) 10/06/2020   HDL 49 (L) 10/06/2020   LDLCALC 145 (H)  10/06/2020   TRIG 223 (H) 10/06/2020   CHOLHDL 4.7 10/06/2020    She has been working on diet and exercise for diabetes she is not on ASA  she is on ACE/lisinopril she can not tolerate farxiga due to nasopharyngitis symptoms - she is on jardiance 25mg  and tolerating.   just on 750mg  metformin a day- takes [facility]- due to working at home and upset stomach.  NOT checking her sugars at home but has a monitor from work.   and denies paresthesia of the feet, polydipsia, polyuria and visual disturbances. Has a meter at home, not checking.   Last A1C in the office was: Lab Results  Component Value Date   HGBA1C 8.4 (H) 10/06/2020   Lab Results  Component Value Date   GFRNONAA 66 10/06/2020   Patient is on Vitamin D supplement, 5000 a day but not consistenly  Lab Results  Component Value Date   VD25OH 31 10/06/2020   She is not on thyroid medication. Her medication was not changed last visit.   Lab Results  Component Value Date   TSH 2.22 03/05/2020   Current Medications:  Current Outpatient Medications on File Prior to Visit  Medication Sig  . acetaminophen (TYLENOL) 500 MG tablet Take 1,000 mg by mouth every 6 (six) hours as needed for mild pain.  . cetirizine (ZYRTEC) 10 MG tablet Take 10 mg by mouth daily.  . Cholecalciferol 5000 units capsule Take 5,000 Units by mouth daily.  . empagliflozin (JARDIANCE) 25 MG TABS tablet TAKE 1 TABLET DAILY FOR DIABETES  . famotidine (PEPCID) 20 MG tablet Take 1 tablet 2 x /day as needed for Acid Indigestion & Reflux  . lisinopril (ZESTRIL) 20 MG tablet Take 1 tablet Daily for BP & Diabetic Kidney Protection  . metFORMIN (GLUCOPHAGE-XR) 750 MG 24 hr tablet TAKE 1 TABLET 2 X /DAY WITH MEALS FOR DIABETES  . rivaroxaban (XARELTO) 20 MG TABS tablet Take     1 tablet    Daily      to Prevent Blood Clots   No current facility-administered medications on file prior to visit.    Health Maintenance:   Immunization History  Administered Date(s)  Administered  . Influenza,inj,Quad PF,6+ Mos 09/06/2019  . PFIZER SARS-COV-2 Vaccination 06/16/2020  . Pneumococcal-Unspecified 12/12/2003  . Tdap 06/10/2012   Tetanus: 2013 Pneumovax: 2005 Prevnar 13: NA Flu vaccine: declines Zostavax: NA  LMP: No LMP recorded. Patient is postmenopausal.  Pap: Dec 2020 follows with Philis Pique normal, neg HPV MGM: Dec 2020 Dr. Philis Pique  DEXA: N/A Colonoscopy:  09/24/2020 EGD:  N/A Echo 06/2019 CTA 06/2019 [place] AB 2017 Last Dental Exam: Dental works Last Eye Exam: 5 years ago but states no issues at this time, DUE Patient Care Team: Unk Pinto, MD as PCP - General (Internal Medicine)  Medical History:  Past Medical History:  Diagnosis Date  . Clotting disorder ([place])   . Fibrocystic breast   . Hyperlipidemia   . Hypertension   .  Hypothyroidism   . Iron deficiency anemia   . Obesity    BMI 41  . Prediabetes   . Pulmonary embolism ([place]) 2006   from BCP  . Vitamin D deficiency    Allergies Allergies  Allergen Reactions  . Farxiga [Dapagliflozin] Anaphylaxis  . Crestor [Rosuvastatin]     Hair Loss   . Penicillins Rash    Did it involve swelling of the face/tongue/throat, SOB, or low BP? No Did it involve sudden or severe rash/hives, skin peeling, or any reaction on the inside of your mouth or nose? No Did you need to seek medical attention at a hospital or doctor's office? No When did it last happen? If all above answers are "NO", may proceed with cephalosporin use.    SURGICAL HISTORY She  has a past surgical history that includes Tubal ligation (Bilateral); Tonsillectomy and adenoidectomy; Ablation (2006); and Thyroid surgery.   FAMILY HISTORY Her family history includes Cancer in her mother; Diabetes in her father; Osteoporosis in her mother.  SOCIAL HISTORY She  reports that she has never smoked. She has never used smokeless tobacco. She reports that she does not drink alcohol and does not use drugs.  Review of  Systems  Constitutional: Negative for chills, diaphoresis, fever, malaise/fatigue and weight loss.  HENT: Negative for congestion, ear discharge, ear pain, hearing loss, nosebleeds, sinus pain, sore throat and tinnitus.   Eyes: Negative for blurred vision, double vision, photophobia, pain, discharge and redness.  Respiratory: Negative for cough, hemoptysis, sputum production, shortness of breath, wheezing and stridor.   Cardiovascular: Negative for chest pain, palpitations, orthopnea, claudication, leg swelling and PND.  Gastrointestinal: Negative for abdominal pain, blood in stool, constipation, diarrhea, heartburn, melena, nausea and vomiting.  Genitourinary: Negative for dysuria, flank pain, frequency, hematuria and urgency.  Musculoskeletal: Negative for back pain, falls, joint pain, myalgias and neck pain.  Skin: Negative for itching and rash.  Neurological: Negative for dizziness, tingling, tremors, sensory change, speech change, focal weakness, seizures, loss of consciousness, weakness and headaches.  Endo/Heme/Allergies: Negative for environmental allergies and polydipsia. Does not bruise/bleed easily.  Psychiatric/Behavioral: Negative for depression, hallucinations, memory loss, substance abuse and suicidal ideas. The patient is not nervous/anxious and does not have insomnia.     Physical Exam: Estimated body mass index is 38.02 kg/m as calculated from the following:   Height as of 09/24/20: 5\' 10"  (1.778 m).   Weight as of this encounter: 265 lb (120.2 kg). BP 120/70   Pulse 67   Temp 97.6 F (36.4 C)   Wt 265 lb (120.2 kg)   SpO2 97%   BMI 38.02 kg/m  General Appearance: Well nourished, in no apparent distress.  Eyes: PERRLA, EOMs, conjunctiva no swelling or erythema, normal fundi and vessels.  Sinuses: No Frontal/maxillary tenderness  ENT/Mouth: Ext aud canals clear, normal light reflex with TMs without erythema, bulging. Good dentition. No erythema, swelling, or exudate on  post pharynx. Tonsils not swollen or erythematous. Hearing normal.  Neck: Supple, thyroid normal. No bruits  Respiratory: Respiratory effort normal, BS equal bilaterally without rales, rhonchi, wheezing or stridor.  Cardio: RRR without murmurs, rubs or gallops. Brisk peripheral pulses without edema.  Chest: symmetric, with normal excursions and percussion.  Breasts: defer Abdomen: Soft, nontender, obese, mild hepatomegaly,  no guarding, rebound, hernias, masses, or organomegaly.  Lymphatics: Non tender without lymphadenopathy.  Genitourinary: defer Musculoskeletal: Full ROM all peripheral extremities,5/5 strength, and normal gait.  Skin: Left chest with scaly erythematous nodule. Warm, dry without rashes, lesions,  ecchymosis. Neuro: Cranial nerves intact, reflexes equal bilaterally. Normal muscle tone, no cerebellar symptoms. Sensation intact.  Psych: Awake and oriented X 3, normal affect, Insight and Judgment appropriate.      Garnet Sierras, Laqueta Jean, DNP [facility] 09/16/2020  4:36 PM

## 2020-10-07 LAB — HEMOGLOBIN A1C
Hgb A1c MFr Bld: 8.4 % of total Hgb — ABNORMAL HIGH (ref <5.7)
Mean Plasma Glucose: 194 (calc)
eAG (mmol/L): 10.8 (calc)

## 2020-10-07 LAB — COMPLETE METABOLIC PANEL WITH GFR
AG Ratio: 1.3 (calc) (ref 1.0–2.5)
ALT: 28 U/L (ref 6–29)
AST: 24 U/L (ref 10–35)
Albumin: 4.1 g/dL (ref 3.6–5.1)
Alkaline phosphatase (APISO): 43 U/L (ref 37–153)
BUN: 24 mg/dL (ref 7–25)
CO2: 29 mmol/L (ref 20–32)
Calcium: 9.7 mg/dL (ref 8.6–10.4)
Chloride: 102 mmol/L (ref 98–110)
Creat: 0.96 mg/dL (ref 0.50–1.05)
GFR, Est African American: 77 mL/min/{1.73_m2} (ref >=60)
GFR, Est Non African American: 66 mL/min/{1.73_m2} (ref >=60)
Globulin: 3.1 g/dL (calc) (ref 1.9–3.7)
Glucose, Bld: 144 mg/dL — ABNORMAL HIGH (ref 65–99)
Potassium: 4.1 mmol/L (ref 3.5–5.3)
Sodium: 139 mmol/L (ref 135–146)
Total Bilirubin: 0.5 mg/dL (ref 0.2–1.2)
Total Protein: 7.2 g/dL (ref 6.1–8.1)

## 2020-10-07 LAB — CBC WITH DIFFERENTIAL/PLATELET
Absolute Monocytes: 512 cells/uL (ref 200–950)
Basophils Absolute: 72 cells/uL (ref 0–200)
Basophils Relative: 0.9 %
Eosinophils Absolute: 152 cells/uL (ref 15–500)
Eosinophils Relative: 1.9 %
HCT: 47.5 % — ABNORMAL HIGH (ref 35.0–45.0)
Hemoglobin: 16.1 g/dL — ABNORMAL HIGH (ref 11.7–15.5)
Lymphs Abs: 3584 cells/uL (ref 850–3900)
MCH: 30.8 pg (ref 27.0–33.0)
MCHC: 33.9 g/dL (ref 32.0–36.0)
MCV: 91 fL (ref 80.0–100.0)
MPV: 8.7 fL (ref 7.5–12.5)
Monocytes Relative: 6.4 %
Neutro Abs: 3680 cells/uL (ref 1500–7800)
Neutrophils Relative %: 46 %
Platelets: 272 10*3/uL (ref 140–400)
RBC: 5.22 10*6/uL — ABNORMAL HIGH (ref 3.80–5.10)
RDW: 12.2 % (ref 11.0–15.0)
Total Lymphocyte: 44.8 %
WBC: 8 10*3/uL (ref 3.8–10.8)

## 2020-10-07 LAB — LIPID PANEL
Cholesterol: 230 mg/dL — ABNORMAL HIGH (ref <200)
HDL: 49 mg/dL — ABNORMAL LOW (ref >=50)
LDL Cholesterol (Calc): 145 mg/dL (calc) — ABNORMAL HIGH
Non-HDL Cholesterol (Calc): 181 mg/dL (calc) — ABNORMAL HIGH (ref <130)
Total CHOL/HDL Ratio: 4.7 (calc) (ref <5.0)
Triglycerides: 223 mg/dL — ABNORMAL HIGH (ref <150)

## 2020-10-07 LAB — INSULIN, RANDOM: Insulin: 19.6 u[IU]/mL

## 2020-10-07 LAB — VITAMIN D 25 HYDROXY (VIT D DEFICIENCY, FRACTURES): Vit D, 25-Hydroxy: 31 ng/mL (ref 30–100)

## 2020-10-15 ENCOUNTER — Other Ambulatory Visit: Payer: Self-pay | Admitting: Adult Health Nurse Practitioner

## 2020-10-15 DIAGNOSIS — J0140 Acute pansinusitis, unspecified: Secondary | ICD-10-CM

## 2020-10-15 MED ORDER — AZITHROMYCIN 250 MG PO TABS: ORAL_TABLET | ORAL | 1 refills | Status: AC

## 2020-11-25 ENCOUNTER — Other Ambulatory Visit: Payer: Self-pay | Admitting: Internal Medicine

## 2020-11-30 ENCOUNTER — Other Ambulatory Visit: Payer: Self-pay | Admitting: Adult Health Nurse Practitioner

## 2020-11-30 DIAGNOSIS — E1169 Type 2 diabetes mellitus with other specified complication: Secondary | ICD-10-CM

## 2020-11-30 MED ORDER — EMPAGLIFLOZIN 25 MG PO TABS: ORAL_TABLET | ORAL | 0 refills | Status: DC

## 2021-01-02 ENCOUNTER — Other Ambulatory Visit: Payer: Self-pay | Admitting: Internal Medicine

## 2021-01-03 ENCOUNTER — Telehealth (INDEPENDENT_AMBULATORY_CARE_PROVIDER_SITE_OTHER): Payer: No Typology Code available for payment source

## 2021-01-03 DIAGNOSIS — U071 COVID-19: Secondary | ICD-10-CM | POA: Diagnosis not present

## 2021-01-03 MED ORDER — PROMETHAZINE-DM 6.25-15 MG/5ML PO SYRP: 5.0000 mL | ORAL_SOLUTION | Freq: Four times a day (QID) | ORAL | 1 refills | Status: DC | PRN

## 2021-01-03 MED ORDER — DEXAMETHASONE 1 MG PO TABS: ORAL_TABLET | ORAL | 0 refills | Status: DC

## 2021-01-03 NOTE — Telephone Encounter (Signed)
Virtual Visit via Telephone Note  I connected with Andrea Bush on 01/03/2021 at 1515 by telephone and verified that I am speaking with the correct person using two identifiers.  Location: Patient: home Provider: [place] office    I discussed the limitations, risks, security and privacy concerns of performing an evaluation and management service by telephone and the availability of in person appointments. I also discussed with the patient that there may be a patient responsible charge related to this service. The patient expressed understanding and agreed to proceed.   History of Present Illness:  55 y.o. female with T2DM, with confirmed covid 19 as of 12/30/2020 requested evaluation. She reports 2/2, 2021, pfizer vaccinated.   She reports sx began 12/30/2020 with fever and feeling unwell, nauseous, nasal congestion, sinus pressure, ear pressure, very dry throat, MM very dry, blood with blowing nose, also mostly dry cough (occasional white or brown, variable mucus). Feels mostly upper resp, denies CP, dyspnea, chest congestion. She reports did have mild temp first few days, recently "normal"; has had intermittent chills.   She has been taking alka selzter plus, tylenol, iburprofen, elder berry supplement Has been doing saline nasal sprays but limited benefit   Feeling very dry and stopped up, struggling to clear secretions  She has been doing breathing exercises, not monitoring O2 at home  She had 2/2, 2021, pfizer  Got from husband who developed sx last week; he tested positive on 12/31/2020.   She admits hasn't been checking glucose since onset of covid 19 sx; does report has all necessary supplies and know how to do so -  Lab Results  Component Value Date   HGBA1C 8.4 (H) 10/06/2020    Allergies:  Allergies  Allergen Reactions  . Farxiga [Dapagliflozin] Anaphylaxis  . Crestor [Rosuvastatin]     Hair Loss   . Penicillins Rash    Did it involve swelling of the  face/tongue/throat, SOB, or low BP? No Did it involve sudden or severe rash/hives, skin peeling, or any reaction on the inside of your mouth or nose? No Did you need to seek medical attention at a hospital or doctor's office? No When did it last happen? If all above answers are "NO", may proceed with cephalosporin use.   Medical History:  has Hyperlipidemia; Vitamin D deficiency; Hypertension; Type 2 diabetes mellitus with hyperlipidemia ([place]); Anemia, iron deficiency; Fibrocystic breast; History of pulmonary embolus (PE); Obesity (BMI 30-39.9); Type 2 diabetes mellitus with stage 3 chronic kidney disease, without long-term current use of insulin ([place]); and CKD stage 3 due to type 2 diabetes mellitus ([place]) on their problem list. Surgical History:  She  has a past surgical history that includes Tubal ligation (Bilateral); Tonsillectomy and adenoidectomy; Ablation (2006); and Thyroid surgery. Family History:  Herfamily history includes Cancer in her mother; Diabetes in her father; Osteoporosis in her mother. Social History:   reports that she has never smoked. She has never used smokeless tobacco. She reports that she does not drink alcohol and does not use drugs.    Observations/Objective:  General : hoarse sounding patient, appears feeling unwell but in no acute distress HEENT: hoarse vocal quality, no cough for duration of visit Lungs: speaks in complete sentences, no audible wheezing, no apparent distress Neurological: alert, oriented x 3 Psychiatric: pleasant, judgement appropriate    Assessment and Plan:  Covid 19 Covid 19 positive per rapid screening test 12/30/2020 at home Risk factors include: T2DM, htn, obesity Symptoms are: mild Immue support with vitamin D 1000  mg daily, zinc 50 mg daily, vitamin D Reviewed doing regular breathing exercises, proning Take tylenol PRN temp 101+ Push hydration Regular ambulation or calf exercises exercises for clot prevention, continue  xarelto Sx supportive therapy suggested Continue regular saline sprays/irrigations for congestion and dry MM Steroid taper discussed; cautioned risk of hyperglycemia; she will start checking BID glucose; follow up mychart if 200+, will add short term glipizide Follow up via mychart or telephone if needed Advised patient obtain O2 monitor; present to ED if persistently <88% or with severe dyspnea, CP, fever uncontrolled by tylenol, confusion, sudden decline Should remain in isolation until at least 10 days from onset of sx, 24-48 hours fever free without tylenol, sx such as cough are improved.  -     dexamethasone (DECADRON) 1 MG tablet; Take 3 tabs for 3 days, 2 tabs for 3 days 1 tab for 5 days. Take with food. -     promethazine-dextromethorphan (PROMETHAZINE-DM) 6.25-15 MG/5ML syrup; Take 5 mLs by mouth 4 (four) times daily as needed for cough.   Follow Up Instructions:    I discussed the assessment and treatment plan with the patient. The patient was provided an opportunity to ask questions and all were answered. The patient agreed with the plan and demonstrated an understanding of the instructions.   The patient was advised to call back or seek an in-person evaluation if the symptoms worsen or if the condition fails to improve as anticipated.  I provided 20 minutes of non-face-to-face time during this encounter.   Izora Ribas, NP

## 2021-01-03 NOTE — Addendum Note (Signed)
Addended by: Izora Ribas on: 01/03/2021 03:50 PM   Modules accepted: Orders

## 2021-01-03 NOTE — Telephone Encounter (Signed)
Patient states that she sent a MyChart message to provider and was advised to take tylenol. States that she is feeling much worse and would like to know if something else can be sent in?

## 2021-01-03 NOTE — Addendum Note (Signed)
Addended by: Izora Ribas on: 01/03/2021 05:17 PM   Modules accepted: Level of Service

## 2021-02-09 ENCOUNTER — Encounter: Payer: 59 | Admitting: Physician Assistant

## 2021-02-21 ENCOUNTER — Other Ambulatory Visit: Payer: Self-pay | Admitting: Adult Health

## 2021-03-01 ENCOUNTER — Other Ambulatory Visit: Payer: Self-pay | Admitting: Adult Health Nurse Practitioner

## 2021-03-01 DIAGNOSIS — E1169 Type 2 diabetes mellitus with other specified complication: Secondary | ICD-10-CM

## 2021-03-01 DIAGNOSIS — E785 Hyperlipidemia, unspecified: Secondary | ICD-10-CM

## 2021-03-16 ENCOUNTER — Ambulatory Visit: Payer: No Typology Code available for payment source | Admitting: Adult Health

## 2021-03-17 ENCOUNTER — Other Ambulatory Visit: Payer: Self-pay

## 2021-03-17 ENCOUNTER — Ambulatory Visit (INDEPENDENT_AMBULATORY_CARE_PROVIDER_SITE_OTHER): Payer: 59 | Admitting: Adult Health Nurse Practitioner

## 2021-03-17 ENCOUNTER — Encounter: Payer: Self-pay | Admitting: Adult Health Nurse Practitioner

## 2021-03-17 DIAGNOSIS — Z1321 Encounter for screening for nutritional disorder: Secondary | ICD-10-CM

## 2021-03-17 DIAGNOSIS — Z0000 Encounter for general adult medical examination without abnormal findings: Secondary | ICD-10-CM

## 2021-03-17 DIAGNOSIS — E1169 Type 2 diabetes mellitus with other specified complication: Secondary | ICD-10-CM

## 2021-03-17 DIAGNOSIS — N183 Chronic kidney disease, stage 3 unspecified: Secondary | ICD-10-CM

## 2021-03-17 DIAGNOSIS — Z1389 Encounter for screening for other disorder: Secondary | ICD-10-CM

## 2021-03-17 DIAGNOSIS — E1122 Type 2 diabetes mellitus with diabetic chronic kidney disease: Secondary | ICD-10-CM

## 2021-03-17 DIAGNOSIS — E669 Obesity, unspecified: Secondary | ICD-10-CM

## 2021-03-17 DIAGNOSIS — Z86711 Personal history of pulmonary embolism: Secondary | ICD-10-CM

## 2021-03-17 DIAGNOSIS — Z79899 Other long term (current) drug therapy: Secondary | ICD-10-CM

## 2021-03-17 DIAGNOSIS — E039 Hypothyroidism, unspecified: Secondary | ICD-10-CM

## 2021-03-17 DIAGNOSIS — I10 Essential (primary) hypertension: Secondary | ICD-10-CM

## 2021-03-17 DIAGNOSIS — E559 Vitamin D deficiency, unspecified: Secondary | ICD-10-CM

## 2021-03-17 DIAGNOSIS — Z136 Encounter for screening for cardiovascular disorders: Secondary | ICD-10-CM

## 2021-03-17 DIAGNOSIS — E785 Hyperlipidemia, unspecified: Secondary | ICD-10-CM

## 2021-03-17 DIAGNOSIS — D509 Iron deficiency anemia, unspecified: Secondary | ICD-10-CM

## 2021-03-17 MED ORDER — EMPAGLIFLOZIN 25 MG PO TABS: ORAL_TABLET | ORAL | 1 refills | Status: DC

## 2021-03-17 NOTE — Progress Notes (Addendum)
COMPLETE PHYSICAL   Assessment and Plan:   Essential hypertension - continue medications: lisinopril 20mg  DASH diet, exercise and monitor at home. Call if greater than 130/80.  - CBC with Differential/Platelet - BASIC METABOLIC PANEL WITH GFR    Hyperlipidemia Has been prescribed statins, hair loss, declines Discussed zetia, nexletol?  check lipids, decrease fatty foods, increase activity.  - Lipid panel  Type 2 diabetes mellitus with hyperlipidemia ([place]) Taking Jardiance, ran out Not taking Metofmrin 500mg  XL related to GI, diarrhea -     Hemoglobin A1c - declines chol medication Patient not motivated to be healthier Discussed general issues about diabetes pathophysiology and management. Education: Reviewed 'ABCs' of diabetes management (respective goals in parentheses):  A1C (<7), blood pressure (<130/80), and cholesterol (LDL <70) Dietary recommendations Encouraged aerobic exercise.  Discussed foot care, check daily Yearly retinal exam Dental exam every 6 months Monitor blood glucose, discussed goal for patient   CKD stage 3 due to type 2 diabetes mellitus ([place]) -     Hemoglobin A1c - continue medications Increase water  Type 2 diabetes mellitus with stage 3 chronic kidney disease, without long-term current use of insulin, unspecified whether stage 3a or 3b CKD (HCC) Increase fluids  Avoid NSAIDS Blood pressure control Monitor sugars  Will continue to monitor  Iron deficiency anemia, unspecified iron deficiency anemia type -     Iron,Total/Total Iron Binding Cap -     Vitamin B12  History of pulmonary embolus (PE) Continue xarelto, increase walking, weight loss advised  Hypothyroidism, unspecified hypothyroidism type Off medication, will monitor labs.  - TSH  Morbid obesity, unspecified obesity type ([place]) Obesity with co morbidities- long discussion about weight loss, diet, and exercise   Vitamin D deficiency Continue supplementation to maintain goal  of 70-100 Taking Vitamin D 5,000 IU daily - Vit D  25 hydroxy (rtn osteoporosis monitoring)  BMI 37.0-37.9, adult Morbid Obesity with co morbidities - long discussion about weight loss, diet, and exercise   Medication Management Continued   Further disposition pending results if labs check today. Discussed med's effects and SE's.   Over 30 minutes of face to face interview, exam, counseling, chart review, and critical decision making was performed.   Discussed med's effects and SE's. Screening labs and tests as requested with regular follow-up as recommended. Future Appointments  Date Time Provider [place]  03/17/2021  9:00 AM Garnet Sierras, NP [facility] None  03/20/2022  9:00 AM Saamir Armstrong, Danton Sewer, NP [facility] None   ______________________________________________________________  HPI  55 y.o. female  presents for complete physical and follow up on HTN, and HLD, T2DM, Vitamin D Defciency and weight, history of PE on xarelto 20mg  daily.  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  Also reason she reports she does not take metformin any longer as diarrhea was preventing her from doing her job.  She reports no changes to her diet or activity.  She has increased her water intake.  She admits she needs to exercise but does not and reports she knows she is not making healthy food choices. Her blood pressure has been controlled at home, today their BP is     120/70 She does not workout, she admits that with working at home and the weather she is not working out. She denies chest pain, shortness of breath, dizziness.  BMI is 38.02 , she is working on diet and exercise. working at [employer].  Wt Readings from Last 3 Encounters:  10/06/20 265 lb (120.2 kg)  09/24/20 268 lb (121.6 kg)  08/02/20 268 lb (121.6 kg)   She is VERY resistant to medications but long discussion about diabetes, HTN and HLD uncontrolled as  increasing risk of heart attack, or stroke, consequences of blindness, kidney failure, impaired healing and loss of limbs to name a few, if these numbers are not improved.   She is not on cholesterol medication ,she states with crestor she had hair falling out. Her cholesterol is not at goal. The cholesterol last visit was:  Lab Results  Component Value Date   CHOL 230 (H) 10/06/2020   HDL 49 (L) 10/06/2020   LDLCALC 145 (H) 10/06/2020   TRIG 223 (H) 10/06/2020   CHOLHDL 4.7 10/06/2020    She has not been working on diet and exercise for diabetes she is not on ASA, taking xarelto she is on ACE/lisinopril she can not tolerate farxiga due to nasopharyngitis symptoms - she is on jardiance 25mg  and tolerating.   NOT Metformin at this time.  Was taking  750mg  metformin a day gives her diarrhea.   NOT checking her sugars at home but has a monitor from work.   and denies paresthesia of the feet, polydipsia, polyuria and visual disturbances. Has a meter at home, not checking.  Discussed importance of diabetic eye exam, past due.  Last A1C in the office was: Lab Results  Component Value Date   HGBA1C 8.4 (H) 10/06/2020   Lab Results  Component Value Date   GFRNONAA 66 10/06/2020   Patient is on Vitamin D supplement, 5000 a day but not consistenly  Lab Results  Component Value Date   VD25OH 31 10/06/2020   She is not on thyroid medication. Her medication was not changed last visit.   Lab Results  Component Value Date   TSH 2.22 03/05/2020   Current Medications:  Current Outpatient Medications on File Prior to Visit  Medication Sig  . acetaminophen (TYLENOL) 500 MG tablet Take 1,000 mg by mouth every 6 (six) hours as needed for mild pain.  . cetirizine (ZYRTEC) 10 MG tablet Take 10 mg by mouth daily.  . Cholecalciferol 5000 units capsule Take 5,000 Units by mouth daily.  Marland Kitchen dexamethasone (DECADRON) 1 MG tablet Take 3 tabs for 3 days, 2 tabs for 3 days 1 tab for 5 days. Take with  food.  . famotidine (PEPCID) 20 MG tablet Take 1 tablet 2 x /day as needed for Acid Indigestion & Reflux  . JARDIANCE 25 MG TABS tablet TAKE 1 TABLET BY MOUTH DAILY FOR DIABETES  . lisinopril (ZESTRIL) 20 MG tablet TAKE 1 TABLET DAILY FOR BLODD PRESSURE & DIABETIC KIDNEY PROTECTION  . metFORMIN (GLUCOPHAGE-XR) 750 MG 24 hr tablet TAKE 1 TABLET 2 X /DAY WITH MEALS FOR DIABETES  . promethazine-dextromethorphan (PROMETHAZINE-DM) 6.25-15 MG/5ML syrup Take 5 mLs by mouth 4 (four) times daily as needed for cough.  . rivaroxaban (XARELTO) 20 MG TABS tablet Take   1 tablet Daily    to Prevent Blood Clots - Must have Office Visit before Refill   No current facility-administered medications on file prior to visit.    Health Maintenance:   Immunization History  Administered Date(s) Administered  . Influenza,inj,Quad PF,6+ Mos 09/06/2019  . PFIZER(Purple Top)SARS-COV-2 Vaccination 06/16/2020  . Pneumococcal-Unspecified 12/12/2003  . Tdap 06/10/2012   Tetanus: 2013 Pneumovax: 2005 Prevnar 13: NA Flu vaccine: declines Zostavax: NA  LMP: No LMP recorded. Patient is postmenopausal.  Pap: Dec 2020 follows with Philis Pique normal, neg HPV MGM: Dec 2020 Dr. Philis Pique, DUE DEXA: N/A Colonoscopy: 09/24/2020 EGD:  N/A Echo 06/2019 CTA 06/2019 [place] AB 2017 Last Dental Exam: Dental works, DUE Last Eye Exam: 5 years ago but states no issues at this time, DUE  Patient Care Team: Unk Pinto, MD as PCP - General (Internal Medicine)  Medical History:  Past Medical History:  Diagnosis Date  . Clotting disorder ([place])   . Fibrocystic breast   . Hyperlipidemia   . Hypertension   . Hypothyroidism   . Iron deficiency anemia   . Obesity    BMI 41  . Prediabetes   . Pulmonary embolism ([place]) 2006   from BCP  . Vitamin D deficiency    Allergies Allergies  Allergen Reactions  . Farxiga [Dapagliflozin] Anaphylaxis  . Crestor [Rosuvastatin]     Hair Loss   . Penicillins Rash    Did it involve  swelling of the face/tongue/throat, SOB, or low BP? No Did it involve sudden or severe rash/hives, skin peeling, or any reaction on the inside of your mouth or nose? No Did you need to seek medical attention at a hospital or doctor's office? No When did it last happen? If all above answers are "NO", may proceed with cephalosporin use.    SURGICAL HISTORY She  has a past surgical history that includes Tubal ligation (Bilateral); Tonsillectomy and adenoidectomy; Ablation (2006); and Thyroid surgery.   FAMILY HISTORY Her family history includes Cancer in her mother; Diabetes in her father; Osteoporosis in her mother.  SOCIAL HISTORY She  reports that she has never smoked. She has never used smokeless tobacco. She reports that she does not drink alcohol and does not use drugs.   Physical Exam: Estimated body mass index is 38.02 kg/m as calculated from the following:   Height as of 09/24/20: 5\' 10"  (1.778 m).   Weight as of 10/06/20: 265 lb (120.2 kg). There were no vitals taken for this visit.   General Appearance: Well nourished, in no apparent distress.  Eyes: PERRLA, EOMs, conjunctiva no swelling or erythema, normal fundi and vessels.  Sinuses: No Frontal/maxillary tenderness  ENT/Mouth: Ext aud canals clear, normal light reflex with TMs without erythema, bulging. Good dentition. No erythema, swelling, or exudate on post pharynx. Tonsils not swollen or erythematous. Hearing normal.  Neck: Supple, thyroid normal. No bruits  Respiratory: Respiratory effort normal, BS equal bilaterally without rales, rhonchi, wheezing or stridor.  Cardio: RRR without murmurs, rubs or gallops. Brisk peripheral pulses without edema.  Chest: symmetric, with normal excursions and percussion.  Breasts: defer Abdomen: Soft, nontender, obese, mild hepatomegaly,  no guarding, rebound, hernias, masses, or organomegaly.  Lymphatics: Non tender without lymphadenopathy.  Genitourinary: defer Musculoskeletal:  Full ROM all peripheral extremities,5/5 strength, and normal gait.  Skin: Left chest with scaly erythematous nodule. Warm, dry without rashes, lesions, ecchymosis. Neuro: Cranial nerves intact, reflexes equal bilaterally. Normal muscle tone, no cerebellar symptoms. Sensation intact.  Psych: Awake and oriented X 3, normal affect, Insight and Judgment appropriate.   EKG: defer Q2years  Bayard Males, DNP [facility] 03/17/2021  8:32 AM

## 2021-03-18 LAB — URINALYSIS W MICROSCOPIC + REFLEX CULTURE
Bilirubin Urine: NEGATIVE
Hgb urine dipstick: NEGATIVE
Hyaline Cast: NONE SEEN /LPF
Ketones, ur: NEGATIVE
Leukocyte Esterase: NEGATIVE
Nitrites, Initial: NEGATIVE
Protein, ur: NEGATIVE
RBC / HPF: NONE SEEN /HPF (ref 0–2)
Specific Gravity, Urine: 1.019 (ref 1.001–1.03)
pH: <=5 (ref 5.0–8.0)

## 2021-03-18 LAB — COMPLETE METABOLIC PANEL WITH GFR
AG Ratio: 1.4 (calc) (ref 1.0–2.5)
ALT: 40 U/L — ABNORMAL HIGH (ref 6–29)
AST: 29 U/L (ref 10–35)
Albumin: 4.1 g/dL (ref 3.6–5.1)
Alkaline phosphatase (APISO): 42 U/L (ref 37–153)
BUN: 14 mg/dL (ref 7–25)
CO2: 25 mmol/L (ref 20–32)
Calcium: 9.6 mg/dL (ref 8.6–10.4)
Chloride: 101 mmol/L (ref 98–110)
Creat: 0.83 mg/dL (ref 0.50–1.05)
GFR, Est African American: 91 mL/min/{1.73_m2} (ref >=60)
GFR, Est Non African American: 79 mL/min/{1.73_m2} (ref >=60)
Globulin: 3 g/dL (calc) (ref 1.9–3.7)
Glucose, Bld: 246 mg/dL — ABNORMAL HIGH (ref 65–99)
Potassium: 4.3 mmol/L (ref 3.5–5.3)
Sodium: 137 mmol/L (ref 135–146)
Total Bilirubin: 0.6 mg/dL (ref 0.2–1.2)
Total Protein: 7.1 g/dL (ref 6.1–8.1)

## 2021-03-18 LAB — CBC WITH DIFFERENTIAL/PLATELET
Absolute Monocytes: 388 cells/uL (ref 200–950)
Basophils Absolute: 51 cells/uL (ref 0–200)
Basophils Relative: 0.9 %
Eosinophils Absolute: 137 cells/uL (ref 15–500)
Eosinophils Relative: 2.4 %
HCT: 46.8 % — ABNORMAL HIGH (ref 35.0–45.0)
Hemoglobin: 15.9 g/dL — ABNORMAL HIGH (ref 11.7–15.5)
Lymphs Abs: 2582 cells/uL (ref 850–3900)
MCH: 30.9 pg (ref 27.0–33.0)
MCHC: 34 g/dL (ref 32.0–36.0)
MCV: 90.9 fL (ref 80.0–100.0)
MPV: 8.9 fL (ref 7.5–12.5)
Monocytes Relative: 6.8 %
Neutro Abs: 2542 cells/uL (ref 1500–7800)
Neutrophils Relative %: 44.6 %
Platelets: 251 10*3/uL (ref 140–400)
RBC: 5.15 10*6/uL — ABNORMAL HIGH (ref 3.80–5.10)
RDW: 13 % (ref 11.0–15.0)
Total Lymphocyte: 45.3 %
WBC: 5.7 10*3/uL (ref 3.8–10.8)

## 2021-03-18 LAB — VITAMIN D 25 HYDROXY (VIT D DEFICIENCY, FRACTURES): Vit D, 25-Hydroxy: 33 ng/mL (ref 30–100)

## 2021-03-18 LAB — LIPID PANEL
Cholesterol: 259 mg/dL — ABNORMAL HIGH (ref <200)
HDL: 53 mg/dL (ref >=50)
LDL Cholesterol (Calc): 168 mg/dL (calc) — ABNORMAL HIGH
Non-HDL Cholesterol (Calc): 206 mg/dL (calc) — ABNORMAL HIGH (ref <130)
Total CHOL/HDL Ratio: 4.9 (calc) (ref <5.0)
Triglycerides: 222 mg/dL — ABNORMAL HIGH (ref <150)

## 2021-03-18 LAB — VITAMIN B12: Vitamin B-12: 394 pg/mL (ref 200–1100)

## 2021-03-18 LAB — MICROALBUMIN / CREATININE URINE RATIO
Creatinine, Urine: 97 mg/dL (ref 20–275)
Microalb Creat Ratio: 6 mcg/mg creat (ref <30)
Microalb, Ur: 0.6 mg/dL

## 2021-03-18 LAB — MAGNESIUM: Magnesium: 1.6 mg/dL (ref 1.5–2.5)

## 2021-03-18 LAB — TSH: TSH: 2.28 m[IU]/L (ref 0.40–4.50)

## 2021-03-18 LAB — INSULIN, RANDOM: Insulin: 19.3 u[IU]/mL

## 2021-03-18 LAB — HEMOGLOBIN A1C
Hgb A1c MFr Bld: 9.6 % of total Hgb — ABNORMAL HIGH (ref <5.7)
Mean Plasma Glucose: 229 mg/dL
eAG (mmol/L): 12.7 mmol/L

## 2021-03-18 LAB — NO CULTURE INDICATED

## 2021-03-19 NOTE — Progress Notes (Signed)
-  Blood count is in normal range  -Electrolytes, kidney and liver function are in normal range. Liver enzymes slightly elevated, will monitor.  -Your cholesterol:  Not to goal.  LDL increased from 145 to 168!  This increase risk of heart attack stroke and cholesterol build up which long term clogs arteries and contributes to vascular dementia.  Know you had hair loss with statins.  We need another agent to lower this.  We  will send in zetia, non-statin, to help lower cholesterol.  Take this once a day, can be taken with or without food.  -Your thyroid, TSH, is in normal range.  -Your Vitamin D low end of normal.  -Urine sample: glucose in urine related to elevated sugars.  Can also make you have to pee more often.  -Your B12: 394 low end of normal.  Would benefit from B12 supplement, get one that melts in your mouth for better absorption.  -A1c, looking at glucose in your blood over the past three months increased to 9.6.  Slowly introduce metformin as we discussed.  We should also consider a once a week injectable to help lower blood glucose.  Trulicity or Ozempic would be a good fit for you.  These can also help with weight loss.  Biggest side effect is constipation (may be a bonus for you) and mild nausea.  What are you thoughts on this?  Let us know if you have any questions or concerns.   Sincerely,        Garnet Sierras, NP

## 2021-04-01 ENCOUNTER — Other Ambulatory Visit: Payer: Self-pay | Admitting: Internal Medicine

## 2021-06-25 IMAGING — CT CT ANGIOGRAPHY CHEST
2 of 7 series · 17 of 46 positions shown · IV contrast (APPLIED)
Comparison: Chest radiographs obtained earlier today. Chest CTA
report dated 01/21/2005.

CLINICAL DATA: Shortness of breath today. History of DVT and
pulmonary embolism.

EXAM:
CT ANGIOGRAPHY CHEST WITH CONTRAST
TECHNIQUE: Multidetector CT imaging of the chest was performed using the
standard protocol during bolus administration of intravenous
contrast. Multiplanar CT image reconstructions and MIPs were
obtained to evaluate the vascular anatomy.
CONTRAST:  100mL OMNIPAQUE IOHEXOL 350 MG/ML SOLN

[Series 7: thins · axial · 0.71mm/px · z∈[+1255,+1452]mm · 14 of 318 slices shown]
[im 18/318  lung]
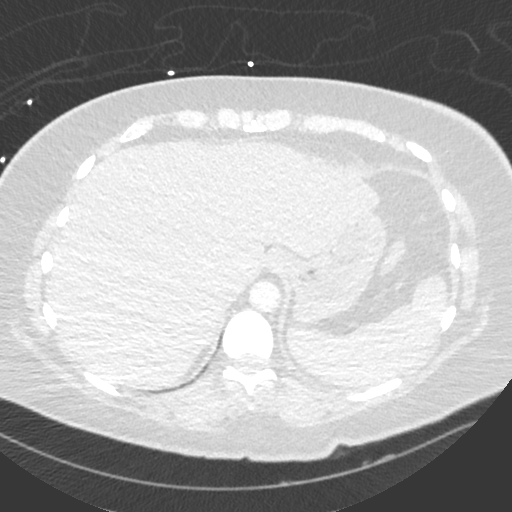
[im 36/318  soft-tissue]
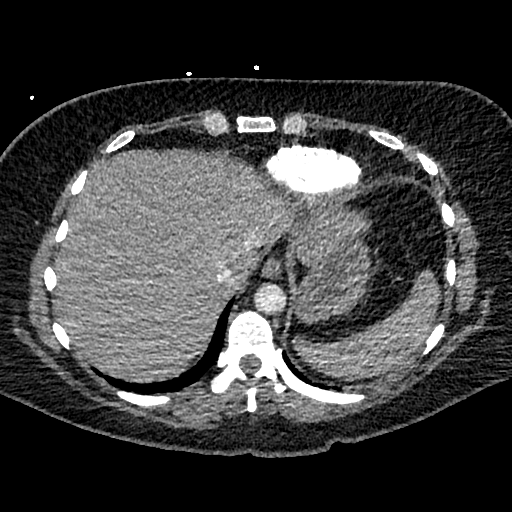
[im 71/318  lung]
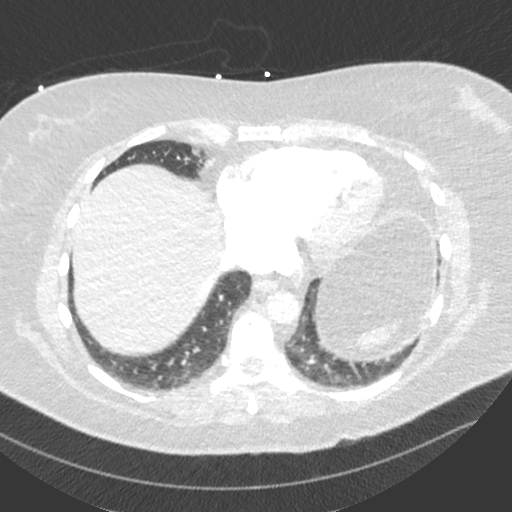
[im 89/318  soft-tissue]
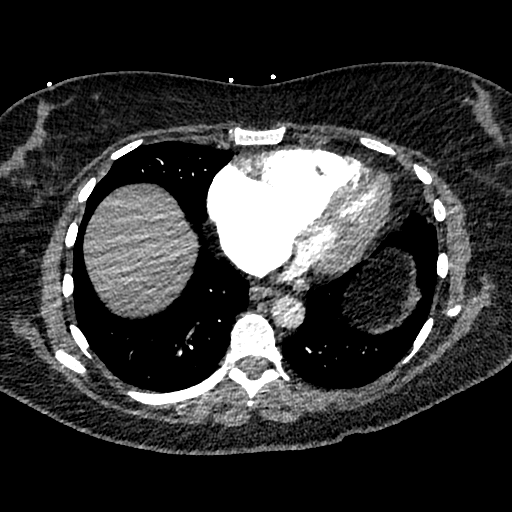
[im 106/318  lung]
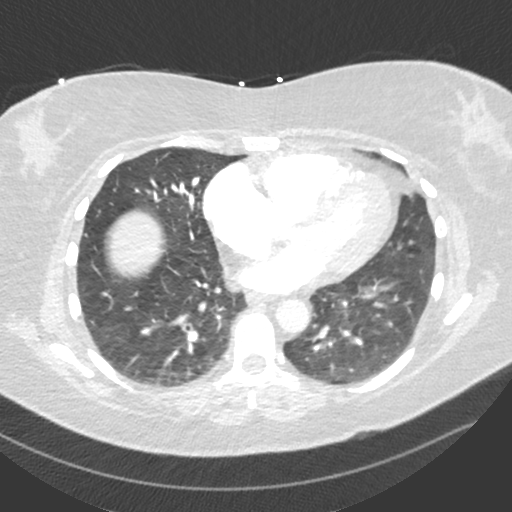
[im 124/318  soft-tissue]
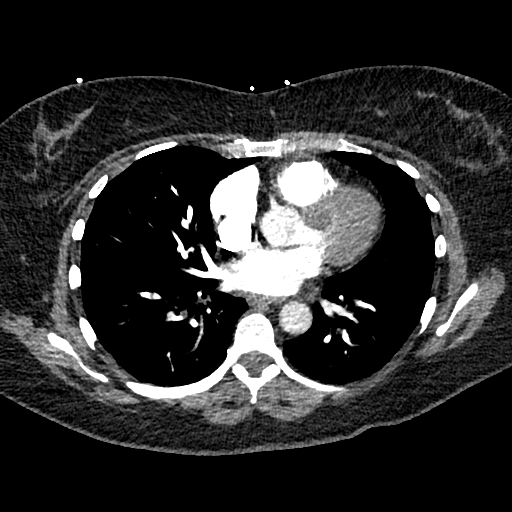
[im 141/318  lung]
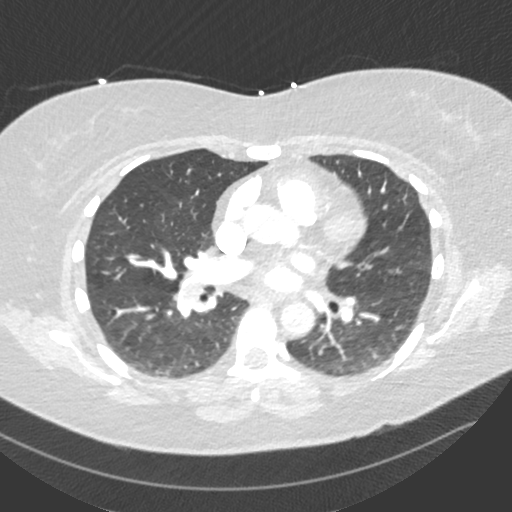
[im 177/318  soft-tissue]
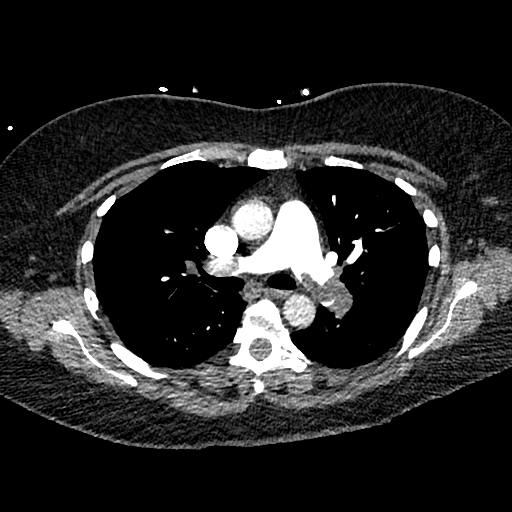
[im 194/318  lung]
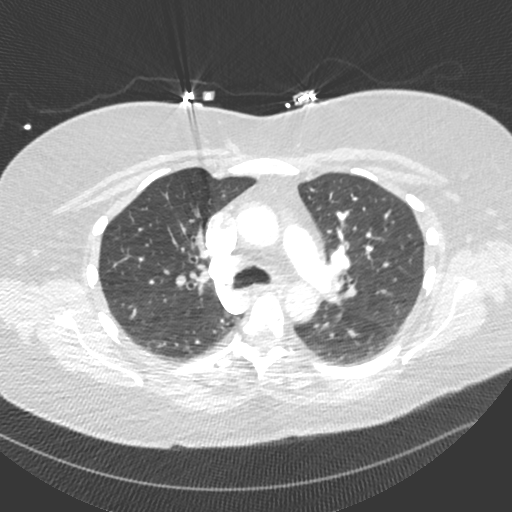
[im 212/318  soft-tissue]
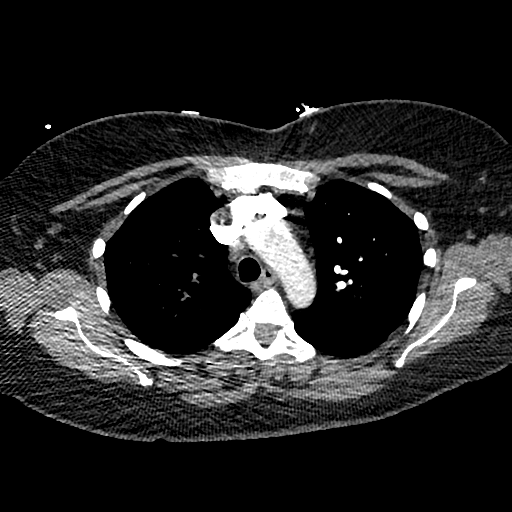
[im 229/318  lung]
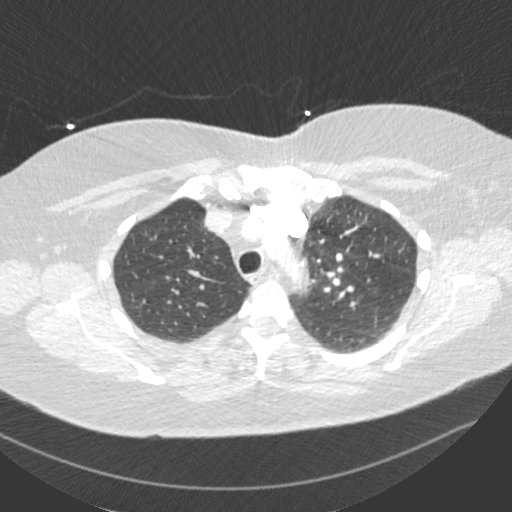
[im 247/318  soft-tissue]
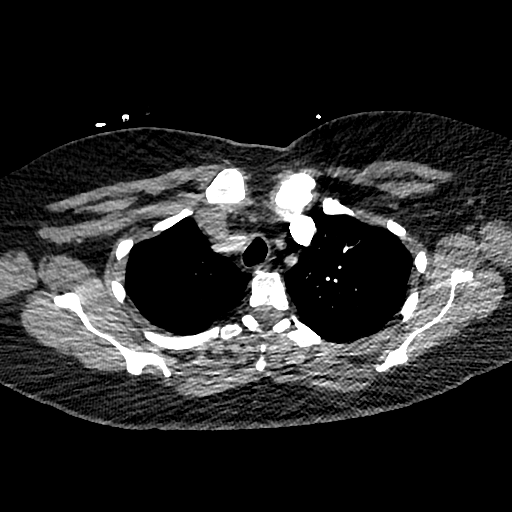
[im 282/318  lung]
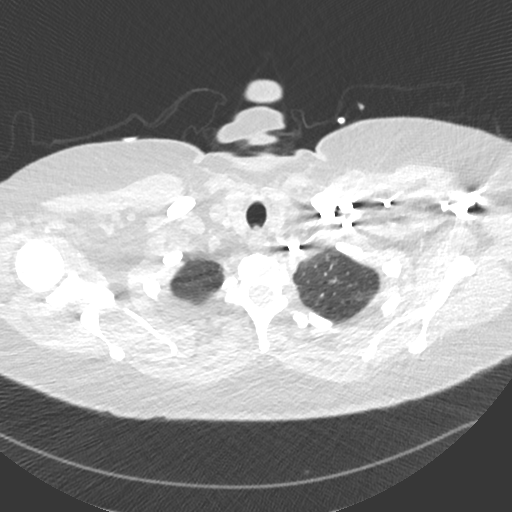
[im 300/318  soft-tissue]
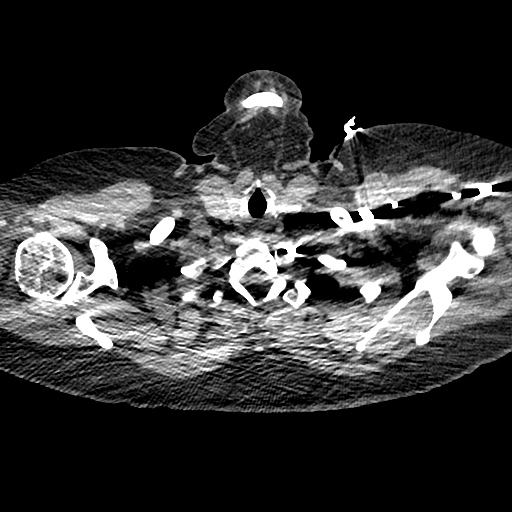

[Series 8: cor · coronal · 0.43mm/px · 3 of 139 slices shown]
[im 35/139  soft-tissue]
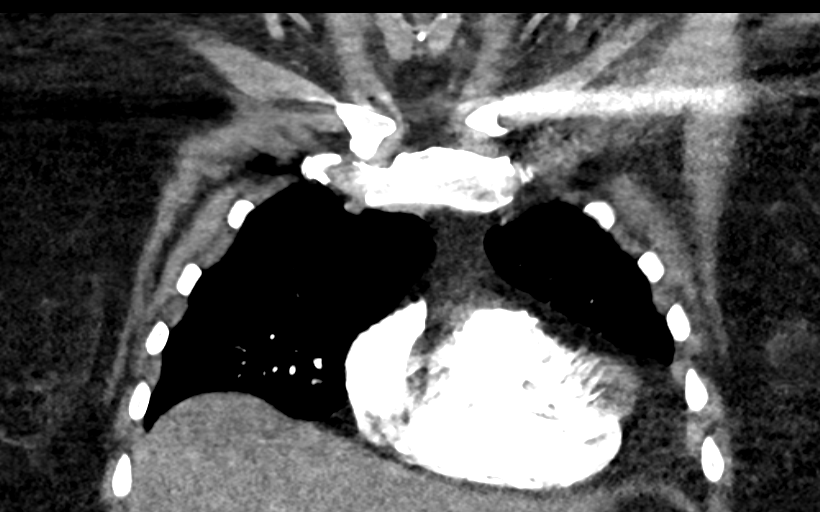
[im 70/139  soft-tissue]
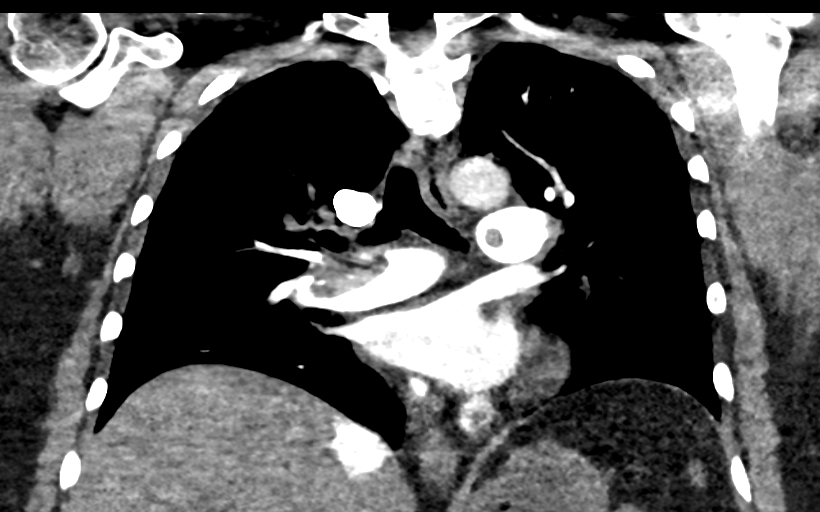
[im 104/139  soft-tissue]
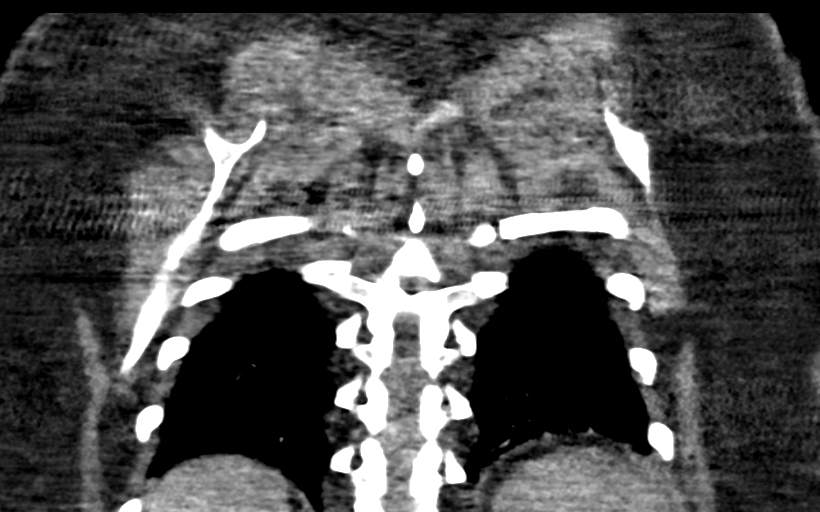

[17 of 46 positions shown; findings below may reference images not displayed]

FINDINGS: Cardiovascular: Multiple large and moderate-sized bilateral
pulmonary arterial filling defects, including large defects in both
main pulmonary arteries, with almost complete occlusion of the left
main pulmonary artery. Associated enlargement of the right ventricle
and right atrium a right ventricular to left ventricular ratio of
2.3.

Mediastinum/Nodes: No enlarged mediastinal, hilar, or axillary lymph
nodes. Thyroid gland, trachea, and esophagus demonstrate no
significant findings.

Lungs/Pleura: Lungs are clear. No pleural effusion or pneumothorax.

Upper Abdomen: Refluxed contrast into the inferior vena cava and
hepatic veins.

Musculoskeletal: Mild thoracic spine degenerative changes.

Review of the MIP images confirms the above findings.
IMPRESSION: Massive bilateral pulmonary emboli with marked right heart strain
with a right ventricular to left ventricular ratio of 2.3. There is
associated reflux of contrast into the inferior vena cava and
hepatic veins. The presence of right heart strain has been
associated with an increased risk of morbidity and mortality. Please
activate Code PE by paging [phone].

Critical Value/emergent results were called by telephone at the time
of interpretation on 06/30/2019 at [DATE] to Dr. Kurmis, who
verbally acknowledged these results.

## 2021-07-25 ENCOUNTER — Ambulatory Visit: Payer: No Typology Code available for payment source | Admitting: Adult Health

## 2021-08-09 ENCOUNTER — Encounter: Payer: Self-pay | Admitting: Adult Health

## 2021-08-09 ENCOUNTER — Ambulatory Visit (INDEPENDENT_AMBULATORY_CARE_PROVIDER_SITE_OTHER): Payer: No Typology Code available for payment source | Admitting: Adult Health

## 2021-08-09 ENCOUNTER — Other Ambulatory Visit: Payer: Self-pay

## 2021-08-09 VITALS — BP 138/88 | HR 98 | Temp 97.9°F | Wt 263.0 lb

## 2021-08-09 DIAGNOSIS — E669 Obesity, unspecified: Secondary | ICD-10-CM

## 2021-08-09 DIAGNOSIS — E559 Vitamin D deficiency, unspecified: Secondary | ICD-10-CM

## 2021-08-09 DIAGNOSIS — D509 Iron deficiency anemia, unspecified: Secondary | ICD-10-CM

## 2021-08-09 DIAGNOSIS — N183 Chronic kidney disease, stage 3 unspecified: Secondary | ICD-10-CM

## 2021-08-09 DIAGNOSIS — J0100 Acute maxillary sinusitis, unspecified: Secondary | ICD-10-CM

## 2021-08-09 DIAGNOSIS — N182 Chronic kidney disease, stage 2 (mild): Secondary | ICD-10-CM

## 2021-08-09 DIAGNOSIS — E1122 Type 2 diabetes mellitus with diabetic chronic kidney disease: Secondary | ICD-10-CM

## 2021-08-09 DIAGNOSIS — E1169 Type 2 diabetes mellitus with other specified complication: Secondary | ICD-10-CM | POA: Diagnosis not present

## 2021-08-09 DIAGNOSIS — E785 Hyperlipidemia, unspecified: Secondary | ICD-10-CM

## 2021-08-09 DIAGNOSIS — I10 Essential (primary) hypertension: Secondary | ICD-10-CM | POA: Diagnosis not present

## 2021-08-09 MED ORDER — DOXYCYCLINE HYCLATE 100 MG PO CAPS: ORAL_CAPSULE | ORAL | 0 refills | Status: DC

## 2021-08-09 NOTE — Patient Instructions (Signed)
Semaglutide injection solution What is this medication? SEMAGLUTIDE (Sem a GLOO tide) is used to improve blood sugar control in adults with type 2 diabetes. This medicine may be used with other diabetes medicines. This drug may also reduce the risk of heart attack or stroke if you have type 2diabetes and risk factors for heart disease. This medicine may be used for other purposes; ask your health care provider orpharmacist if you have questions. COMMON BRAND NAME(S): OZEMPIC What should I tell my care team before I take this medication? They need to know if you have any of these conditions: endocrine tumors (MEN 2) or if someone in your family had these tumors eye disease, vision problems history of pancreatitis kidney disease stomach problems thyroid cancer or if someone in your family had thyroid cancer an unusual or allergic reaction to semaglutide, other medicines, foods, dyes, or preservatives pregnant or trying to get pregnant breast-feeding How should I use this medication? This medicine is for injection under the skin of your upper leg (thigh), stomach area, or upper arm. It is given once every week (every 7 days). You will be taught how to prepare and give this medicine. Use exactly as directed. Take your medicine at regular intervals. Do not take it more often thandirected. If you use this medicine with insulin, you should inject this medicine and the insulin separately. Do not mix them together. Do not give the injections rightnext to each other. Change (rotate) injection sites with each injection. It is important that you put your used needles and syringes in a special sharps container. Do not put them in a trash can. If you do not have a sharpscontainer, call your pharmacist or healthcare provider to get one. A special MedGuide will be given to you by the pharmacist with eachprescription and refill. Be sure to read this information carefully each time. This drug comes with  INSTRUCTIONS FOR USE. Ask your pharmacist for directions on how to use this drug. Read the information carefully. Talk to yourpharmacist or health care provider if you have questions. Talk to your pediatrician regarding the use of this medicine in children.Special care may be needed. Overdosage: If you think you have taken too much of this medicine contact apoison control center or emergency room at once. NOTE: This medicine is only for you. Do not share this medicine with others. What if I miss a dose? If you miss a dose, take it as soon as you can within 5 days after the missed dose. Then take your next dose at your regular weekly time. If it has been longer than 5 days after the missed dose, do not take the missed dose. Take the next dose at your regular time. Do not take double or extra doses. If you havequestions about a missed dose, contact your health care provider for advice. What may interact with this medication? other medicines for diabetes Many medications may cause changes in blood sugar, these include: alcohol containing beverages antiviral medicines for HIV or AIDS aspirin and aspirin-like drugs certain medicines for blood pressure, heart disease, irregular heart beat chromium diuretics female hormones, such as estrogens or progestins, birth control pills fenofibrate gemfibrozil isoniazid lanreotide female hormones or anabolic steroids MAOIs like Carbex, Eldepryl, Marplan, Nardil, and Parnate medicines for weight loss medicines for allergies, asthma, cold, or cough medicines for depression, anxiety, or psychotic disturbances niacin nicotine NSAIDs, medicines for pain and inflammation, like ibuprofen or naproxen octreotide pasireotide pentamidine phenytoin probenecid quinolone antibiotics such as ciprofloxacin, levofloxacin, ofloxacin  some herbal dietary supplements steroid medicines such as prednisone or cortisone sulfamethoxazole; trimethoprim thyroid hormones Some  medications can hide the warning symptoms of low blood sugar (hypoglycemia). You may need to monitor your blood sugar more closely if youare taking one of these medications. These include: beta-blockers, often used for high blood pressure or heart problems (examples include atenolol, metoprolol, propranolol) clonidine guanethidine reserpine This list may not describe all possible interactions. Give your health care provider a list of all the medicines, herbs, non-prescription drugs, or dietary supplements you use. Also tell them if you smoke, drink alcohol, or use illegaldrugs. Some items may interact with your medicine. What should I watch for while using this medication? Visit your doctor or health care professional for regular checks on yourprogress. Drink plenty of fluids while taking this medicine. Check with your doctor or health care professional if you get an attack of severe diarrhea, nausea, and vomiting. The loss of too much body fluid can make it dangerous for you to takethis medicine. A test called the HbA1C (A1C) will be monitored. This is a simple blood test. It measures your blood sugar control over the last 2 to 3 months. You willreceive this test every 3 to 6 months. Learn how to check your blood sugar. Learn the symptoms of low and high bloodsugar and how to manage them. Always carry a quick-source of sugar with you in case you have symptoms of low blood sugar. Examples include hard sugar candy or glucose tablets. Make sure others know that you can choke if you eat or drink when you develop serious symptoms of low blood sugar, such as seizures or unconsciousness. They must getmedical help at once. Tell your doctor or health care professional if you have high blood sugar. You might need to change the dose of your medicine. If you are sick or exercisingmore than usual, you might need to change the dose of your medicine. Do not skip meals. Ask your doctor or health care professional if  you should avoid alcohol. Many nonprescription cough and cold products contain sugar oralcohol. These can affect blood sugar. Pens should never be shared. Even if the needle is changed, sharing may resultin passing of viruses like hepatitis or HIV. Wear a medical ID bracelet or chain, and carry a card that describes yourdisease and details of your medicine and dosage times. Do not become pregnant while taking this medicine. Women should inform their doctor if they wish to become pregnant or think they might be pregnant. There is a potential for serious side effects to an unborn child. Talk to your healthcare professional or pharmacist for more information. What side effects may I notice from receiving this medication? Side effects that you should report to your doctor or health care professionalas soon as possible: allergic reactions like skin rash, itching or hives, swelling of the face, lips, or tongue breathing problems changes in vision diarrhea that continues or is severe lump or swelling on the neck severe nausea signs and symptoms of infection like fever or chills; cough; sore throat; pain or trouble passing urine signs and symptoms of low blood sugar such as feeling anxious, confusion, dizziness, increased hunger, unusually weak or tired, sweating, shakiness, cold, irritable, headache, blurred vision, fast heartbeat, loss of consciousness signs and symptoms of kidney injury like trouble passing urine or change in the amount of urine trouble swallowing unusual stomach upset or pain vomiting Side effects that usually do not require medical attention (report to yourdoctor or health care professional  if they continue or are bothersome): constipation diarrhea nausea pain, redness, or irritation at site where injected stomach upset This list may not describe all possible side effects. Call your doctor for medical advice about side effects. You may report side effects to FDA  at1-800-FDA-1088. Where should I keep my medication? Keep out of the reach of children. Store unopened pens in a refrigerator between 2 and 8 degrees C (36 and 46 degrees F). Do not freeze. Protect from light and heat. After you first use the pen, it can be stored for 56 days at room temperature between 15 and 30 degrees C (59 and 86 degrees F) or in a refrigerator. Throw away your used pen after 56days or after the expiration date, whichever comes first. Do not store your pen with the needle attached. If the needle is left on,medicine may leak from the pen. NOTE: This sheet is a summary. It may not cover all possible information. If you have questions about this medicine, talk to your doctor, pharmacist, orhealth care provider.  2022 Elsevier/Gold Standard (2019-08-12 09:41:51)

## 2021-08-09 NOTE — Progress Notes (Signed)
FOLLOW UP 3 MONTH   Assessment and Plan:   Essential hypertension - continue medications, DASH diet, exercise and monitor at home. Call if greater than 130/80.  - CBC with Differential/Platelet - CMP/GFR  Hyperlipidemia associated with T2DM ([place]) - declines chol medication, hx of hair loss with statin - discussed risks of cardiovascular disease, particularly with T2DM Patient very motivated to be healthier, working on lifestyle Would consider CT coronary calcium scan in the future, declines at this visit -reviewed low saturated fat, high fiber diet  - may add on GLP for weight loss   CKD stage 3 due to type 2 diabetes mellitus (HCC) -     Hemoglobin A1c -     CMP/GFR - continue medications Increase water  Type 2 diabetes mellitus with stage 3 chronic kidney disease, without long-term current use of insulin (HCC) -     Hemoglobin A1c Discussed general issues about diabetes pathophysiology and management., Educational material distributed., Suggested low cholesterol diet., Encouraged aerobic exercise Continue jardiance, intolerant of metformin even low doses May benefit from GLP1ra, receptive to ozempic after discussion, plan pending A1C if 8+   History of pulmonary embolus (PE) Continue xarelto, working on increasing exercise, weight loss advised  Hypothyroidism, unspecified hypothyroidism type Hx of Hashimoto's, improved, off medication, will monitor labs.  - TSH  Morbid obesity, unspecified obesity type ([place]) Long discussion about weight loss, diet, and exercise Recommended diet heavy in fruits and veggies and low in animal meats, cheeses, and dairy products, appropriate calorie intake Consider GLP-1 RA Follow up at next visit   Vitamin D deficiency - Continue supplement    Discussed med's effects and SE's. Screening labs and tests as requested with regular follow-up as recommended. Future Appointments  Date Time Provider [place]  11/28/2021  4:00 PM  Magda Bernheim, NP [facility] None  03/20/2022  9:00 AM Magda Bernheim, NP [facility] None    HPI  55 y.o. female  presents for a 3 month follow up on diet controlled hypertension, and HLD, DMII, history of PE on xarelto '20mg'$  daily.  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 Trying to improve this, manager is supportive.   BMI is Body mass index is 37.74 kg/m., she has been working on diet and exercise, has started a daily exercise video program. She admits hasn't been making the best dietary choices. She has been pushing water intake.  Wt Readings from Last 3 Encounters:  08/09/21 263 lb (119.3 kg)  10/06/20 265 lb (120.2 kg)  09/24/20 268 lb (121.6 kg)    Her blood pressure has been controlled at home, today their BP is BP: 138/88  She does workout, She denies chest pain, shortness of breath, dizziness.   She is not on cholesterol medication, she states with crestor she had hair falling out, resistant to start cholesterol med at this point, but more motivated to work on lifestyle change. Cholesterol became much worse after menopause. Denies significant CVD family hx. Discussed screening risk with  CT coronary calcium, would consider in the future. Her cholesterol is not at goal. The cholesterol last visit was:  Lab Results  Component Value Date   CHOL 259 (H) 03/17/2021   HDL 53 03/17/2021   LDLCALC 168 (H) 03/17/2021   TRIG 222 (H) 03/17/2021   CHOLHDL 4.9 03/17/2021    She has been working on diet and exercise for diabetes  she did not tolerate farxiga due to nasopharyngitis symptoms - she is on jardiance '25mg'$  and tolerating.  Metformin was prescribed 750 mg BID but stopped - even 1 tab a day was causing diarrhea/upset stomach  NOT checking her sugars at home but has a monitor from work.   and denies paresthesia of the feet, polydipsia, polyuria and visual disturbances. She would be receptive to  [facility] for weight loss benefits  Last A1C in the office was: Lab Results  Component Value Date   HGBA1C 9.6 (H) 03/17/2021   CKD II , on lisinopril - last GFR:  Lab Results  Component Value Date   GFRNONAA 79 03/17/2021   GFRNONAA 66 10/06/2020   GFRNONAA 64 03/05/2020   Patient is on Vitamin D supplement, 5000 a day but not consistenly  Lab Results  Component Value Date   VD25OH 17 03/17/2021   She is not on thyroid medication, remotely was on for several years following Hashimoto's but normalized.  Lab Results  Component Value Date   TSH 2.28 03/17/2021    Current Medications:  Current Outpatient Medications on File Prior to Visit  Medication Sig   acetaminophen (TYLENOL) 500 MG tablet Take 1,000 mg by mouth every 6 (six) hours as needed for mild pain.   ASHWAGANDHA PO Take 300 mg by mouth.   Biotin 10000 MCG TABS Take by mouth daily.   Calcium Carbonate-Vitamin D (CALCIUM-VITAMIN D3 PO) Take 500 mg by mouth daily.   cetirizine (ZYRTEC) 10 MG tablet Take 10 mg by mouth daily.   Cholecalciferol 5000 units capsule Take 5,000 Units by mouth daily.   empagliflozin (JARDIANCE) 25 MG TABS tablet TAKE 1 TABLET BY MOUTH DAILY FOR DIABETES   famotidine (PEPCID) 20 MG tablet Take 1 tablet 2 x /day as needed for Acid Indigestion & Reflux   lisinopril (ZESTRIL) 20 MG tablet TAKE 1 TABLET DAILY FOR BLODD PRESSURE & DIABETIC KIDNEY PROTECTION   OVER THE COUNTER MEDICATION [place]   XARELTO 20 MG TABS tablet TAKE 1 TABLET DAILY TO PREVENT BLOOD CLOTS - MUST HAVE OFFICE VISIT BEFORE REFILL   metFORMIN (GLUCOPHAGE-XR) 750 MG 24 hr tablet TAKE 1 TABLET 2 X /DAY WITH MEALS FOR DIABETES (Patient not taking: Reported on 08/09/2021)   No current facility-administered medications on file prior to visit.    Medical History:  Past Medical History:  Diagnosis Date   Clotting disorder ([place])    Fibrocystic breast    Hyperlipidemia    Hypertension    Hypothyroidism    Iron deficiency  anemia    Obesity    BMI 41   Prediabetes    Pulmonary embolism ([place]) 2006   from BCP   Vitamin D deficiency    Allergies Allergies  Allergen Reactions   Farxiga [Dapagliflozin] Other (See Comments)    Severe sinus infeciton   Crestor [Rosuvastatin]     Hair Loss    Penicillins Rash    Did it involve swelling of the face/tongue/throat, SOB, or low BP? No Did it involve sudden or severe rash/hives, skin peeling, or any reaction on the inside of your mouth or nose? No Did you need to seek medical attention at a hospital or doctor's office? No When did it last happen?       If all above answers are "NO", may proceed with cephalosporin use.    SURGICAL HISTORY She  has a past surgical history that includes Tubal ligation (Bilateral); Tonsillectomy and adenoidectomy; Ablation (2006); and Thyroid surgery.  FAMILY HISTORY Her family history includes Cancer in her mother; Diabetes in her father; Osteoporosis in her mother.  SOCIAL HISTORY She  reports that she has never smoked. She has never used smokeless tobacco. She reports that she does not drink alcohol and does not use drugs.  Review of Systems  Constitutional:  Negative for malaise/fatigue and weight loss.  HENT:  Positive for congestion and sinus pain. Negative for hearing loss, nosebleeds, sore throat and tinnitus.   Eyes:  Negative for blurred vision and double vision.  Respiratory:  Negative for cough, shortness of breath and wheezing.   Cardiovascular:  Negative for chest pain, palpitations, orthopnea, claudication and leg swelling.  Gastrointestinal:  Negative for abdominal pain, blood in stool, constipation, diarrhea, heartburn, melena, nausea and vomiting.  Genitourinary: Negative.   Musculoskeletal:  Negative for joint pain and myalgias.  Skin:  Negative for rash.  Neurological:  Negative for dizziness, tingling, sensory change, weakness and headaches.  Endo/Heme/Allergies:  Negative for polydipsia.   Psychiatric/Behavioral: Negative.    All other systems reviewed and are negative.  Physical Exam: Estimated body mass index is 37.74 kg/m as calculated from the following:   Height as of 09/24/20: '5\' 10"'$  (1.778 m).   Weight as of this encounter: 263 lb (119.3 kg). BP 138/88   Pulse 98   Temp 97.9 F (36.6 C)   Wt 263 lb (119.3 kg)   SpO2 96%   BMI 37.74 kg/m  General Appearance: Well nourished, in no apparent distress.  Eyes: PERRLA, EOMs, conjunctiva no swelling or erythema Sinuses: No Frontal/maxillary tenderness  ENT/Mouth: Ext aud canals clear, normal light reflex with TMs without erythema, bulging. Good dentition. No erythema, swelling, or exudate on post pharynx. Tonsils not swollen or erythematous. Hearing normal.  Neck: Supple, thyroid normal. No bruits  Respiratory: Respiratory effort normal, BS equal bilaterally without rales, rhonchi, wheezing or stridor.  Cardio: RRR without murmurs, rubs or gallops. Brisk peripheral pulses without edema.  Abdomen: Soft, nontender, obese, no guarding. Lymphatics: Non tender without lymphadenopathy.  Musculoskeletal: Full ROM all peripheral extremities,5/5 strength, and normal gait.  Skin: Warm, dry without rashes, lesions, ecchymosis. Neuro: Cranial nerves intact, reflexes equal bilaterally. Normal muscle tone, no cerebellar symptoms. Sensation intact.  Psych: Awake and oriented X 3, normal affect, Insight and Judgment appropriate.   Izora Ribas, NP 4:07 PM [facility]

## 2021-08-10 ENCOUNTER — Encounter: Payer: Self-pay | Admitting: Adult Health

## 2021-08-10 DIAGNOSIS — D751 Secondary polycythemia: Secondary | ICD-10-CM | POA: Insufficient documentation

## 2021-08-11 LAB — LIPID PANEL
Cholesterol: 263 mg/dL — ABNORMAL HIGH (ref <200)
HDL: 53 mg/dL (ref >=50)
LDL Cholesterol (Calc): 158 mg/dL (calc) — ABNORMAL HIGH
Non-HDL Cholesterol (Calc): 210 mg/dL (calc) — ABNORMAL HIGH (ref <130)
Total CHOL/HDL Ratio: 5 (calc) — ABNORMAL HIGH (ref <5.0)
Triglycerides: 336 mg/dL — ABNORMAL HIGH (ref <150)

## 2021-08-11 LAB — HEMOGLOBIN A1C
Hgb A1c MFr Bld: 8.9 % of total Hgb — ABNORMAL HIGH (ref <5.7)
Mean Plasma Glucose: 209 mg/dL
eAG (mmol/L): 11.6 mmol/L

## 2021-08-11 LAB — CBC WITH DIFFERENTIAL/PLATELET
Absolute Monocytes: 510 cells/uL (ref 200–950)
Basophils Absolute: 49 cells/uL (ref 0–200)
Basophils Relative: 0.6 %
Eosinophils Absolute: 130 cells/uL (ref 15–500)
Eosinophils Relative: 1.6 %
HCT: 49.1 % — ABNORMAL HIGH (ref 35.0–45.0)
Hemoglobin: 16.8 g/dL — ABNORMAL HIGH (ref 11.7–15.5)
Lymphs Abs: 3596 cells/uL (ref 850–3900)
MCH: 30.9 pg (ref 27.0–33.0)
MCHC: 34.2 g/dL (ref 32.0–36.0)
MCV: 90.4 fL (ref 80.0–100.0)
MPV: 8.9 fL (ref 7.5–12.5)
Monocytes Relative: 6.3 %
Neutro Abs: 3815 cells/uL (ref 1500–7800)
Neutrophils Relative %: 47.1 %
Platelets: 275 10*3/uL (ref 140–400)
RBC: 5.43 10*6/uL — ABNORMAL HIGH (ref 3.80–5.10)
RDW: 12.3 % (ref 11.0–15.0)
Total Lymphocyte: 44.4 %
WBC: 8.1 10*3/uL (ref 3.8–10.8)

## 2021-08-11 LAB — TEST AUTHORIZATION

## 2021-08-11 LAB — COMPLETE METABOLIC PANEL WITH GFR
AG Ratio: 1.4 (calc) (ref 1.0–2.5)
ALT: 30 U/L — ABNORMAL HIGH (ref 6–29)
AST: 21 U/L (ref 10–35)
Albumin: 4.4 g/dL (ref 3.6–5.1)
Alkaline phosphatase (APISO): 46 U/L (ref 37–153)
BUN: 20 mg/dL (ref 7–25)
CO2: 26 mmol/L (ref 20–32)
Calcium: 9.8 mg/dL (ref 8.6–10.4)
Chloride: 101 mmol/L (ref 98–110)
Creat: 0.97 mg/dL (ref 0.50–1.03)
Globulin: 3.2 g/dL (calc) (ref 1.9–3.7)
Glucose, Bld: 162 mg/dL — ABNORMAL HIGH (ref 65–99)
Potassium: 3.7 mmol/L (ref 3.5–5.3)
Sodium: 137 mmol/L (ref 135–146)
Total Bilirubin: 0.5 mg/dL (ref 0.2–1.2)
Total Protein: 7.6 g/dL (ref 6.1–8.1)
eGFR: 68 mL/min/{1.73_m2} (ref >=60)

## 2021-08-11 LAB — MAGNESIUM: Magnesium: 2.1 mg/dL (ref 1.5–2.5)

## 2021-08-11 LAB — PATHOLOGIST SMEAR REVIEW

## 2021-08-11 LAB — TSH: TSH: 2.42 mIU/L (ref 0.40–4.50)

## 2021-09-03 ENCOUNTER — Other Ambulatory Visit: Payer: Self-pay | Admitting: Adult Health

## 2021-10-01 ENCOUNTER — Other Ambulatory Visit: Payer: Self-pay | Admitting: Adult Health

## 2021-10-24 ENCOUNTER — Other Ambulatory Visit: Payer: Self-pay | Admitting: Adult Health Nurse Practitioner

## 2021-10-24 DIAGNOSIS — E1169 Type 2 diabetes mellitus with other specified complication: Secondary | ICD-10-CM

## 2021-10-24 DIAGNOSIS — E785 Hyperlipidemia, unspecified: Secondary | ICD-10-CM

## 2021-11-23 NOTE — Progress Notes (Deleted)
FOLLOW UP 3 MONTH   Assessment and Plan:   Essential hypertension - continue medications, DASH diet, exercise and monitor at home. Call if greater than 130/80.  - CBC with Differential/Platelet - CMP/GFR  Hyperlipidemia associated with T2DM ([place]) - declines chol medication, hx of hair loss with statin - discussed risks of cardiovascular disease, particularly with T2DM Patient very motivated to be healthier, working on lifestyle Would consider CT coronary calcium scan in the future, declines at this visit -reviewed low saturated fat, high fiber diet  - may add on GLP for weight loss   CKD stage  due to type 2 diabetes mellitus (HCC) -     Hemoglobin A1c -     CMP/GFR - continue medications Increase water  Type 2 diabetes mellitus with stage 3 chronic kidney disease, without long-term current use of insulin (HCC) -     Hemoglobin A1c Discussed general issues about diabetes pathophysiology and management., Educational material distributed., Suggested low cholesterol diet., Encouraged aerobic exercise Continue jardiance, intolerant of metformin even low doses May benefit from GLP1ra, receptive to ozempic after discussion, plan pending A1C if 8+   History of pulmonary embolus (PE) Continue xarelto, working on increasing exercise, weight loss advised  Hypothyroidism, unspecified hypothyroidism type Hx of Hashimoto's, improved, off medication, will monitor labs.  - TSH  Morbid obesity, unspecified obesity type ([place]) Long discussion about weight loss, diet, and exercise Recommended diet heavy in fruits and veggies and low in animal meats, cheeses, and dairy products, appropriate calorie intake Consider GLP-1 RA Follow up at next visit   Vitamin D deficiency - Continue supplement    Discussed med's effects and SE's. Screening labs and tests as requested with regular follow-up as recommended. Future Appointments  Date Time Provider [place]  11/28/2021  4:00 PM Magda Bernheim, NP [facility] None  03/20/2022  9:00 AM Magda Bernheim, NP [facility] None    HPI  55 y.o. female  presents for a 3 month follow up on diet controlled hypertension, and HLD, DMII, history of PE on xarelto 20mg  daily.  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 Trying to improve this, manager is supportive.   BMI is There is no height or weight on file to calculate BMI., she has been working on diet and exercise, has started a daily exercise video program. She admits hasn't been making the best dietary choices. She has been pushing water intake.  Wt Readings from Last 3 Encounters:  08/09/21 263 lb (119.3 kg)  10/06/20 265 lb (120.2 kg)  09/24/20 268 lb (121.6 kg)    Her blood pressure has been controlled at home, today their BP is    She does workout, She denies chest pain, shortness of breath, dizziness.   She is not on cholesterol medication, she states with crestor she had hair falling out, resistant to start cholesterol med at this point, but more motivated to work on lifestyle change. Cholesterol became much worse after menopause. Denies significant CVD family hx. Discussed screening risk with  CT coronary calcium, would consider in the future. Her cholesterol is not at goal. The cholesterol last visit was:  Lab Results  Component Value Date   CHOL 263 (H) 08/09/2021   HDL 53 08/09/2021   LDLCALC 158 (H) 08/09/2021   TRIG 336 (H) 08/09/2021   CHOLHDL 5.0 (H) 08/09/2021    She has been working  on diet and exercise for diabetes she did not tolerate farxiga due to nasopharyngitis symptoms - she is on jardiance 25mg  and tolerating.  Metformin was prescribed 750 mg BID but stopped - even 1 tab a day was causing diarrhea/upset stomach  NOT checking her sugars at home but has a monitor from work.   and denies paresthesia of the feet, polydipsia, polyuria and visual disturbances. She would be  receptive to [facility] for weight loss benefits  Last A1C in the office was: Lab Results  Component Value Date   HGBA1C 8.9 (H) 08/09/2021   CKD II , on lisinopril - last GFR:  Lab Results  Component Value Date   GFRNONAA 79 03/17/2021   GFRNONAA 66 10/06/2020   GFRNONAA 64 03/05/2020   Patient is on Vitamin D supplement, 5000 a day but not consistenly  Lab Results  Component Value Date   VD25OH 106 03/17/2021   She is not on thyroid medication, remotely was on for several years following Hashimoto's but normalized.  Lab Results  Component Value Date   TSH 2.42 08/09/2021    Current Medications:  Current Outpatient Medications on File Prior to Visit  Medication Sig   acetaminophen (TYLENOL) 500 MG tablet Take 1,000 mg by mouth every 6 (six) hours as needed for mild pain.   ASHWAGANDHA PO Take 300 mg by mouth.   Biotin 10000 MCG TABS Take by mouth daily.   Calcium Carbonate-Vitamin D (CALCIUM-VITAMIN D3 PO) Take 500 mg by mouth daily.   cetirizine (ZYRTEC) 10 MG tablet Take 10 mg by mouth daily.   Cholecalciferol 5000 units capsule Take 5,000 Units by mouth daily.   doxycycline (VIBRAMYCIN) 100 MG capsule Take 1 capsule 2 x/day with food for 10 days.   famotidine (PEPCID) 20 MG tablet Take 1 tablet 2 x /day as needed for Acid Indigestion & Reflux   JARDIANCE 25 MG TABS tablet TAKE 1 TABLET BY MOUTH DAILY FOR DIABETES   lisinopril (ZESTRIL) 20 MG tablet TAKE 1 TABLET DAILY FOR BLODD PRESSURE & DIABETIC KIDNEY PROTECTION   metFORMIN (GLUCOPHAGE-XR) 750 MG 24 hr tablet TAKE 1 TABLET 2 X /DAY WITH MEALS FOR DIABETES (Patient not taking: Reported on 08/09/2021)   OVER THE COUNTER MEDICATION [place]   XARELTO 20 MG TABS tablet TAKE 1 TABLET DAILY TO PREVENT BLOOD CLOTS - MUST HAVE OFFICE VISIT BEFORE REFILL   No current facility-administered medications on file prior to visit.    Medical History:  Past Medical History:  Diagnosis Date   Clotting disorder ([place])     Fibrocystic breast    Hyperlipidemia    Hypertension    Hypothyroidism    Iron deficiency anemia    Obesity    BMI 41   Prediabetes    Pulmonary embolism ([place]) 2006   from BCP   Vitamin D deficiency    Allergies Allergies  Allergen Reactions   Farxiga [Dapagliflozin] Other (See Comments)    Severe sinus infeciton   Crestor [Rosuvastatin]     Hair Loss    Penicillins Rash    Did it involve swelling of the face/tongue/throat, SOB, or low BP? No Did it involve sudden or severe rash/hives, skin peeling, or any reaction on the inside of your mouth or nose? No Did you need to seek medical attention at a hospital or doctor's office? No When did it last happen?       If all above answers are NO, may proceed with cephalosporin use.    SURGICAL HISTORY  She  has a past surgical history that includes Tubal ligation (Bilateral); Tonsillectomy and adenoidectomy; Ablation (2006); and Thyroid surgery.   FAMILY HISTORY Her family history includes Cancer in her mother; Diabetes in her father; Osteoporosis in her mother.  SOCIAL HISTORY She  reports that she has never smoked. She has never used smokeless tobacco. She reports that she does not drink alcohol and does not use drugs.  Review of Systems  Constitutional:  Negative for malaise/fatigue and weight loss.  HENT:  Positive for congestion and sinus pain. Negative for hearing loss, nosebleeds, sore throat and tinnitus.   Eyes:  Negative for blurred vision and double vision.  Respiratory:  Negative for cough, shortness of breath and wheezing.   Cardiovascular:  Negative for chest pain, palpitations, orthopnea, claudication and leg swelling.  Gastrointestinal:  Negative for abdominal pain, blood in stool, constipation, diarrhea, heartburn, melena, nausea and vomiting.  Genitourinary: Negative.   Musculoskeletal:  Negative for joint pain and myalgias.  Skin:  Negative for rash.  Neurological:  Negative for dizziness, tingling, sensory  change, weakness and headaches.  Endo/Heme/Allergies:  Negative for polydipsia.  Psychiatric/Behavioral: Negative.    All other systems reviewed and are negative.  Physical Exam: Estimated body mass index is 37.74 kg/m as calculated from the following:   Height as of 09/24/20: 5\' 10"  (1.778 m).   Weight as of 08/09/21: 263 lb (119.3 kg). There were no vitals taken for this visit. General Appearance: Well nourished, in no apparent distress.  Eyes: PERRLA, EOMs, conjunctiva no swelling or erythema Sinuses: No Frontal/maxillary tenderness  ENT/Mouth: Ext aud canals clear, normal light reflex with TMs without erythema, bulging. Good dentition. No erythema, swelling, or exudate on post pharynx. Tonsils not swollen or erythematous. Hearing normal.  Neck: Supple, thyroid normal. No bruits  Respiratory: Respiratory effort normal, BS equal bilaterally without rales, rhonchi, wheezing or stridor.  Cardio: RRR without murmurs, rubs or gallops. Brisk peripheral pulses without edema.  Abdomen: Soft, nontender, obese, no guarding. Lymphatics: Non tender without lymphadenopathy.  Musculoskeletal: Full ROM all peripheral extremities,5/5 strength, and normal gait.  Skin: Warm, dry without rashes, lesions, ecchymosis. Neuro: Cranial nerves intact, reflexes equal bilaterally. Normal muscle tone, no cerebellar symptoms. Sensation intact.  Psych: Awake and oriented X 3, normal affect, Insight and Judgment appropriate.   Magda Bernheim, NP 12:42 PM [facility]

## 2021-11-28 ENCOUNTER — Ambulatory Visit: Payer: No Typology Code available for payment source | Admitting: Nurse Practitioner

## 2021-11-28 DIAGNOSIS — Z86711 Personal history of pulmonary embolism: Secondary | ICD-10-CM

## 2021-11-28 DIAGNOSIS — N183 Chronic kidney disease, stage 3 unspecified: Secondary | ICD-10-CM

## 2021-11-28 DIAGNOSIS — D582 Other hemoglobinopathies: Secondary | ICD-10-CM

## 2021-11-28 DIAGNOSIS — E039 Hypothyroidism, unspecified: Secondary | ICD-10-CM

## 2021-11-28 DIAGNOSIS — Z79899 Other long term (current) drug therapy: Secondary | ICD-10-CM

## 2021-11-28 DIAGNOSIS — E1122 Type 2 diabetes mellitus with diabetic chronic kidney disease: Secondary | ICD-10-CM

## 2021-11-28 DIAGNOSIS — E1169 Type 2 diabetes mellitus with other specified complication: Secondary | ICD-10-CM

## 2021-11-28 DIAGNOSIS — I10 Essential (primary) hypertension: Secondary | ICD-10-CM

## 2021-11-28 DIAGNOSIS — E559 Vitamin D deficiency, unspecified: Secondary | ICD-10-CM

## 2021-12-22 NOTE — Progress Notes (Signed)
FOLLOW UP    Assessment and Plan:   Essential hypertension - continue medications, DASH diet, exercise and monitor at home. Call if greater than 130/80.  - CBC with Differential/Platelet - CMP/GFR  Hyperlipidemia associated with T2DM ([place]) - declines chol medication, hx of hair loss with statin - discussed risks of cardiovascular disease, particularly with T2DM Patient very motivated to be healthier, working on lifestyle Would consider CT coronary calcium scan in the future, declines at this visit -reviewed low saturated fat, high fiber diet  - may add on GLP for weight loss   CKD stage 3 due to type 2 diabetes mellitus (HCC) -     Hemoglobin A1c -     CMP/GFR - continue medications Increase water  Type 2 diabetes mellitus with stage 3 chronic kidney disease, without long-term current use of insulin (HCC) -     Hemoglobin A1c Discussed general issues about diabetes pathophysiology and management., Educational material distributed., Suggested low cholesterol diet., Encouraged aerobic exercise Continue jardiance, intolerant of metformin even low doses May benefit from GLP1ra, plan Ozempic or Trulicity pending M0N if 8+   History of pulmonary embolus (PE) Continue xarelto, working on increasing exercise, weight loss advised  Hypothyroidism, unspecified hypothyroidism type Hx of Hashimoto's, improved, off medication, will monitor labs.  - TSH  Lumbar pain Exercise, weight loss Flexeril 5 mg tid as needed Monitor  Right trapezius muscle spasm Offered trigger point injection, pt declines Apply warm moist heat Flexeril 5 mg TID as needed Raise computer screen to avoid repetitive looking down  Morbid obesity, unspecified obesity type ([place]) Long discussion about weight loss, diet, and exercise Recommended diet heavy in fruits and veggies and low in animal meats, cheeses, and dairy products, appropriate calorie intake Consider GLP-1 RA Follow up at next visit   Vitamin D  deficiency - Continue supplement   Polycythemia Check CBC Check erythropoietin  Urinary frequency -UA Routine with reflex microscopic -Urine culture  Discussed med's effects and SE's. Screening labs and tests as requested with regular follow-up as recommended. Future Appointments  Date Time Provider [place]  03/20/2022  9:00 AM Magda Bernheim, NP [facility] None    HPI  56 y.o. female  presents for a 3 month follow up on diet controlled hypertension, and HLD, DMII, history of PE on xarelto 20mg  daily.  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 Trying to improve this, manager is supportive.   She has been experiencing sharp pain  in her right ear for 2 weeks.  Has been using Alka seltzer col and flu with minimal change in pain. She has been having some referred pain in her right jaw and down right side of neck  She does have persistent low back pain with spasms of muscles- had used Flexeril in the past with relief of symptoms  She has had 2 UTI in the past 2 months.  Currently does notice urinary frequency.  Denies hematuria, dysuria.  BMI is Body mass index is 37.56 kg/m., she has not been working on diet and exercise. She admits hasn't been making the best dietary choices. She has been pushing water intake.  Wt Readings from Last 3 Encounters:  12/26/21 261 lb 12.8 oz (118.8 kg)  08/09/21 263 lb (119.3 kg)  10/06/20 265 lb (120.2 kg)    Her blood pressure has been controlled at home, today their BP is BP: 108/74  BP Readings from Last 3 Encounters:  12/26/21 108/74  08/09/21 138/88  10/06/20 120/70    She does workout, She denies chest pain, shortness of breath, dizziness.   She is not on cholesterol medication, she states with crestor she had hair falling out, resistant to start cholesterol med at this point, but more motivated to work on lifestyle change. Cholesterol became  much worse after menopause. Denies significant CVD family hx. Discussed screening risk with  CT coronary calcium, would consider in the future. Her cholesterol is not at goal. The cholesterol last visit was:  Lab Results  Component Value Date   CHOL 263 (H) 08/09/2021   HDL 53 08/09/2021   LDLCALC 158 (H) 08/09/2021   TRIG 336 (H) 08/09/2021   CHOLHDL 5.0 (H) 08/09/2021    She has been working on diet and exercise for diabetes she did not tolerate farxiga due to nasopharyngitis symptoms - she is on jardiance 25mg  and tolerating.  Metformin was prescribed 750 mg BID but stopped - even 1 tab a day was causing diarrhea/upset stomach  NOT checking her sugars at home but has a monitor from work.   and denies paresthesia of the feet, polydipsia, polyuria and visual disturbances. She would be receptive to [facility] for weight loss benefits  Last A1C in the office was: Lab Results  Component Value Date   HGBA1C 8.9 (H) 08/09/2021   CKD II , on lisinopril - last GFR:  Lab Results  Component Value Date   GFRNONAA 79 03/17/2021   GFRNONAA 66 10/06/2020   GFRNONAA 64 03/05/2020   Patient is on Vitamin D supplement, 5000 a day but not consistenly  Lab Results  Component Value Date   VD25OH 74 03/17/2021   She is not on thyroid medication, remotely was on for several years following Hashimoto's but normalized.  Lab Results  Component Value Date   TSH 2.42 08/09/2021    Current Medications:  Current Outpatient Medications on File Prior to Visit  Medication Sig   Biotin 10000 MCG TABS Take by mouth daily.   Calcium Carbonate-Vitamin D (CALCIUM-VITAMIN D3 PO) Take 500 mg by mouth daily.   cetirizine (ZYRTEC) 10 MG tablet Take 10 mg by mouth daily.   famotidine (PEPCID) 20 MG tablet Take 1 tablet 2 x /day as needed for Acid Indigestion & Reflux   fluticasone (FLONASE) 50 MCG/ACT nasal spray Place into both nostrils daily.   JARDIANCE 25 MG TABS tablet TAKE 1 TABLET BY MOUTH DAILY FOR  DIABETES   lisinopril (ZESTRIL) 20 MG tablet TAKE 1 TABLET DAILY FOR BLODD PRESSURE & DIABETIC KIDNEY PROTECTION   OVER THE COUNTER MEDICATION Goli- Superfruits   Phenyleph-CPM-DM-APAP (ALKA-SELTZER PLUS COLD & FLU PO) Take by mouth.   XARELTO 20 MG TABS tablet TAKE 1 TABLET DAILY TO PREVENT BLOOD CLOTS - MUST HAVE OFFICE VISIT BEFORE REFILL   acetaminophen (TYLENOL) 500 MG tablet Take 1,000 mg by mouth every 6 (six) hours as needed for mild pain. (Patient not taking: Reported on 12/26/2021)   ASHWAGANDHA PO Take 300 mg by mouth. (Patient not taking: Reported on 12/26/2021)   Cholecalciferol 5000 units capsule Take 5,000 Units by mouth daily. (Patient not taking: Reported on 12/26/2021)   doxycycline (VIBRAMYCIN) 100 MG capsule Take 1 capsule 2 x/day with food for 10 days. (Patient not taking: Reported on 12/26/2021)   metFORMIN (GLUCOPHAGE-XR) 750 MG 24 hr tablet TAKE 1 TABLET 2 X /DAY WITH MEALS FOR DIABETES (Patient not taking: Reported on 08/09/2021)   No  current facility-administered medications on file prior to visit.    Medical History:  Past Medical History:  Diagnosis Date   Clotting disorder ([place])    Fibrocystic breast    Hyperlipidemia    Hypertension    Hypothyroidism    Iron deficiency anemia    Obesity    BMI 41   Prediabetes    Pulmonary embolism ([place]) 2006   from BCP   Vitamin D deficiency    Allergies Allergies  Allergen Reactions   Farxiga [Dapagliflozin] Other (See Comments)    Severe sinus infeciton   Crestor [Rosuvastatin]     Hair Loss    Penicillins Rash    Did it involve swelling of the face/tongue/throat, SOB, or low BP? No Did it involve sudden or severe rash/hives, skin peeling, or any reaction on the inside of your mouth or nose? No Did you need to seek medical attention at a hospital or doctor's office? No When did it last happen?       If all above answers are NO, may proceed with cephalosporin use.    SURGICAL HISTORY She  has a past surgical  history that includes Tubal ligation (Bilateral); Tonsillectomy and adenoidectomy; Ablation (2006); and Thyroid surgery.   FAMILY HISTORY Her family history includes Cancer in her mother; Diabetes in her father; Osteoporosis in her mother.  SOCIAL HISTORY She  reports that she has never smoked. She has never used smokeless tobacco. She reports that she does not drink alcohol and does not use drugs.  Review of Systems  Constitutional:  Negative for chills, fever, malaise/fatigue and weight loss.  HENT:  Positive for congestion and ear pain (right). Negative for hearing loss, nosebleeds, sinus pain, sore throat and tinnitus.   Eyes:  Negative for blurred vision and double vision.  Respiratory:  Negative for cough, shortness of breath and wheezing.   Cardiovascular:  Negative for chest pain, palpitations, orthopnea, claudication and leg swelling.  Gastrointestinal:  Negative for abdominal pain, blood in stool, constipation, diarrhea, heartburn, melena, nausea and vomiting.  Genitourinary:  Positive for frequency.  Musculoskeletal:  Positive for back pain (lumbar) and neck pain. Negative for falls, joint pain and myalgias.  Skin:  Negative for rash.  Neurological:  Negative for dizziness, tingling, tremors, sensory change, loss of consciousness, weakness and headaches.  Endo/Heme/Allergies:  Negative for polydipsia.  Psychiatric/Behavioral: Negative.  Negative for depression, memory loss and suicidal ideas.   All other systems reviewed and are negative.  Physical Exam: Estimated body mass index is 37.56 kg/m as calculated from the following:   Height as of 09/24/20: 5\' 10"  (1.778 m).   Weight as of this encounter: 261 lb 12.8 oz (118.8 kg). BP 108/74    Pulse 88    Temp (!) 96.8 F (36 C)    Wt 261 lb 12.8 oz (118.8 kg)    SpO2 96%    BMI 37.56 kg/m  General Appearance: Well nourished, in no apparent distress.  Eyes: PERRLA, EOMs, conjunctiva no swelling or erythema Sinuses: No  Frontal/maxillary tenderness  ENT/Mouth: Ext aud canals clear, normal light reflex with TMs without erythema, bulging. Good dentition. No erythema, swelling, or exudate on post pharynx. Tonsils not swollen or erythematous. Hearing normal.  Neck: Supple, thyroid normal. No bruits  Respiratory: Respiratory effort normal, BS equal bilaterally without rales, rhonchi, wheezing or stridor.  Cardio: RRR without murmurs, rubs or gallops. Brisk peripheral pulses without edema.  Abdomen: Soft, nontender, obese, no guarding. Lymphatics: Non tender without lymphadenopathy.  Musculoskeletal: Full  ROM all peripheral extremities,5/5 strength, and normal gait. Right trapezius muscle spasm. Negative CVA tenderness Skin: Warm, dry without rashes, lesions, ecchymosis. Neuro: Cranial nerves intact, reflexes equal bilaterally. Normal muscle tone, no cerebellar symptoms. Sensation intact.  Psych: Awake and oriented X 3, normal affect, Insight and Judgment appropriate.   Magda Bernheim, NP 9:52 AM [facility]

## 2021-12-26 ENCOUNTER — Other Ambulatory Visit: Payer: Self-pay

## 2021-12-26 ENCOUNTER — Ambulatory Visit (INDEPENDENT_AMBULATORY_CARE_PROVIDER_SITE_OTHER): Payer: No Typology Code available for payment source | Admitting: Nurse Practitioner

## 2021-12-26 ENCOUNTER — Encounter: Payer: Self-pay | Admitting: Nurse Practitioner

## 2021-12-26 VITALS — BP 108/74 | HR 88 | Temp 96.8°F | Wt 261.8 lb

## 2021-12-26 DIAGNOSIS — E785 Hyperlipidemia, unspecified: Secondary | ICD-10-CM

## 2021-12-26 DIAGNOSIS — N183 Chronic kidney disease, stage 3 unspecified: Secondary | ICD-10-CM

## 2021-12-26 DIAGNOSIS — S46811A Strain of other muscles, fascia and tendons at shoulder and upper arm level, right arm, initial encounter: Secondary | ICD-10-CM

## 2021-12-26 DIAGNOSIS — E1169 Type 2 diabetes mellitus with other specified complication: Secondary | ICD-10-CM | POA: Diagnosis not present

## 2021-12-26 DIAGNOSIS — E1122 Type 2 diabetes mellitus with diabetic chronic kidney disease: Secondary | ICD-10-CM | POA: Diagnosis not present

## 2021-12-26 DIAGNOSIS — E559 Vitamin D deficiency, unspecified: Secondary | ICD-10-CM

## 2021-12-26 DIAGNOSIS — I10 Essential (primary) hypertension: Secondary | ICD-10-CM

## 2021-12-26 DIAGNOSIS — D751 Secondary polycythemia: Secondary | ICD-10-CM

## 2021-12-26 DIAGNOSIS — M545 Low back pain, unspecified: Secondary | ICD-10-CM

## 2021-12-26 DIAGNOSIS — E039 Hypothyroidism, unspecified: Secondary | ICD-10-CM

## 2021-12-26 DIAGNOSIS — Z79899 Other long term (current) drug therapy: Secondary | ICD-10-CM

## 2021-12-26 DIAGNOSIS — R350 Frequency of micturition: Secondary | ICD-10-CM

## 2021-12-26 DIAGNOSIS — Z86711 Personal history of pulmonary embolism: Secondary | ICD-10-CM

## 2021-12-26 MED ORDER — CYCLOBENZAPRINE HCL 5 MG PO TABS
5.0000 mg | ORAL_TABLET | Freq: Three times a day (TID) | ORAL | 0 refills | Status: DC | PRN
Start: 2021-12-26 — End: 2022-10-31

## 2021-12-27 ENCOUNTER — Other Ambulatory Visit: Payer: Self-pay | Admitting: Nurse Practitioner

## 2021-12-27 DIAGNOSIS — E1169 Type 2 diabetes mellitus with other specified complication: Secondary | ICD-10-CM

## 2021-12-27 DIAGNOSIS — E785 Hyperlipidemia, unspecified: Secondary | ICD-10-CM

## 2021-12-27 MED ORDER — EZETIMIBE 10 MG PO TABS: 10.0000 mg | ORAL_TABLET | Freq: Every day | ORAL | 11 refills | Status: DC

## 2021-12-27 MED ORDER — TRULICITY 0.75 MG/0.5ML ~~LOC~~ SOAJ: 0.7500 mg | SUBCUTANEOUS | 3 refills | Status: DC

## 2021-12-28 ENCOUNTER — Telehealth: Payer: Self-pay

## 2021-12-28 ENCOUNTER — Other Ambulatory Visit: Payer: Self-pay

## 2021-12-28 DIAGNOSIS — K2100 Gastro-esophageal reflux disease with esophagitis, without bleeding: Secondary | ICD-10-CM

## 2021-12-28 LAB — URINE CULTURE
MICRO NUMBER:: 12876877
SPECIMEN QUALITY:: ADEQUATE

## 2021-12-28 LAB — ERYTHROPOIETIN: Erythropoietin: 10.6 m[IU]/mL (ref 2.6–18.5)

## 2021-12-28 LAB — COMPLETE METABOLIC PANEL WITH GFR
AG Ratio: 1.4 (calc) (ref 1.0–2.5)
ALT: 35 U/L — ABNORMAL HIGH (ref 6–29)
AST: 25 U/L (ref 10–35)
Albumin: 4.6 g/dL (ref 3.6–5.1)
Alkaline phosphatase (APISO): 53 U/L (ref 37–153)
BUN: 19 mg/dL (ref 7–25)
CO2: 31 mmol/L (ref 20–32)
Calcium: 9.7 mg/dL (ref 8.6–10.4)
Chloride: 100 mmol/L (ref 98–110)
Creat: 0.92 mg/dL (ref 0.50–1.03)
Globulin: 3.3 g/dL (calc) (ref 1.9–3.7)
Glucose, Bld: 221 mg/dL — ABNORMAL HIGH (ref 65–99)
Potassium: 4.3 mmol/L (ref 3.5–5.3)
Sodium: 137 mmol/L (ref 135–146)
Total Bilirubin: 0.5 mg/dL (ref 0.2–1.2)
Total Protein: 7.9 g/dL (ref 6.1–8.1)
eGFR: 73 mL/min/{1.73_m2} (ref >=60)

## 2021-12-28 LAB — CBC WITH DIFFERENTIAL/PLATELET
Absolute Monocytes: 442 cells/uL (ref 200–950)
Basophils Absolute: 41 cells/uL (ref 0–200)
Basophils Relative: 0.6 %
Eosinophils Absolute: 117 cells/uL (ref 15–500)
Eosinophils Relative: 1.7 %
HCT: 50.9 % — ABNORMAL HIGH (ref 35.0–45.0)
Hemoglobin: 17.1 g/dL — ABNORMAL HIGH (ref 11.7–15.5)
Lymphs Abs: 2960 cells/uL (ref 850–3900)
MCH: 30.9 pg (ref 27.0–33.0)
MCHC: 33.6 g/dL (ref 32.0–36.0)
MCV: 91.9 fL (ref 80.0–100.0)
MPV: 9 fL (ref 7.5–12.5)
Monocytes Relative: 6.4 %
Neutro Abs: 3340 cells/uL (ref 1500–7800)
Neutrophils Relative %: 48.4 %
Platelets: 271 10*3/uL (ref 140–400)
RBC: 5.54 10*6/uL — ABNORMAL HIGH (ref 3.80–5.10)
RDW: 12.2 % (ref 11.0–15.0)
Total Lymphocyte: 42.9 %
WBC: 6.9 10*3/uL (ref 3.8–10.8)

## 2021-12-28 LAB — LIPID PANEL
Cholesterol: 285 mg/dL — ABNORMAL HIGH (ref <200)
HDL: 59 mg/dL (ref >=50)
LDL Cholesterol (Calc): 175 mg/dL (calc) — ABNORMAL HIGH
Non-HDL Cholesterol (Calc): 226 mg/dL (calc) — ABNORMAL HIGH (ref <130)
Total CHOL/HDL Ratio: 4.8 (calc) (ref <5.0)
Triglycerides: 286 mg/dL — ABNORMAL HIGH (ref <150)

## 2021-12-28 LAB — URINALYSIS, ROUTINE W REFLEX MICROSCOPIC
Bilirubin Urine: NEGATIVE
Hgb urine dipstick: NEGATIVE
Ketones, ur: NEGATIVE
Leukocytes,Ua: NEGATIVE
Nitrite: NEGATIVE
Protein, ur: NEGATIVE
Specific Gravity, Urine: 1.036 — ABNORMAL HIGH (ref 1.001–1.035)
pH: <=5 (ref 5.0–8.0)

## 2021-12-28 LAB — HEMOGLOBIN A1C
Hgb A1c MFr Bld: 9.4 % of total Hgb — ABNORMAL HIGH (ref <5.7)
Mean Plasma Glucose: 223 mg/dL
eAG (mmol/L): 12.4 mmol/L

## 2021-12-28 LAB — TSH: TSH: 2.54 mIU/L (ref 0.40–4.50)

## 2021-12-28 MED ORDER — FAMOTIDINE 20 MG PO TABS: ORAL_TABLET | ORAL | 3 refills | Status: DC

## 2021-12-28 NOTE — Telephone Encounter (Signed)
Prior Auth for Trulicity approved through 12/26/24

## 2022-03-05 ENCOUNTER — Other Ambulatory Visit: Payer: Self-pay | Admitting: Nurse Practitioner

## 2022-03-14 NOTE — Progress Notes (Signed)
COMPLETE PHYSICAL ? ? ?Assessment and Plan: ? ? ?Essential hypertension ?- continue medications: lisinopril '20mg'$  ?DASH diet, exercise and monitor at home. Call if greater than 130/80.  ?- CBC with Differential/Platelet ?- CMP ? ? ? Hyperlipidemia ?Has been prescribed statins, hair loss, declines ?She has Zetia at home but never started the prescription, if cholesterol remains elevated will start Zetia 10 mg daily ?check lipids, decrease fatty foods, increase activity.  ?- Lipid panel ? ?Type 2 diabetes mellitus with hyperlipidemia ([place]) ?Taking Jardiance, Trulicity 0.'75mg'$  SQ ?-     Hemoglobin A1c ?- declines chol medication ?Patient not motivated to be healthier ?Discussed general issues about diabetes pathophysiology and management. ?Education: Reviewed ?ABCs? of diabetes management (respective goals in parentheses):  A1C (<7), blood pressure (<130/80), and cholesterol (LDL <70) ?Dietary recommendations ?Encouraged aerobic exercise.  ?Discussed foot care, check daily ?Yearly retinal exam ?Dental exam every 6 months ?Monitor blood glucose, discussed goal for patient ? ? ?CKD stage 3 due to type 2 diabetes mellitus ([place]) ?-     Hemoglobin A1c ?- continue medications ?Increase water ? ?Type 2 diabetes mellitus with stage 3 chronic kidney disease, without long-term current use of insulin, unspecified whether stage 3a or 3b CKD ([place]) ?Increase fluids  ?Avoid NSAIDS ?Blood pressure control ?Monitor sugars  ?Will continue to monitor ? ?Iron deficiency anemia, unspecified iron deficiency anemia type ?-     Iron, Total/Total Iron Binding Cap ?-     Vitamin B12 ? ?History of pulmonary embolus (PE) ?Continue xarelto, increase walking, weight loss advised ? ?Hypothyroidism, unspecified hypothyroidism type ?Off medication, will monitor labs.  ?- TSH ? ?Morbid obesity, unspecified obesity type ([place]) ?Obesity with co morbidities- long discussion about weight loss, diet, and exercise ? ? Vitamin D deficiency ?Continue  supplementation to maintain goal of 70-100 ?Taking Vitamin D 5,000 IU daily ?- Vit D  25 hydroxy (rtn osteoporosis monitoring) ? ? ?Medication Management ?Magnesium ? ?Screening for hematuria and proteinuria ?- Routine UA with reflex microscopic ?- Microalbumin/creatinine urine ratio ? ?Screening for ischemic heart disease ?- EKG ? ? ?Further disposition pending results if labs check today. Discussed med's effects and SE's.   ?Over 30 minutes of face to face interview, exam, counseling, chart review, and critical decision making was performed.  ? ?Discussed med's effects and SE's. Screening labs and tests as requested with regular follow-up as recommended. ?Future Appointments  ?Date Time Provider [place]  ?03/21/2023  9:00 AM Reyanne Hussar, Townsend Roger, NP [facility] None  ? ?______________________________________________________________ ? ?HPI  ?56 y.o. female  presents for complete physical and follow up on HTN, and HLD, T2DM, Vitamin D Defciency and weight, history of PE on xarelto '20mg'$  daily. ? 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    She reports no changes to her diet or activity.  She has increased her water intake. ? ?She admits she needs to exercise but does not and reports she knows she is not making healthy food choices. Her blood pressure has been controlled at home, today their BP is   BP: 118/74 ?BP Readings from Last 3 Encounters:  ?03/20/22 118/74  ?12/26/21 108/74  ?08/09/21 138/88  ? ? ? ?She does not workout, she admits that with working at home and the weather she is not working out. She denies chest pain, shortness of breath, dizziness.  ?BMI is 38.02 , she has not been working on diet and exercise.  Sits all  day for work.  ?Wt Readings from Last 3 Encounters:  ?03/20/22 266 lb 6.4 oz (120.8 kg)  ?12/26/21 261 lb 12.8 oz (118.8 kg)  ?08/09/21 263 lb (119.3 kg)  ? ?She is VERY resistant to medications but long discussion about diabetes, HTN  and HLD uncontrolled as increasing risk of heart attack, or stroke, consequences of blindness, kidney failure, impaired healing and loss of limbs to name a few, if these numbers are not improved.  ? ?She is not on cholesterol medication ,she states with crestor she had hair falling out. Her cholesterol is not at goal. She has Zetia but never started the medication. The cholesterol last visit was:  ?Lab Results  ?Component Value Date  ? CHOL 285 (H) 12/26/2021  ? HDL 59 12/26/2021  ? LDLCALC 175 (H) 12/26/2021  ? TRIG 286 (H) 12/26/2021  ? CHOLHDL 4.8 12/26/2021  ? ? She has not been working on diet and exercise for diabetes ?she is not on ASA, taking xarelto ?she is on ACE/lisinopril ?she can not tolerate farxiga due to nasopharyngitis symptoms - she is on jardiance '25mg'$  and tolerating.   Taking Trulicity 0.'75mg'$  daily ?Was taking  '750mg'$  metformin a day gives her diarrhea.   ?NOT checking her sugars at home but has a monitor from work.  ? and denies paresthesia of the feet, polydipsia, polyuria and visual disturbances. ?Has a meter at home, not checking.  ?Discussed importance of diabetic eye exam, past due. ? Last A1C in the office was: ?Lab Results  ?Component Value Date  ? HGBA1C 9.4 (H) 12/26/2021  ? ?Lab Results  ?Component Value Date  ? GFRNONAA 79 03/17/2021  ? ?Patient is on Vitamin D supplement, 5000 a day but not consistenly  ?Lab Results  ?Component Value Date  ? VD25OH 33 03/17/2021  ? ?She is not on thyroid medication. Her medication was not changed last visit.   ?Lab Results  ?Component Value Date  ? TSH 2.54 12/26/2021  ? ?Current Medications:  ?Current Outpatient Medications on File Prior to Visit  ?Medication Sig  ? Biotin 10000 MCG TABS Take by mouth daily.  ? Calcium Carbonate-Vitamin D (CALCIUM-VITAMIN D3 PO) Take 500 mg by mouth daily.  ? cetirizine (ZYRTEC) 10 MG tablet Take 10 mg by mouth daily.  ? cyclobenzaprine (FLEXERIL) 5 MG tablet Take 1 tablet (5 mg total) by mouth 3 (three) times daily  as needed for muscle spasms.  ? Dulaglutide (TRULICITY) 1.27 NT/7.0YF SOPN Inject 0.75 mg into the skin once a week.  ? famotidine (PEPCID) 20 MG tablet Take 1 tablet 2 x /day as needed for Acid Indigestion & Reflux  ? fluticasone (FLONASE) 50 MCG/ACT nasal spray Place into both nostrils daily.  ? JARDIANCE 25 MG TABS tablet TAKE 1 TABLET BY MOUTH DAILY FOR DIABETES  ? lisinopril (ZESTRIL) 20 MG tablet TAKE 1 TABLET DAILY FOR BLODD PRESSURE & DIABETIC KIDNEY PROTECTION  ? OVER THE COUNTER MEDICATION [place]- Superfruits  ? OVER THE COUNTER MEDICATION [place] apple cider vinegar  ? Phenyleph-CPM-DM-APAP (ALKA-SELTZER PLUS COLD & FLU PO) Take by mouth.  ? XARELTO 20 MG TABS tablet TAKE 1 TABLET DAILY TO PREVENT BLOOD CLOTS - MUST HAVE OFFICE VISIT BEFORE REFILL  ? ?No current facility-administered medications on file prior to visit.  ? ? ?Health Maintenance:   ?Immunization History  ?Administered Date(s) Administered  ? Influenza,inj,Quad PF,6+ Mos 09/06/2019  ? PFIZER(Purple Top)SARS-COV-2 Vaccination 06/16/2020  ? Pneumococcal-Unspecified 12/12/2003  ? Tdap 06/10/2012  ? ?Tetanus: 2013 ?Pneumovax: 2005 ?Prevnar  13: NA ?Flu vaccine: declines ?Zostavax: NA ? ?LMP: No LMP recorded. Patient is postmenopausal.  ?Pap: Dr. Philis Pique scheduled for 04/27/22 ?MGM: Scheduled for 04/27/22  ?DEXA: N/A ?Colonoscopy: 09/24/2020 ?EGD:  N/A ?Echo 06/2019 ?CTA 06/2019 ?[place] AB 2017 ?Last Dental Exam: Dental works, DUE ?Last Eye Exam: 5 years ago but states no issues at this time, DUE ? ?Patient Care Team: ?Unk Pinto, MD as PCP - General (Internal Medicine) ? ?Medical History:  ?Past Medical History:  ?Diagnosis Date  ? Clotting disorder ([place])   ? Fibrocystic breast   ? Hyperlipidemia   ? Hypertension   ? Hypothyroidism   ? Iron deficiency anemia   ? Obesity   ? BMI 41  ? Prediabetes   ? Pulmonary embolism ([place]) 2006  ? from BCP  ? Vitamin D deficiency   ? ?Allergies ?Allergies  ?Allergen Reactions  ? Wilder Glade [Dapagliflozin] Other (See  Comments)  ?  Severe sinus infeciton  ? Crestor [Rosuvastatin]   ?  Hair Loss ?  ? Penicillins Rash  ?  Did it involve swelling of the face/tongue/throat, SOB, or low BP? No ?Did it involve sudden or severe rash/hive

## 2022-03-20 ENCOUNTER — Ambulatory Visit (INDEPENDENT_AMBULATORY_CARE_PROVIDER_SITE_OTHER): Payer: No Typology Code available for payment source | Admitting: Nurse Practitioner

## 2022-03-20 ENCOUNTER — Encounter: Payer: Self-pay | Admitting: Nurse Practitioner

## 2022-03-20 VITALS — BP 118/74 | HR 89 | Temp 97.2°F | Ht 68.5 in | Wt 266.4 lb

## 2022-03-20 DIAGNOSIS — E039 Hypothyroidism, unspecified: Secondary | ICD-10-CM

## 2022-03-20 DIAGNOSIS — Z79899 Other long term (current) drug therapy: Secondary | ICD-10-CM

## 2022-03-20 DIAGNOSIS — Z1389 Encounter for screening for other disorder: Secondary | ICD-10-CM

## 2022-03-20 DIAGNOSIS — Z0001 Encounter for general adult medical examination with abnormal findings: Secondary | ICD-10-CM

## 2022-03-20 DIAGNOSIS — I10 Essential (primary) hypertension: Secondary | ICD-10-CM | POA: Diagnosis not present

## 2022-03-20 DIAGNOSIS — D509 Iron deficiency anemia, unspecified: Secondary | ICD-10-CM

## 2022-03-20 DIAGNOSIS — Z0000 Encounter for general adult medical examination without abnormal findings: Secondary | ICD-10-CM

## 2022-03-20 DIAGNOSIS — N183 Chronic kidney disease, stage 3 unspecified: Secondary | ICD-10-CM

## 2022-03-20 DIAGNOSIS — Z136 Encounter for screening for cardiovascular disorders: Secondary | ICD-10-CM

## 2022-03-20 DIAGNOSIS — E559 Vitamin D deficiency, unspecified: Secondary | ICD-10-CM

## 2022-03-20 DIAGNOSIS — E1122 Type 2 diabetes mellitus with diabetic chronic kidney disease: Secondary | ICD-10-CM

## 2022-03-20 DIAGNOSIS — E1169 Type 2 diabetes mellitus with other specified complication: Secondary | ICD-10-CM

## 2022-03-20 DIAGNOSIS — Z86711 Personal history of pulmonary embolism: Secondary | ICD-10-CM

## 2022-03-21 LAB — CBC WITH DIFFERENTIAL/PLATELET
Absolute Monocytes: 414 cells/uL (ref 200–950)
Basophils Absolute: 41 cells/uL (ref 0–200)
Basophils Relative: 0.6 %
Eosinophils Absolute: 104 cells/uL (ref 15–500)
Eosinophils Relative: 1.5 %
HCT: 47.9 % — ABNORMAL HIGH (ref 35.0–45.0)
Hemoglobin: 16.3 g/dL — ABNORMAL HIGH (ref 11.7–15.5)
Lymphs Abs: 2670 cells/uL (ref 850–3900)
MCH: 31.1 pg (ref 27.0–33.0)
MCHC: 34 g/dL (ref 32.0–36.0)
MCV: 91.4 fL (ref 80.0–100.0)
MPV: 8.9 fL (ref 7.5–12.5)
Monocytes Relative: 6 %
Neutro Abs: 3671 cells/uL (ref 1500–7800)
Neutrophils Relative %: 53.2 %
Platelets: 268 10*3/uL (ref 140–400)
RBC: 5.24 10*6/uL — ABNORMAL HIGH (ref 3.80–5.10)
RDW: 12.1 % (ref 11.0–15.0)
Total Lymphocyte: 38.7 %
WBC: 6.9 10*3/uL (ref 3.8–10.8)

## 2022-03-21 LAB — URINALYSIS, ROUTINE W REFLEX MICROSCOPIC
Bilirubin Urine: NEGATIVE
Hgb urine dipstick: NEGATIVE
Ketones, ur: NEGATIVE
Leukocytes,Ua: NEGATIVE
Nitrite: NEGATIVE
Protein, ur: NEGATIVE
Specific Gravity, Urine: 1.033 (ref 1.001–1.035)
pH: <=5 (ref 5.0–8.0)

## 2022-03-21 LAB — LIPID PANEL
Cholesterol: 240 mg/dL — ABNORMAL HIGH (ref <200)
HDL: 51 mg/dL (ref >=50)
LDL Cholesterol (Calc): 153 mg/dL (calc) — ABNORMAL HIGH
Non-HDL Cholesterol (Calc): 189 mg/dL (calc) — ABNORMAL HIGH (ref <130)
Total CHOL/HDL Ratio: 4.7 (calc) (ref <5.0)
Triglycerides: 225 mg/dL — ABNORMAL HIGH (ref <150)

## 2022-03-21 LAB — HEMOGLOBIN A1C
Hgb A1c MFr Bld: 8.1 % of total Hgb — ABNORMAL HIGH (ref <5.7)
Mean Plasma Glucose: 186 mg/dL
eAG (mmol/L): 10.3 mmol/L

## 2022-03-21 LAB — COMPLETE METABOLIC PANEL WITH GFR
AG Ratio: 1.3 (calc) (ref 1.0–2.5)
ALT: 28 U/L (ref 6–29)
AST: 22 U/L (ref 10–35)
Albumin: 4.1 g/dL (ref 3.6–5.1)
Alkaline phosphatase (APISO): 47 U/L (ref 37–153)
BUN: 16 mg/dL (ref 7–25)
CO2: 30 mmol/L (ref 20–32)
Calcium: 9.6 mg/dL (ref 8.6–10.4)
Chloride: 105 mmol/L (ref 98–110)
Creat: 0.83 mg/dL (ref 0.50–1.03)
Globulin: 3.1 g/dL (calc) (ref 1.9–3.7)
Glucose, Bld: 156 mg/dL — ABNORMAL HIGH (ref 65–99)
Potassium: 4.4 mmol/L (ref 3.5–5.3)
Sodium: 141 mmol/L (ref 135–146)
Total Bilirubin: 0.5 mg/dL (ref 0.2–1.2)
Total Protein: 7.2 g/dL (ref 6.1–8.1)
eGFR: 82 mL/min/{1.73_m2} (ref >=60)

## 2022-03-21 LAB — MICROALBUMIN / CREATININE URINE RATIO
Creatinine, Urine: 48 mg/dL (ref 20–275)
Microalb Creat Ratio: 4 mcg/mg creat (ref <30)
Microalb, Ur: 0.2 mg/dL

## 2022-03-21 LAB — TSH: TSH: 1.68 mIU/L (ref 0.40–4.50)

## 2022-03-21 LAB — MAGNESIUM: Magnesium: 1.9 mg/dL (ref 1.5–2.5)

## 2022-03-21 LAB — VITAMIN D 25 HYDROXY (VIT D DEFICIENCY, FRACTURES): Vit D, 25-Hydroxy: 29 ng/mL — ABNORMAL LOW (ref 30–100)

## 2022-04-05 ENCOUNTER — Other Ambulatory Visit: Payer: Self-pay | Admitting: Adult Health

## 2022-04-18 ENCOUNTER — Other Ambulatory Visit: Payer: Self-pay | Admitting: Nurse Practitioner

## 2022-04-18 DIAGNOSIS — E1169 Type 2 diabetes mellitus with other specified complication: Secondary | ICD-10-CM

## 2022-04-27 LAB — HM MAMMOGRAPHY

## 2022-06-20 ENCOUNTER — Ambulatory Visit: Payer: No Typology Code available for payment source | Admitting: Nurse Practitioner

## 2022-07-08 NOTE — Progress Notes (Unsigned)
FOLLOW UP    Assessment and Plan:   Essential hypertension - continue medications, DASH diet, exercise and monitor at home. Call if greater than 130/80.  - CBC with Differential/Platelet - CMP/GFR  Hyperlipidemia associated with T2DM ([place]) - declines chol medication, hx of hair loss with statin - discussed risks of cardiovascular disease, particularly with T2DM Patient very motivated to be healthier, working on lifestyle Would consider CT coronary calcium scan in the future, declines at this visit -reviewed low saturated fat, high fiber diet  - may add on GLP for weight loss   CKD stage 3 due to type 2 diabetes mellitus (HCC) -     Hemoglobin A1c -     CMP/GFR - continue medications Increase water  Type 2 diabetes mellitus with stage 3 chronic kidney disease, without long-term current use of insulin (HCC) -     Hemoglobin A1c Discussed general issues about diabetes pathophysiology and management., Educational material distributed., Suggested low cholesterol diet., Encouraged aerobic exercise Continue jardiance, intolerant of metformin even low doses May benefit from GLP1ra, plan Ozempic or Trulicity pending P8E if 8+   History of pulmonary embolus (PE) Continue xarelto, working on increasing exercise, weight loss advised  Hypothyroidism, unspecified hypothyroidism type Hx of Hashimoto's, improved, off medication, will monitor labs.  - TSH   Morbid obesity, unspecified obesity type ([place]) Long discussion about weight loss, diet, and exercise Recommended diet heavy in fruits and veggies and low in animal meats, cheeses, and dairy products, appropriate calorie intake Consider GLP-1 RA Follow up at next visit   Vitamin D deficiency - Continue supplement   Polycythemia Check CBC Check erythropoietin   Discussed med's effects and SE's. Screening labs and tests as requested with regular follow-up as recommended. Future Appointments  Date Time Provider [place]   07/10/2022  9:45 AM Alycia Rossetti, NP [facility] None  03/21/2023  9:00 AM Alycia Rossetti, NP [facility] None    HPI  56 y.o. female  presents for a 3 month follow up on diet controlled hypertension, and HLD, DMII, history of PE on xarelto '20mg'$  daily.  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 Trying to improve this, manager is supportive.   She has been experiencing sharp pain  in her right ear for 2 weeks.  Has been using Alka seltzer col and flu with minimal change in pain. She has been having some referred pain in her right jaw and down right side of neck  She does have persistent low back pain with spasms of muscles- had used Flexeril in the past with relief of symptoms  She has had 2 UTI in the past 2 months.  Currently does notice urinary frequency.  Denies hematuria, dysuria.  BMI is There is no height or weight on file to calculate BMI., she has not been working on diet and exercise. She admits hasn't been making the best dietary choices. She has been pushing water intake.  Wt Readings from Last 3 Encounters:  03/20/22 266 lb 6.4 oz (120.8 kg)  12/26/21 261 lb 12.8 oz (118.8 kg)  08/09/21 263 lb (119.3 kg)    Her blood pressure has been controlled at home, today their BP is    BP Readings from Last 3 Encounters:  03/20/22 118/74  12/26/21 108/74  08/09/21 138/88    She does workout, She denies chest pain, shortness of breath, dizziness.  She is not on cholesterol medication, she states with crestor she had hair falling out, resistant to start cholesterol med at this point, but more motivated to work on lifestyle change. Cholesterol became much worse after menopause. Denies significant CVD family hx. Discussed screening risk with  CT coronary calcium, would consider in the future. Her cholesterol is not at goal. The cholesterol last visit was:  Lab Results  Component Value Date    CHOL 240 (H) 03/20/2022   HDL 51 03/20/2022   LDLCALC 153 (H) 03/20/2022   TRIG 225 (H) 03/20/2022   CHOLHDL 4.7 03/20/2022    She has been working on diet and exercise for diabetes she did not tolerate farxiga due to nasopharyngitis symptoms - she is on jardiance '25mg'$  and tolerating.  Metformin was prescribed 750 mg BID but stopped - even 1 tab a day was causing diarrhea/upset stomach  NOT checking her sugars at home but has a monitor from work.   and denies paresthesia of the feet, polydipsia, polyuria and visual disturbances. She would be receptive to [facility] for weight loss benefits  Last A1C in the office was: Lab Results  Component Value Date   HGBA1C 8.1 (H) 03/20/2022   CKD II , on lisinopril - last GFR:  Lab Results  Component Value Date   GFRNONAA 79 03/17/2021   GFRNONAA 66 10/06/2020   GFRNONAA 64 03/05/2020   Patient is on Vitamin D supplement, 5000 a day but not consistenly  Lab Results  Component Value Date   VD25OH 29 (L) 03/20/2022   She is not on thyroid medication, remotely was on for several years following Hashimoto's but normalized.  Lab Results  Component Value Date   TSH 1.68 03/20/2022    Current Medications:  Current Outpatient Medications on File Prior to Visit  Medication Sig   Biotin 10000 MCG TABS Take by mouth daily.   Calcium Carbonate-Vitamin D (CALCIUM-VITAMIN D3 PO) Take 500 mg by mouth daily.   cetirizine (ZYRTEC) 10 MG tablet Take 10 mg by mouth daily.   cyclobenzaprine (FLEXERIL) 5 MG tablet Take 1 tablet (5 mg total) by mouth 3 (three) times daily as needed for muscle spasms.   famotidine (PEPCID) 20 MG tablet Take 1 tablet 2 x /day as needed for Acid Indigestion & Reflux   fluticasone (FLONASE) 50 MCG/ACT nasal spray Place into both nostrils daily.   JARDIANCE 25 MG TABS tablet TAKE 1 TABLET BY MOUTH DAILY FOR DIABETES   lisinopril (ZESTRIL) 20 MG tablet TAKE 1 TABLET DAILY FOR BLODD PRESSURE & DIABETIC KIDNEY PROTECTION   OVER THE  COUNTER MEDICATION [place]- Superfruits   OVER THE COUNTER MEDICATION [place] apple cider vinegar   Phenyleph-CPM-DM-APAP (ALKA-SELTZER PLUS COLD & FLU PO) Take by mouth.   TRULICITY 5.17 OH/6.0VP SOPN INJECT 0.75 MG INTO THE SKIN ONCE A WEEK.   XARELTO 20 MG TABS tablet TAKE 1 TABLET DAILY TO PREVENT BLOOD CLOTS - MUST HAVE OFFICE VISIT BEFORE REFILL   No current facility-administered medications on file prior to visit.    Medical History:  Past Medical History:  Diagnosis Date   Clotting disorder ([place])    Fibrocystic breast    Hyperlipidemia    Hypertension    Hypothyroidism    Iron deficiency anemia    Obesity    BMI 41   Prediabetes    Pulmonary embolism ([place]) 2006   from BCP   Vitamin D deficiency    Allergies Allergies  Allergen Reactions   Farxiga [Dapagliflozin] Other (See  Comments)    Severe sinus infeciton   Crestor [Rosuvastatin]     Hair Loss    Penicillins Rash    Did it involve swelling of the face/tongue/throat, SOB, or low BP? No Did it involve sudden or severe rash/hives, skin peeling, or any reaction on the inside of your mouth or nose? No Did you need to seek medical attention at a hospital or doctor's office? No When did it last happen?       If all above answers are "NO", may proceed with cephalosporin use.    SURGICAL HISTORY She  has a past surgical history that includes Tubal ligation (Bilateral); Tonsillectomy and adenoidectomy; Ablation (2006); and Thyroid surgery.   FAMILY HISTORY Her family history includes Cancer in her mother; Diabetes in her father; Osteoporosis in her mother.  SOCIAL HISTORY She  reports that she has never smoked. She has never used smokeless tobacco. She reports that she does not drink alcohol and does not use drugs.  Review of Systems  Constitutional:  Negative for chills, fever, malaise/fatigue and weight loss.  HENT:  Positive for congestion and ear pain (right). Negative for hearing loss, nosebleeds, sinus pain, sore  throat and tinnitus.   Eyes:  Negative for blurred vision and double vision.  Respiratory:  Negative for cough, shortness of breath and wheezing.   Cardiovascular:  Negative for chest pain, palpitations, orthopnea, claudication and leg swelling.  Gastrointestinal:  Negative for abdominal pain, blood in stool, constipation, diarrhea, heartburn, melena, nausea and vomiting.  Genitourinary:  Positive for frequency.  Musculoskeletal:  Positive for back pain (lumbar) and neck pain. Negative for falls, joint pain and myalgias.  Skin:  Negative for rash.  Neurological:  Negative for dizziness, tingling, tremors, sensory change, loss of consciousness, weakness and headaches.  Endo/Heme/Allergies:  Negative for polydipsia.  Psychiatric/Behavioral: Negative.  Negative for depression, memory loss and suicidal ideas.   All other systems reviewed and are negative.   Physical Exam: Estimated body mass index is 39.92 kg/m as calculated from the following:   Height as of 03/20/22: 5' 8.5" (1.74 m).   Weight as of 03/20/22: 266 lb 6.4 oz (120.8 kg). There were no vitals taken for this visit. General Appearance: Well nourished, in no apparent distress.  Eyes: PERRLA, EOMs, conjunctiva no swelling or erythema Sinuses: No Frontal/maxillary tenderness  ENT/Mouth: Ext aud canals clear, normal light reflex with TMs without erythema, bulging. Good dentition. No erythema, swelling, or exudate on post pharynx. Tonsils not swollen or erythematous. Hearing normal.  Neck: Supple, thyroid normal. No bruits  Respiratory: Respiratory effort normal, BS equal bilaterally without rales, rhonchi, wheezing or stridor.  Cardio: RRR without murmurs, rubs or gallops. Brisk peripheral pulses without edema.  Abdomen: Soft, nontender, obese, no guarding. Lymphatics: Non tender without lymphadenopathy.  Musculoskeletal: Full ROM all peripheral extremities,5/5 strength, and normal gait. Right trapezius muscle spasm. Negative CVA  tenderness Skin: Warm, dry without rashes, lesions, ecchymosis. Neuro: Cranial nerves intact, reflexes equal bilaterally. Normal muscle tone, no cerebellar symptoms. Sensation intact.  Psych: Awake and oriented X 3, normal affect, Insight and Judgment appropriate.   Alycia Rossetti, NP 12:01 PM [facility]

## 2022-07-10 ENCOUNTER — Encounter: Payer: Self-pay | Admitting: Nurse Practitioner

## 2022-07-10 ENCOUNTER — Ambulatory Visit (INDEPENDENT_AMBULATORY_CARE_PROVIDER_SITE_OTHER): Payer: No Typology Code available for payment source | Admitting: Nurse Practitioner

## 2022-07-10 VITALS — BP 104/70 | HR 88 | Temp 97.2°F | Ht 68.5 in | Wt 267.8 lb

## 2022-07-10 DIAGNOSIS — E1122 Type 2 diabetes mellitus with diabetic chronic kidney disease: Secondary | ICD-10-CM

## 2022-07-10 DIAGNOSIS — J302 Other seasonal allergic rhinitis: Secondary | ICD-10-CM | POA: Diagnosis not present

## 2022-07-10 DIAGNOSIS — N183 Chronic kidney disease, stage 3 unspecified: Secondary | ICD-10-CM

## 2022-07-10 DIAGNOSIS — E1169 Type 2 diabetes mellitus with other specified complication: Secondary | ICD-10-CM

## 2022-07-10 DIAGNOSIS — I10 Essential (primary) hypertension: Secondary | ICD-10-CM

## 2022-07-10 DIAGNOSIS — D751 Secondary polycythemia: Secondary | ICD-10-CM

## 2022-07-10 DIAGNOSIS — Z86711 Personal history of pulmonary embolism: Secondary | ICD-10-CM

## 2022-07-10 DIAGNOSIS — Z79899 Other long term (current) drug therapy: Secondary | ICD-10-CM

## 2022-07-10 DIAGNOSIS — E039 Hypothyroidism, unspecified: Secondary | ICD-10-CM

## 2022-07-10 DIAGNOSIS — E785 Hyperlipidemia, unspecified: Secondary | ICD-10-CM

## 2022-07-10 MED ORDER — DEXAMETHASONE SODIUM PHOSPHATE 10 MG/ML IJ SOLN
10.0000 mg | Freq: Once | INTRAMUSCULAR | Status: AC
  Administered 2022-07-10: 10 mg via INTRAMUSCULAR

## 2022-07-10 MED ORDER — MONTELUKAST SODIUM 10 MG PO TABS: 10.0000 mg | ORAL_TABLET | Freq: Every day | ORAL | 2 refills | Status: DC

## 2022-07-11 ENCOUNTER — Other Ambulatory Visit: Payer: Self-pay | Admitting: Nurse Practitioner

## 2022-07-11 DIAGNOSIS — N183 Chronic kidney disease, stage 3 unspecified: Secondary | ICD-10-CM

## 2022-07-11 DIAGNOSIS — E1169 Type 2 diabetes mellitus with other specified complication: Secondary | ICD-10-CM

## 2022-07-11 LAB — COMPLETE METABOLIC PANEL WITH GFR
AG Ratio: 1.2 (calc) (ref 1.0–2.5)
ALT: 37 U/L — ABNORMAL HIGH (ref 6–29)
AST: 30 U/L (ref 10–35)
Albumin: 3.9 g/dL (ref 3.6–5.1)
Alkaline phosphatase (APISO): 42 U/L (ref 37–153)
BUN: 19 mg/dL (ref 7–25)
CO2: 25 mmol/L (ref 20–32)
Calcium: 9.2 mg/dL (ref 8.6–10.4)
Chloride: 102 mmol/L (ref 98–110)
Creat: 0.95 mg/dL (ref 0.50–1.03)
Globulin: 3.2 g/dL (calc) (ref 1.9–3.7)
Glucose, Bld: 164 mg/dL — ABNORMAL HIGH (ref 65–99)
Potassium: 4.2 mmol/L (ref 3.5–5.3)
Sodium: 136 mmol/L (ref 135–146)
Total Bilirubin: 0.6 mg/dL (ref 0.2–1.2)
Total Protein: 7.1 g/dL (ref 6.1–8.1)
eGFR: 69 mL/min/{1.73_m2} (ref >=60)

## 2022-07-11 LAB — CBC WITH DIFFERENTIAL/PLATELET
Absolute Monocytes: 428 cells/uL (ref 200–950)
Basophils Absolute: 38 cells/uL (ref 0–200)
Basophils Relative: 0.5 %
Eosinophils Absolute: 113 cells/uL (ref 15–500)
Eosinophils Relative: 1.5 %
HCT: 47.5 % — ABNORMAL HIGH (ref 35.0–45.0)
Hemoglobin: 16.5 g/dL — ABNORMAL HIGH (ref 11.7–15.5)
Lymphs Abs: 3188 cells/uL (ref 850–3900)
MCH: 31.5 pg (ref 27.0–33.0)
MCHC: 34.7 g/dL (ref 32.0–36.0)
MCV: 90.6 fL (ref 80.0–100.0)
MPV: 8.8 fL (ref 7.5–12.5)
Monocytes Relative: 5.7 %
Neutro Abs: 3735 cells/uL (ref 1500–7800)
Neutrophils Relative %: 49.8 %
Platelets: 267 10*3/uL (ref 140–400)
RBC: 5.24 10*6/uL — ABNORMAL HIGH (ref 3.80–5.10)
RDW: 12.7 % (ref 11.0–15.0)
Total Lymphocyte: 42.5 %
WBC: 7.5 10*3/uL (ref 3.8–10.8)

## 2022-07-11 LAB — LIPID PANEL
Cholesterol: 241 mg/dL — ABNORMAL HIGH (ref <200)
HDL: 48 mg/dL — ABNORMAL LOW (ref >=50)
LDL Cholesterol (Calc): 154 mg/dL (calc) — ABNORMAL HIGH
Non-HDL Cholesterol (Calc): 193 mg/dL (calc) — ABNORMAL HIGH (ref <130)
Total CHOL/HDL Ratio: 5 (calc) — ABNORMAL HIGH (ref <5.0)
Triglycerides: 219 mg/dL — ABNORMAL HIGH (ref <150)

## 2022-07-11 LAB — HEMOGLOBIN A1C
Hgb A1c MFr Bld: 7.9 % of total Hgb — ABNORMAL HIGH (ref <5.7)
Mean Plasma Glucose: 180 mg/dL
eAG (mmol/L): 10 mmol/L

## 2022-07-11 LAB — TSH: TSH: 1.81 mIU/L (ref 0.40–4.50)

## 2022-07-11 MED ORDER — TRULICITY 1.5 MG/0.5ML ~~LOC~~ SOAJ: 1.5000 mg | SUBCUTANEOUS | 3 refills | Status: DC

## 2022-07-11 MED ORDER — ROSUVASTATIN CALCIUM 5 MG PO TABS: 5.0000 mg | ORAL_TABLET | Freq: Every day | ORAL | 11 refills | Status: DC

## 2022-09-01 ENCOUNTER — Other Ambulatory Visit: Payer: Self-pay | Admitting: Nurse Practitioner

## 2022-09-02 ENCOUNTER — Other Ambulatory Visit: Payer: Self-pay | Admitting: Nurse Practitioner

## 2022-09-02 DIAGNOSIS — E1169 Type 2 diabetes mellitus with other specified complication: Secondary | ICD-10-CM

## 2022-09-10 ENCOUNTER — Other Ambulatory Visit: Payer: Self-pay | Admitting: Nurse Practitioner

## 2022-09-10 DIAGNOSIS — J302 Other seasonal allergic rhinitis: Secondary | ICD-10-CM

## 2022-10-07 ENCOUNTER — Other Ambulatory Visit: Payer: Self-pay | Admitting: Nurse Practitioner

## 2022-10-10 NOTE — Progress Notes (Deleted)
FOLLOW UP    Assessment and Plan:   Essential hypertension - continue medications, DASH diet, exercise and monitor at home. Call if greater than 130/80.  - CBC with Differential/Platelet - CMP/GFR  Hyperlipidemia associated with T2DM ([place]) - Had hair loss with rosuvastatin - discussed risks of cardiovascular disease, particularly with T2DM She is willing to restart medication if LDL remains elevated -reviewed low saturated fat, high fiber diet  - may add on GLP for weight loss   CKD stage 3 due to type 2 diabetes mellitus (HCC) -     Hemoglobin A1c -     CMP/GFR - continue medications Increase water  Type 2 diabetes mellitus with stage 3 chronic kidney disease, without long-term current use of insulin (HCC) -     Hemoglobin A1c Discussed general issues about diabetes pathophysiology and management., Educational material distributed., Suggested low cholesterol diet., Encouraged aerobic exercise Continue jardiance, intolerant of metformin even low doses Continue Trulicity 0.16PV , if V7S remains above 8 will increase dose to 1.5  History of pulmonary embolus (PE) Continue xarelto, working on increasing exercise, weight loss advised  Hypothyroidism, unspecified hypothyroidism type Hx of Hashimoto's, improved, off medication, will monitor labs.  - TSH   Morbid obesity, unspecified obesity type ([place]) Long discussion about weight loss, diet, and exercise Recommended diet heavy in fruits and veggies and low in animal meats, cheeses, and dairy products, appropriate calorie intake Continue Trulicity Follow up at next visit   Vitamin D deficiency - Continue supplement   Polycythemia Check CBC  Seasonal allergic rhinitis Continue flonase daily Given Dexamethasone 10 mg IM today in the office Start Singulair 10 mg nightly   Discussed med's effects and SE's. Screening labs and tests as requested with regular follow-up as recommended. Future Appointments  Date Time  Provider [place]  10/11/2022 10:30 AM Alycia Rossetti, NP [facility] None  03/21/2023  9:00 AM Alycia Rossetti, NP [facility] None    HPI  56 y.o. female  presents for a 3 month follow up on diet controlled hypertension, and HLD, DMII, history of PE on xarelto 63m daily.  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 Trying to improve this, manager is supportive.   She has noted head congestion and nonproductive cough with ear pressure x 1 week. She has continued problems with allergies.  She is taking Flonase and Zyrtec daily but symptoms are worsening/  She has increased puffiness around her eyes from allergies.    BMI is There is no height or weight on file to calculate BMI., she has not been working on diet and exercise. She admits hasn't been making the best dietary choices. She has been pushing water intake.  Wt Readings from Last 3 Encounters:  07/10/22 267 lb 12.8 oz (121.5 kg)  03/20/22 266 lb 6.4 oz (120.8 kg)  12/26/21 261 lb 12.8 oz (118.8 kg)    Her blood pressure has been controlled at home, today their BP is    BP Readings from Last 3 Encounters:  07/10/22 104/70  03/20/22 118/74  12/26/21 108/74    She does workout, She denies chest pain, shortness of breath, dizziness.   She is not on cholesterol medication, she states with crestor she had hair falling out, resistant to start cholesterol med at this point. She states she has not been doing any dietary changes for her cholesterol.  Denies significant CVD  family hx. Discussed screening risk with  CT coronary calcium, would consider in the future. Her cholesterol is not at goal. The cholesterol last visit was:  Lab Results  Component Value Date   CHOL 241 (H) 07/10/2022   HDL 48 (L) 07/10/2022   LDLCALC 154 (H) 07/10/2022   TRIG 219 (H) 07/10/2022   CHOLHDL 5.0 (H) 07/10/2022    She has been working on diet and exercise  for diabetes she did not tolerate farxiga due to nasopharyngitis symptoms - she is on jardiance 52WU and Trulicity 1.32GM weekly and is tolerating.  NOT checking her sugars at home but has a monitor from work.   and denies paresthesia of the feet, polydipsia, polyuria and visual disturbances. She would be receptive to [facility] for weight loss benefits  Last A1C in the office was: Lab Results  Component Value Date   HGBA1C 7.9 (H) 07/10/2022   CKD II , on lisinopril - last GFR:  Lab Results  Component Value Date   EGFR 69 07/10/2022    Patient is on Vitamin D supplement, 5000 a day but not consistenly  Lab Results  Component Value Date   VD25OH 29 (L) 03/20/2022   She is not on thyroid medication, remotely was on for several years following Hashimoto's but normalized.  Lab Results  Component Value Date   TSH 1.81 07/10/2022    Current Medications:  Current Outpatient Medications on File Prior to Visit  Medication Sig   Biotin 10000 MCG TABS Take by mouth daily.   Calcium Carbonate-Vitamin D (CALCIUM-VITAMIN D3 PO) Take 500 mg by mouth daily.   cetirizine (ZYRTEC) 10 MG tablet Take 10 mg by mouth daily.   cyclobenzaprine (FLEXERIL) 5 MG tablet Take 1 tablet (5 mg total) by mouth 3 (three) times daily as needed for muscle spasms.   Dulaglutide (TRULICITY) 1.5 WN/0.2VO SOPN Inject 1.5 mg into the skin once a week.   famotidine (PEPCID) 20 MG tablet Take 1 tablet 2 x /day as needed for Acid Indigestion & Reflux   fluticasone (FLONASE) 50 MCG/ACT nasal spray Place into both nostrils daily.   JARDIANCE 25 MG TABS tablet TAKE 1 TABLET BY MOUTH DAILY FOR DIABETES   lisinopril (ZESTRIL) 20 MG tablet TAKE 1 TABLET DAILY FOR BLODD PRESSURE & DIABETIC KIDNEY PROTECTION   montelukast (SINGULAIR) 10 MG tablet TAKE 1 TABLET BY MOUTH EVERY DAY   Phenyleph-CPM-DM-APAP (ALKA-SELTZER PLUS COLD & FLU PO) Take by mouth.   rosuvastatin (CRESTOR) 5 MG tablet TAKE 1 TABLET (5 MG TOTAL) BY MOUTH DAILY.    XARELTO 20 MG TABS tablet TAKE 1 TABLET DAILY TO PREVENT BLOOD CLOTS - MUST HAVE OFFICE VISIT BEFORE REFILL   No current facility-administered medications on file prior to visit.    Medical History:  Past Medical History:  Diagnosis Date   Clotting disorder ([place])    Fibrocystic breast    Hyperlipidemia    Hypertension    Hypothyroidism    Iron deficiency anemia    Obesity    BMI 41   Prediabetes    Pulmonary embolism ([place]) 2006   from BCP   Vitamin D deficiency    Allergies Allergies  Allergen Reactions   Farxiga [Dapagliflozin] Other (See Comments)    Severe sinus infeciton   Crestor [Rosuvastatin]     Hair Loss    Penicillins Rash    Did it involve swelling of the face/tongue/throat, SOB, or low BP? No Did it involve sudden or severe rash/hives, skin peeling, or any  reaction on the inside of your mouth or nose? No Did you need to seek medical attention at a hospital or doctor's office? No When did it last happen?       If all above answers are "NO", may proceed with cephalosporin use.    SURGICAL HISTORY She  has a past surgical history that includes Tubal ligation (Bilateral); Tonsillectomy and adenoidectomy; Ablation (2006); and Thyroid surgery.   FAMILY HISTORY Her family history includes Cancer in her mother; Diabetes in her father; Osteoporosis in her mother.  SOCIAL HISTORY She  reports that she has never smoked. She has never used smokeless tobacco. She reports that she does not drink alcohol and does not use drugs.  Review of Systems  Constitutional:  Negative for chills, fever, malaise/fatigue and weight loss.  HENT:  Positive for congestion and ear pain (right). Negative for hearing loss, nosebleeds, sinus pain, sore throat and tinnitus.   Eyes:  Negative for blurred vision and double vision.  Respiratory:  Negative for cough, shortness of breath and wheezing.   Cardiovascular:  Negative for chest pain, palpitations, orthopnea, claudication and leg  swelling.  Gastrointestinal:  Negative for abdominal pain, blood in stool, constipation, diarrhea, heartburn, melena, nausea and vomiting.  Genitourinary:  Negative for frequency.  Musculoskeletal:  Negative for back pain, falls, joint pain, myalgias and neck pain.  Skin:  Negative for rash.  Neurological:  Negative for dizziness, tingling, tremors, sensory change, loss of consciousness, weakness and headaches.  Endo/Heme/Allergies:  Negative for polydipsia.  Psychiatric/Behavioral: Negative.  Negative for depression, memory loss and suicidal ideas.   All other systems reviewed and are negative.   Physical Exam: Estimated body mass index is 40.13 kg/m as calculated from the following:   Height as of 07/10/22: 5' 8.5" (1.74 m).   Weight as of 07/10/22: 267 lb 12.8 oz (121.5 kg). There were no vitals taken for this visit. General Appearance: Well nourished, in no apparent distress.  Eyes: PERRLA, EOMs, conjunctiva no swelling or erythema Sinuses: No Frontal/maxillary tenderness  ENT/Mouth: Ext aud canals clear, normal light reflex with TMs without erythema, bulging. Good dentition. No erythema, swelling, or exudate on post pharynx. Tonsils not swollen or erythematous. Hearing normal. Pale nasal mucosa  Neck: Supple, thyroid normal. No bruits  Respiratory: Respiratory effort normal, BS equal bilaterally without rales, rhonchi, wheezing or stridor.  Cardio: RRR without murmurs, rubs or gallops. Brisk peripheral pulses without edema.  Abdomen: Soft, nontender, obese, no guarding. Lymphatics: Non tender without lymphadenopathy.  Musculoskeletal: Full ROM all peripheral extremities,5/5 strength, and normal gait. Skin: Warm, dry without rashes, lesions, ecchymosis. Neuro: Cranial nerves intact, reflexes equal bilaterally. Normal muscle tone, no cerebellar symptoms. Sensation intact.  Psych: Awake and oriented X 3, normal affect, Insight and Judgment appropriate.   Alycia Rossetti, NP 12:30  PM [facility]

## 2022-10-11 ENCOUNTER — Ambulatory Visit: Payer: No Typology Code available for payment source | Admitting: Nurse Practitioner

## 2022-10-30 NOTE — Progress Notes (Unsigned)
FOLLOW UP    Assessment and Plan:   Essential hypertension - continue medications, DASH diet, exercise and monitor at home. Call if greater than 130/80.  - CBC with Differential/Platelet - CMP/GFR  Hyperlipidemia associated with T2DM ([place]) - Had hair loss with rosuvastatin - discussed risks of cardiovascular disease, particularly with T2DM She is willing to restart medication if LDL remains elevated -reviewed low saturated fat, high fiber diet  - may add on GLP for weight loss  - lipid panel  CKD stage 3 due to type 2 diabetes mellitus (HCC) -     Hemoglobin A1c -     CMP/GFR - continue medications Increase water  Type 2 diabetes mellitus with stage 3 chronic kidney disease, without long-term current use of insulin (HCC) -     Hemoglobin A1c Discussed general issues about diabetes pathophysiology and management., Educational material distributed., Suggested low cholesterol diet., Encouraged aerobic exercise Continue jardiance, intolerant of metformin even low doses Continue Trulicity 0.1EO , if F1Q remains above 8 will increase dose to 3  History of pulmonary embolus (PE) Continue xarelto, working on increasing exercise, weight loss advised  Hypothyroidism, unspecified hypothyroidism type Hx of Hashimoto's, improved, off medication, will monitor labs.  - TSH   Morbid obesity, unspecified obesity type ([place]) Long discussion about weight loss, diet, and exercise Recommended diet heavy in fruits and veggies and low in animal meats, cheeses, and dairy products, appropriate calorie intake Continue Trulicity Follow up at next visit   Vitamin D deficiency - Continue supplement   Polycythemia Check CBC  Seasonal allergic rhinitis Continue flonase and Zyrtec daily Continue Singulair 10 mg nightly  Spasm of thoracic back muscle Will treat heat and muscle relaxant Offered trigger point injection but patient declined  Discussed med's effects and SE's. Screening labs and  tests as requested with regular follow-up as recommended. Future Appointments  Date Time Provider [place]  03/21/2023  9:00 AM Alycia Rossetti, NP [facility] None    HPI  56 y.o. female  presents for a 3 month follow up on diet controlled hypertension, and HLD, DMII, history of PE on xarelto 25m daily.  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 Trying to improve this, manager is supportive.  She has been experiencing a lot of left thoracic back pain.  Allergies are well controlled with singulair , Zyrtec and Flonase.   BMI is Body mass index is 39.83 kg/m., she has not been working on diet and exercise. She admits hasn't been making the best dietary choices. She has been pushing water intake.  Wt Readings from Last 3 Encounters:  10/31/22 265 lb 12.8 oz (120.6 kg)  07/10/22 267 lb 12.8 oz (121.5 kg)  03/20/22 266 lb 6.4 oz (120.8 kg)    Her blood pressure has been controlled at home, today their BP is BP: 108/74  BP Readings from Last 3 Encounters:  10/31/22 108/74  07/10/22 104/70  03/20/22 118/74    She does workout, She denies chest pain, shortness of breath, dizziness.   She is not on cholesterol medication, she states with crestor she had hair falling out, resistant to start cholesterol med at this point. She states she has not been doing any dietary changes for her cholesterol.  Denies significant CVD family hx. Discussed screening risk with  CT coronary calcium, would consider in the future. Her cholesterol is not at goal She has  not been working on diet. The cholesterol last visit was:  Lab Results  Component Value Date   CHOL 241 (H) 07/10/2022   HDL 48 (L) 07/10/2022   LDLCALC 154 (H) 07/10/2022   TRIG 219 (H) 07/10/2022   CHOLHDL 5.0 (H) 07/10/2022    She has been working on diet and exercise for diabetes she did not tolerate farxiga due to nasopharyngitis symptoms -  she is on jardiance 33AS and Trulicity 5.0NL weekly and is tolerating.  NOT checking her sugars at home but has a monitor from work.   and denies paresthesia of the feet, polydipsia, polyuria and visual disturbances.  Last A1C in the office was: Lab Results  Component Value Date   HGBA1C 7.9 (H) 07/10/2022   CKD II , on lisinopril - last GFR:  Lab Results  Component Value Date   EGFR 69 07/10/2022    Patient is on Vitamin D supplement, 5000 a day but not consistenly  Lab Results  Component Value Date   VD25OH 29 (L) 03/20/2022   She is not on thyroid medication, remotely was on for several years following Hashimoto's but normalized.  Lab Results  Component Value Date   TSH 1.81 07/10/2022    Current Medications:  Current Outpatient Medications on File Prior to Visit  Medication Sig   Calcium Carbonate-Vitamin D (CALCIUM-VITAMIN D3 PO) Take 500 mg by mouth daily.   cetirizine (ZYRTEC) 10 MG tablet Take 10 mg by mouth daily.   cyclobenzaprine (FLEXERIL) 5 MG tablet Take 1 tablet (5 mg total) by mouth 3 (three) times daily as needed for muscle spasms.   Dulaglutide (TRULICITY) 1.5 ZJ/6.7HA SOPN Inject 1.5 mg into the skin once a week.   famotidine (PEPCID) 20 MG tablet Take 1 tablet 2 x /day as needed for Acid Indigestion & Reflux   fluticasone (FLONASE) 50 MCG/ACT nasal spray Place into both nostrils daily.   JARDIANCE 25 MG TABS tablet TAKE 1 TABLET BY MOUTH DAILY FOR DIABETES   lisinopril (ZESTRIL) 20 MG tablet TAKE 1 TABLET DAILY FOR BLODD PRESSURE & DIABETIC KIDNEY PROTECTION   montelukast (SINGULAIR) 10 MG tablet TAKE 1 TABLET BY MOUTH EVERY DAY   naproxen sodium (ALEVE) 220 MG tablet Take 220 mg by mouth.   XARELTO 20 MG TABS tablet TAKE 1 TABLET DAILY TO PREVENT BLOOD CLOTS - MUST HAVE OFFICE VISIT BEFORE REFILL   Biotin 10000 MCG TABS Take by mouth daily. (Patient not taking: Reported on 10/31/2022)   Phenyleph-CPM-DM-APAP (ALKA-SELTZER PLUS COLD & FLU PO) Take by mouth.  (Patient not taking: Reported on 10/31/2022)   rosuvastatin (CRESTOR) 5 MG tablet TAKE 1 TABLET (5 MG TOTAL) BY MOUTH DAILY. (Patient not taking: Reported on 10/31/2022)   No current facility-administered medications on file prior to visit.    Medical History:  Past Medical History:  Diagnosis Date   Clotting disorder ([place])    Fibrocystic breast    Hyperlipidemia    Hypertension    Hypothyroidism    Iron deficiency anemia    Obesity    BMI 41   Prediabetes    Pulmonary embolism ([place]) 2006   from BCP   Vitamin D deficiency    Allergies Allergies  Allergen Reactions   Farxiga [Dapagliflozin] Other (See Comments)    Severe sinus infeciton   Crestor [Rosuvastatin]     Hair Loss    Penicillins Rash    Did it involve swelling of the face/tongue/throat, SOB, or low BP? No Did it involve sudden or severe  rash/hives, skin peeling, or any reaction on the inside of your mouth or nose? No Did you need to seek medical attention at a hospital or doctor's office? No When did it last happen?       If all above answers are "NO", may proceed with cephalosporin use.    SURGICAL HISTORY She  has a past surgical history that includes Tubal ligation (Bilateral); Tonsillectomy and adenoidectomy; Ablation (2006); and Thyroid surgery.   FAMILY HISTORY Her family history includes Cancer in her mother; Diabetes in her father; Osteoporosis in her mother.  SOCIAL HISTORY She  reports that she has never smoked. She has never used smokeless tobacco. She reports that she does not drink alcohol and does not use drugs.  Review of Systems  Constitutional:  Negative for chills, fever, malaise/fatigue and weight loss.  HENT:  Negative for congestion, ear pain, hearing loss, nosebleeds, sinus pain, sore throat and tinnitus.   Eyes:  Negative for blurred vision and double vision.  Respiratory:  Negative for cough, shortness of breath and wheezing.   Cardiovascular:  Negative for chest pain, palpitations,  orthopnea, claudication and leg swelling.  Gastrointestinal:  Negative for abdominal pain, blood in stool, constipation, diarrhea, heartburn, melena, nausea and vomiting.  Genitourinary:  Negative for frequency.  Musculoskeletal:  Positive for back pain (left thoracic). Negative for falls, joint pain, myalgias and neck pain.  Skin:  Negative for rash.  Neurological:  Negative for dizziness, tingling, tremors, sensory change, loss of consciousness, weakness and headaches.  Endo/Heme/Allergies:  Negative for polydipsia.  Psychiatric/Behavioral: Negative.  Negative for depression, memory loss and suicidal ideas.   All other systems reviewed and are negative.   Physical Exam: Estimated body mass index is 39.83 kg/m as calculated from the following:   Height as of this encounter: 5' 8.5" (1.74 m).   Weight as of this encounter: 265 lb 12.8 oz (120.6 kg). BP 108/74   Pulse 86   Temp (!) 97.5 F (36.4 C)   Ht 5' 8.5" (1.74 m)   Wt 265 lb 12.8 oz (120.6 kg)   SpO2 94%   BMI 39.83 kg/m  General Appearance: Well nourished, in no apparent distress.  Eyes: PERRLA, EOMs, conjunctiva no swelling or erythema Sinuses: No Frontal/maxillary tenderness  ENT/Mouth: Ext aud canals clear, normal light reflex with TMs without erythema, bulging. Good dentition. No erythema, swelling, or exudate on post pharynx. Tonsils not swollen or erythematous. Hearing normal. Pale nasal mucosa  Neck: Supple, thyroid normal. No bruits  Respiratory: Respiratory effort normal, BS equal bilaterally without rales, rhonchi, wheezing or stridor.  Cardio: RRR without murmurs, rubs or gallops. Brisk peripheral pulses without edema.  Abdomen: Soft, nontender, obese, no guarding. Lymphatics: Non tender without lymphadenopathy.  Musculoskeletal: Full ROM all peripheral extremities,5/5 strength, and normal gait. Muscle spasm left thoracic area Skin: Warm, dry without rashes, lesions, ecchymosis. Neuro: Cranial nerves intact,  reflexes equal bilaterally. Normal muscle tone, no cerebellar symptoms. Sensation intact.  Psych: Awake and oriented X 3, normal affect, Insight and Judgment appropriate.   Alycia Rossetti, NP 2:46 PM [facility]

## 2022-10-31 ENCOUNTER — Encounter: Payer: Self-pay | Admitting: Nurse Practitioner

## 2022-10-31 ENCOUNTER — Ambulatory Visit (INDEPENDENT_AMBULATORY_CARE_PROVIDER_SITE_OTHER): Payer: No Typology Code available for payment source | Admitting: Nurse Practitioner

## 2022-10-31 VITALS — BP 108/74 | HR 86 | Temp 97.5°F | Ht 68.5 in | Wt 265.8 lb

## 2022-10-31 DIAGNOSIS — E1122 Type 2 diabetes mellitus with diabetic chronic kidney disease: Secondary | ICD-10-CM | POA: Diagnosis not present

## 2022-10-31 DIAGNOSIS — I10 Essential (primary) hypertension: Secondary | ICD-10-CM | POA: Diagnosis not present

## 2022-10-31 DIAGNOSIS — E1169 Type 2 diabetes mellitus with other specified complication: Secondary | ICD-10-CM

## 2022-10-31 DIAGNOSIS — N183 Chronic kidney disease, stage 3 unspecified: Secondary | ICD-10-CM

## 2022-10-31 DIAGNOSIS — Z79899 Other long term (current) drug therapy: Secondary | ICD-10-CM

## 2022-10-31 DIAGNOSIS — M62830 Muscle spasm of back: Secondary | ICD-10-CM

## 2022-10-31 DIAGNOSIS — D751 Secondary polycythemia: Secondary | ICD-10-CM

## 2022-10-31 DIAGNOSIS — M546 Pain in thoracic spine: Secondary | ICD-10-CM

## 2022-10-31 DIAGNOSIS — E559 Vitamin D deficiency, unspecified: Secondary | ICD-10-CM

## 2022-10-31 DIAGNOSIS — Z86711 Personal history of pulmonary embolism: Secondary | ICD-10-CM

## 2022-10-31 DIAGNOSIS — R7989 Other specified abnormal findings of blood chemistry: Secondary | ICD-10-CM

## 2022-10-31 DIAGNOSIS — E785 Hyperlipidemia, unspecified: Secondary | ICD-10-CM

## 2022-10-31 DIAGNOSIS — J302 Other seasonal allergic rhinitis: Secondary | ICD-10-CM

## 2022-10-31 DIAGNOSIS — E039 Hypothyroidism, unspecified: Secondary | ICD-10-CM

## 2022-10-31 MED ORDER — CYCLOBENZAPRINE HCL 10 MG PO TABS: 10.0000 mg | ORAL_TABLET | Freq: Three times a day (TID) | ORAL | 0 refills | Status: DC | PRN

## 2022-10-31 NOTE — Patient Instructions (Signed)
Muscle Cramps and Spasms Muscle cramps and spasms are when muscles tighten by themselves. They usually get better within minutes. Muscle cramps are painful. They are usually stronger and last longer than muscle spasms. Muscle spasms may or may not be painful. They can last a few seconds or much longer. Cramps and spasms can affect any muscle, but they occur most often in the calf muscles of the leg. They are usually not caused by a serious problem. In many cases, the cause is not known. Some common causes include: Doing more physical work or exercise than your body is ready for. Using the muscles too much (overuse) by repeating certain movements too many times. Staying in a certain position for a long time. Playing a sport or doing an activity without preparing properly. Using bad form or technique while playing a sport or doing an activity. Not having enough water in your body (dehydration). Injury. Side effects of some medicines. Low levels of the salts and minerals in your blood (electrolytes), such as low potassium or calcium. Follow these instructions at home: Managing pain and stiffness     Massage, stretch, and relax the muscle. Do this for many minutes at a time. If told, put heat on tight or tense muscles as often as told by your doctor. Use the heat source that your doctor recommends, such as a moist heat pack or a heating pad. Place a towel between your skin and the heat source. Leave the heat on for 20-30 minutes. Remove the heat if your skin turns bright red. This is very important if you are not able to feel pain, heat, or cold. You may have a greater risk of getting burned. If told, put ice on the affected area. This may help if you are sore or have pain after a cramp or spasm. Put ice in a plastic bag. Place a towel between your skin and the bag. Leave the ice on for 20 minutes, 2-3 times a day. Try taking hot showers or baths to help relax tight muscles. Eating and  drinking Drink enough fluid to keep your pee (urine) pale yellow. Eat a healthy diet to help ensure that your muscles work well. This should include: Fruits and vegetables. Lean protein. Whole grains. Low-fat or nonfat dairy products. General instructions If you are having cramps often, avoid intense exercise for several days. Take over-the-counter and prescription medicines only as told by your doctor. Watch for any changes in your symptoms. Keep all follow-up visits as told by your doctor. This is important. Contact a doctor if: Your cramps or spasms get worse or happen more often. Your cramps or spasms do not get better with time. Summary Muscle cramps and spasms are when muscles tighten by themselves. They usually get better within minutes. Cramps and spasms occur most often in the calf muscles of the leg. Massage, stretch, and relax the muscle. This may help the cramp or spasm go away. Drink enough fluid to keep your pee (urine) pale yellow. This information is not intended to replace advice given to you by your health care provider. Make sure you discuss any questions you have with your health care provider. Document Revised: 06/17/2021 Document Reviewed: 06/17/2021 Elsevier Patient Education  [place].

## 2022-11-01 LAB — COMPLETE METABOLIC PANEL WITH GFR
AG Ratio: 1.5 (calc) (ref 1.0–2.5)
ALT: 28 U/L (ref 6–29)
AST: 21 U/L (ref 10–35)
Albumin: 4.3 g/dL (ref 3.6–5.1)
Alkaline phosphatase (APISO): 41 U/L (ref 37–153)
BUN: 17 mg/dL (ref 7–25)
CO2: 29 mmol/L (ref 20–32)
Calcium: 9.4 mg/dL (ref 8.6–10.4)
Chloride: 105 mmol/L (ref 98–110)
Creat: 0.81 mg/dL (ref 0.50–1.03)
Globulin: 2.9 g/dL (calc) (ref 1.9–3.7)
Glucose, Bld: 107 mg/dL — ABNORMAL HIGH (ref 65–99)
Potassium: 4 mmol/L (ref 3.5–5.3)
Sodium: 141 mmol/L (ref 135–146)
Total Bilirubin: 0.4 mg/dL (ref 0.2–1.2)
Total Protein: 7.2 g/dL (ref 6.1–8.1)
eGFR: 84 mL/min/{1.73_m2} (ref >=60)

## 2022-11-01 LAB — CBC WITH DIFFERENTIAL/PLATELET
Absolute Monocytes: 422 cells/uL (ref 200–950)
Basophils Absolute: 32 cells/uL (ref 0–200)
Basophils Relative: 0.5 %
Eosinophils Absolute: 170 cells/uL (ref 15–500)
Eosinophils Relative: 2.7 %
HCT: 46.4 % — ABNORMAL HIGH (ref 35.0–45.0)
Hemoglobin: 16.2 g/dL — ABNORMAL HIGH (ref 11.7–15.5)
Lymphs Abs: 3056 cells/uL (ref 850–3900)
MCH: 31.2 pg (ref 27.0–33.0)
MCHC: 34.9 g/dL (ref 32.0–36.0)
MCV: 89.2 fL (ref 80.0–100.0)
MPV: 9 fL (ref 7.5–12.5)
Monocytes Relative: 6.7 %
Neutro Abs: 2621 cells/uL (ref 1500–7800)
Neutrophils Relative %: 41.6 %
Platelets: 263 10*3/uL (ref 140–400)
RBC: 5.2 10*6/uL — ABNORMAL HIGH (ref 3.80–5.10)
RDW: 12.3 % (ref 11.0–15.0)
Total Lymphocyte: 48.5 %
WBC: 6.3 10*3/uL (ref 3.8–10.8)

## 2022-11-01 LAB — LIPID PANEL
Cholesterol: 218 mg/dL — ABNORMAL HIGH (ref <200)
HDL: 52 mg/dL (ref >=50)
LDL Cholesterol (Calc): 134 mg/dL (calc) — ABNORMAL HIGH
Non-HDL Cholesterol (Calc): 166 mg/dL (calc) — ABNORMAL HIGH (ref <130)
Total CHOL/HDL Ratio: 4.2 (calc) (ref <5.0)
Triglycerides: 181 mg/dL — ABNORMAL HIGH (ref <150)

## 2022-11-01 LAB — HEMOGLOBIN A1C
Hgb A1c MFr Bld: 7.7 % of total Hgb — ABNORMAL HIGH (ref <5.7)
Mean Plasma Glucose: 174 mg/dL
eAG (mmol/L): 9.7 mmol/L

## 2022-11-01 LAB — TSH: TSH: 2.14 mIU/L (ref 0.40–4.50)

## 2022-11-02 ENCOUNTER — Other Ambulatory Visit: Payer: Self-pay | Admitting: Nurse Practitioner

## 2022-11-02 DIAGNOSIS — E1169 Type 2 diabetes mellitus with other specified complication: Secondary | ICD-10-CM

## 2022-11-02 DIAGNOSIS — N183 Chronic kidney disease, stage 3 unspecified: Secondary | ICD-10-CM

## 2023-01-01 ENCOUNTER — Other Ambulatory Visit: Payer: Self-pay | Admitting: Nurse Practitioner

## 2023-01-01 DIAGNOSIS — K2100 Gastro-esophageal reflux disease with esophagitis, without bleeding: Secondary | ICD-10-CM

## 2023-02-06 NOTE — Progress Notes (Deleted)
FOLLOW UP    Assessment and Plan:   Essential hypertension - continue medications, DASH diet, exercise and monitor at home. Call if greater than 130/80.  - CBC with Differential/Platelet - CMP/GFR  Hyperlipidemia associated with T2DM ([place]) - declines chol medication, hx of hair loss with statin - discussed risks of cardiovascular disease, particularly with T2DM Patient very motivated to be healthier, working on lifestyle Would consider CT coronary calcium scan in the future, declines at this visit -reviewed low saturated fat, high fiber diet  - may add on GLP for weight loss   CKD stage 3 due to type 2 diabetes mellitus (HCC) -     Hemoglobin A1c -     CMP/GFR - continue medications Increase water  Type 2 diabetes mellitus with stage 3 chronic kidney disease, without long-term current use of insulin (HCC) -     Hemoglobin A1c Discussed general issues about diabetes pathophysiology and management., Educational material distributed., Suggested low cholesterol diet., Encouraged aerobic exercise Continue jardiance, intolerant of metformin even low doses May benefit from GLP1ra, plan Ozempic or Trulicity pending [id number] if 8+   History of pulmonary embolus (PE) Continue xarelto, working on increasing exercise, weight loss advised  Hypothyroidism, unspecified hypothyroidism type Hx of Hashimoto's, improved, off medication, will monitor labs.  - TSH   Morbid obesity, unspecified obesity type ([place]) Long discussion about weight loss, diet, and exercise Recommended diet heavy in fruits and veggies and low in animal meats, cheeses, and dairy products, appropriate calorie intake Consider GLP-1 RA Follow up at next visit   Vitamin D deficiency - Continue supplement   Polycythemia Check CBC Check erythropoietin   Discussed med's effects and SE's. Screening labs and tests as requested with regular follow-up as recommended. Future Appointments  Date Time Provider [place]   02/07/2023  4:00 PM Alycia Rossetti, NP [facility] None  03/21/2023  9:00 AM Alycia Rossetti, NP [facility] None    HPI  57 y.o. female  presents for a 3 month follow up on diet controlled hypertension, and HLD, DMII, history of PE on xarelto '20mg'$  daily.  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 Trying to improve this, manager is supportive.   She has been experiencing sharp pain  in her right ear for 2 weeks.  Has been using Alka seltzer col and flu with minimal change in pain. She has been having some referred pain in her right jaw and down right side of neck  She does have persistent low back pain with spasms of muscles- had used Flexeril in the past with relief of symptoms  She has had 2 UTI in the past 2 months.  Currently does notice urinary frequency.  Denies hematuria, dysuria.  BMI is There is no height or weight on file to calculate BMI., she has not been working on diet and exercise. She admits hasn't been making the best dietary choices. She has been pushing water intake.  Wt Readings from Last 3 Encounters:  10/31/22 265 lb 12.8 oz (120.6 kg)  07/10/22 267 lb 12.8 oz (121.5 kg)  03/20/22 266 lb 6.4 oz (120.8 kg)    Her blood pressure has been controlled at home, today their BP is    BP Readings from Last 3 Encounters:  10/31/22 108/74  07/10/22 104/70  03/20/22 118/74    She does workout, She denies chest pain, shortness of breath, dizziness.  She is not on cholesterol medication, she states with crestor she had hair falling out, resistant to start cholesterol med at this point, but more motivated to work on lifestyle change. Cholesterol became much worse after menopause. Denies significant CVD family hx. Discussed screening risk with  CT coronary calcium, would consider in the future. Her cholesterol is not at goal. The cholesterol last visit was:  Lab Results  Component Value  Date   CHOL 218 (H) 10/31/2022   HDL 52 10/31/2022   LDLCALC 134 (H) 10/31/2022   TRIG 181 (H) 10/31/2022   CHOLHDL 4.2 10/31/2022    She has been working on diet and exercise for diabetes she did not tolerate farxiga due to nasopharyngitis symptoms - she is on jardiance '25mg'$  and tolerating.  Metformin was prescribed 750 mg BID but stopped - even 1 tab a day was causing diarrhea/upset stomach  NOT checking her sugars at home but has a monitor from work.   and denies paresthesia of the feet, polydipsia, polyuria and visual disturbances. She would be receptive to [facility] for weight loss benefits  Last A1C in the office was: Lab Results  Component Value Date   HGBA1C 7.7 (H) 10/31/2022   CKD II , on lisinopril - last GFR:  Lab Results  Component Value Date   GFRNONAA 79 03/17/2021   GFRNONAA 66 10/06/2020   GFRNONAA 64 03/05/2020   Patient is on Vitamin D supplement, 5000 a day but not consistenly  Lab Results  Component Value Date   VD25OH 29 (L) 03/20/2022   She is not on thyroid medication, remotely was on for several years following Hashimoto's but normalized.  Lab Results  Component Value Date   TSH 2.14 10/31/2022    Current Medications:  Current Outpatient Medications on File Prior to Visit  Medication Sig   Biotin 10000 MCG TABS Take by mouth daily. (Patient not taking: Reported on 10/31/2022)   Calcium Carbonate-Vitamin D (CALCIUM-VITAMIN D3 PO) Take 500 mg by mouth daily.   cetirizine (ZYRTEC) 10 MG tablet Take 10 mg by mouth daily.   cyclobenzaprine (FLEXERIL) 10 MG tablet Take 1 tablet (10 mg total) by mouth 3 (three) times daily as needed for muscle spasms.   Dulaglutide (TRULICITY) 1.5 0000000 SOPN INJECT 1.5 MG INTO THE SKIN ONCE A WEEK.   famotidine (PEPCID) 20 MG tablet TAKE 1 TABLET 2 X /DAY AS NEEDED FOR ACID INDIGESTION & REFLUX   fluticasone (FLONASE) 50 MCG/ACT nasal spray Place into both nostrils daily.   JARDIANCE 25 MG TABS tablet TAKE 1 TABLET BY  MOUTH DAILY FOR DIABETES   lisinopril (ZESTRIL) 20 MG tablet TAKE 1 TABLET DAILY FOR BLODD PRESSURE & DIABETIC KIDNEY PROTECTION   montelukast (SINGULAIR) 10 MG tablet TAKE 1 TABLET BY MOUTH EVERY DAY   naproxen sodium (ALEVE) 220 MG tablet Take 220 mg by mouth.   XARELTO 20 MG TABS tablet TAKE 1 TABLET DAILY TO PREVENT BLOOD CLOTS - MUST HAVE OFFICE VISIT BEFORE REFILL   No current facility-administered medications on file prior to visit.    Medical History:  Past Medical History:  Diagnosis Date   Clotting disorder ([place])    Fibrocystic breast    Hyperlipidemia    Hypertension    Hypothyroidism    Iron deficiency anemia    Obesity    BMI 41   Prediabetes    Pulmonary embolism ([place]) 2006   from BCP   Vitamin D deficiency    Allergies Allergies  Allergen Reactions  [place] [Dapagliflozin] Other (See Comments)    Severe sinus infeciton   Crestor [Rosuvastatin]     Hair Loss    Penicillins Rash    Did it involve swelling of the face/tongue/throat, SOB, or low BP? No Did it involve sudden or severe rash/hives, skin peeling, or any reaction on the inside of your mouth or nose? No Did you need to seek medical attention at a hospital or doctor's office? No When did it last happen?       If all above answers are "NO", may proceed with cephalosporin use.    SURGICAL HISTORY She  has a past surgical history that includes Tubal ligation (Bilateral); Tonsillectomy and adenoidectomy; Ablation (2006); and Thyroid surgery.   FAMILY HISTORY Her family history includes Cancer in her mother; Diabetes in her father; Osteoporosis in her mother.  SOCIAL HISTORY She  reports that she has never smoked. She has never used smokeless tobacco. She reports that she does not drink alcohol and does not use drugs.  Review of Systems  Constitutional:  Negative for chills, fever, malaise/fatigue and weight loss.  HENT:  Positive for congestion and ear pain (right). Negative for hearing loss,  nosebleeds, sinus pain, sore throat and tinnitus.   Eyes:  Negative for blurred vision and double vision.  Respiratory:  Negative for cough, shortness of breath and wheezing.   Cardiovascular:  Negative for chest pain, palpitations, orthopnea, claudication and leg swelling.  Gastrointestinal:  Negative for abdominal pain, blood in stool, constipation, diarrhea, heartburn, melena, nausea and vomiting.  Genitourinary:  Positive for frequency.  Musculoskeletal:  Positive for back pain (lumbar) and neck pain. Negative for falls, joint pain and myalgias.  Skin:  Negative for rash.  Neurological:  Negative for dizziness, tingling, tremors, sensory change, loss of consciousness, weakness and headaches.  Endo/Heme/Allergies:  Negative for polydipsia.  Psychiatric/Behavioral: Negative.  Negative for depression, memory loss and suicidal ideas.   All other systems reviewed and are negative.   Physical Exam: Estimated body mass index is 39.83 kg/m as calculated from the following:   Height as of 10/31/22: 5' 8.5" (1.74 m).   Weight as of 10/31/22: 265 lb 12.8 oz (120.6 kg). There were no vitals taken for this visit. General Appearance: Well nourished, in no apparent distress.  Eyes: PERRLA, EOMs, conjunctiva no swelling or erythema Sinuses: No Frontal/maxillary tenderness  ENT/Mouth: Ext aud canals clear, normal light reflex with TMs without erythema, bulging. Good dentition. No erythema, swelling, or exudate on post pharynx. Tonsils not swollen or erythematous. Hearing normal.  Neck: Supple, thyroid normal. No bruits  Respiratory: Respiratory effort normal, BS equal bilaterally without rales, rhonchi, wheezing or stridor.  Cardio: RRR without murmurs, rubs or gallops. Brisk peripheral pulses without edema.  Abdomen: Soft, nontender, obese, no guarding. Lymphatics: Non tender without lymphadenopathy.  Musculoskeletal: Full ROM all peripheral extremities,5/5 strength, and normal gait. Right trapezius  muscle spasm. Negative CVA tenderness Skin: Warm, dry without rashes, lesions, ecchymosis. Neuro: Cranial nerves intact, reflexes equal bilaterally. Normal muscle tone, no cerebellar symptoms. Sensation intact.  Psych: Awake and oriented X 3, normal affect, Insight and Judgment appropriate.   Alycia Rossetti, NP 9:46 AM [facility]

## 2023-02-07 ENCOUNTER — Ambulatory Visit: Payer: No Typology Code available for payment source | Admitting: Nurse Practitioner

## 2023-02-07 DIAGNOSIS — E559 Vitamin D deficiency, unspecified: Secondary | ICD-10-CM

## 2023-02-07 DIAGNOSIS — D751 Secondary polycythemia: Secondary | ICD-10-CM

## 2023-02-07 DIAGNOSIS — I10 Essential (primary) hypertension: Secondary | ICD-10-CM

## 2023-02-07 DIAGNOSIS — E039 Hypothyroidism, unspecified: Secondary | ICD-10-CM

## 2023-02-07 DIAGNOSIS — Z79899 Other long term (current) drug therapy: Secondary | ICD-10-CM

## 2023-02-07 DIAGNOSIS — E1122 Type 2 diabetes mellitus with diabetic chronic kidney disease: Secondary | ICD-10-CM

## 2023-02-07 DIAGNOSIS — N183 Chronic kidney disease, stage 3 unspecified: Secondary | ICD-10-CM

## 2023-02-07 DIAGNOSIS — E1169 Type 2 diabetes mellitus with other specified complication: Secondary | ICD-10-CM

## 2023-02-07 DIAGNOSIS — Z86711 Personal history of pulmonary embolism: Secondary | ICD-10-CM

## 2023-03-06 ENCOUNTER — Other Ambulatory Visit: Payer: Self-pay | Admitting: Nurse Practitioner

## 2023-03-13 ENCOUNTER — Other Ambulatory Visit: Payer: Self-pay | Admitting: Nurse Practitioner

## 2023-03-13 DIAGNOSIS — J302 Other seasonal allergic rhinitis: Secondary | ICD-10-CM

## 2023-03-20 NOTE — Progress Notes (Unsigned)
COMPLETE PHYSICAL   Assessment and Plan:  Encounter for general adult medical examination with abnormal findings Due yearly  Essential hypertension - continue medications: lisinopril 20mg  DASH diet, exercise and monitor at home. Call if greater than 130/80.  - CBC with Differential/Platelet - CMP    Hyperlipidemia Has been prescribed statins, hair loss, declines She has Zetia at home but never started the prescription, if cholesterol remains elevated will start Zetia 10 mg daily check lipids, decrease fatty foods, increase activity.  - Lipid panel  Type 2 diabetes mellitus with hyperlipidemia (HCC) Taking Jardiance, Trulicity 0.75mg  SQ -     Hemoglobin A1c - declines chol medication Patient not motivated to be healthier Discussed general issues about diabetes pathophysiology and management. Education: Reviewed 'ABCs' of diabetes management (respective goals in parentheses):  A1C (<7), blood pressure (<130/80), and cholesterol (LDL <70) Dietary recommendations Encouraged aerobic exercise.  Discussed foot care, check daily Yearly retinal exam Dental exam every 6 months Monitor blood glucose, discussed goal for patient   CKD stage 2 due to type 2 diabetes mellitus (HCC) -     Hemoglobin A1c - continue medications Increase water  Type 2 diabetes mellitus with stage 3 chronic kidney disease, without long-term current use of insulin, unspecified whether stage 2CKD (HCC) Increase fluids  Avoid NSAIDS Blood pressure control Monitor sugars  Will continue to monitor  Iron deficiency anemia, unspecified iron deficiency anemia type -     Iron, Total/Total Iron Binding Cap -     Vitamin B12  History of pulmonary embolus (PE) Continue xarelto, increase walking, weight loss advised  Hypothyroidism, unspecified hypothyroidism type Off medication, will monitor labs.  - TSH  Morbid obesity with Type 2 Diabetes Mellitus(HCC) Long discussion about weight loss, diet, and  exercise Recommended diet heavy in fruits and veggies and low in animal meats, cheeses, and dairy products, appropriate calorie intake Patient will work on decreasing saturated fats and simple carbs.  Increase lean protein and exercise Follow up at next visit  Onychomycosis Begin Terbinafine on 30 days and off 30 days Will return in 4 weeks to check LFT's  Maxillary sinusitis Continue to push fluids and take Alka Seltzer cold and sinus Z-pak as directed Continue Xyzal and Flonase for allergies.   Vitamin D deficiency Continue supplementation to maintain goal of 70-100 Taking Vitamin D 5,000 IU daily - Vit D  25 hydroxy (rtn osteoporosis monitoring)   Medication Management Magnesium  Screening for hematuria and proteinuria - Routine UA with reflex microscopic - Microalbumin/creatinine urine ratio  Screening for ischemic heart disease - EKG  Screening for AAA - U/S ABD Retroperitoneal LTD   Further disposition pending results if labs check today. Discussed med's effects and SE's.   Over 30 minutes of face to face interview, exam, counseling, chart review, and critical decision making was performed.   Discussed med's effects and SE's. Screening labs and tests as requested with regular follow-up as recommended. Future Appointments  Date Time Provider Department Center  03/20/2024  9:00 AM Raynelle Dick, NP [facility] None   ______________________________________________________________  HPI  57 y.o. female  presents for complete physical and follow up on HTN, and HLD, T2DM, Vitamin D Defciency and weight, history of PE on xarelto 20mg  daily.  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   She has been having some trouble with her sinuses- headaches with stabbing pain in her right ear  Also has had a dry cough and nausea. Symptoms have been  present for 6 days.  She has been taking alka seltzer cold and sinus.   She has noticed toenail fungus  on her left big toe. Also some on her right big toenail.    Her blood pressure has been controlled at home, today their BP is   BP: 100/68 BP Readings from Last 3 Encounters:  03/21/23 100/68  10/31/22 108/74  07/10/22 104/70  She does not workout, she admits that with working at home and the weather she is not working out. She denies chest pain, shortness of breath, dizziness.   BMI is 38.02 , she has not been working on diet and exercise.  Sits all day for work. She has started walking.  She has not been making good food choices. She is on Trulicity for her diabetes and is down 8 pounds in the past 9 months Wt Readings from Last 3 Encounters:  03/21/23 259 lb 12.8 oz (117.8 kg)  10/31/22 265 lb 12.8 oz (120.6 kg)  07/10/22 267 lb 12.8 oz (121.5 kg)   She is VERY resistant to medications but long discussion about diabetes, HTN and HLD uncontrolled as increasing risk of heart attack, or stroke, consequences of blindness, kidney failure, impaired healing and loss of limbs to name a few, if these numbers are not improved.   She is not on cholesterol medication ,she states with crestor she had hair falling out. Her cholesterol is not at goal. She has Zetia but never started the medication. The cholesterol last visit was:  Lab Results  Component Value Date   CHOL 218 (H) 10/31/2022   HDL 52 10/31/2022   LDLCALC 134 (H) 10/31/2022   TRIG 181 (H) 10/31/2022   CHOLHDL 4.2 10/31/2022    She has not been working on diet and exercise for diabetes she is not on ASA, taking xarelto she is on ACE/lisinopril she can not tolerate farxiga due to nasopharyngitis symptoms - she is on jardiance 25mg  and tolerating.   Taking Trulicity 1.5 mg  weekly Was taking  750mg  metformin a day gives her diarrhea.   Denies paresthesia of the feet, polydipsia, polyuria and visual disturbances. Has a meter at home, not checking.  Discussed importance of diabetic eye exam, past due.  Last A1C in the office was: Lab  Results  Component Value Date   HGBA1C 7.7 (H) 10/31/2022   Lab Results  Component Value Date   EGFR 84 10/31/2022    Patient is on Vitamin D supplement, 5000 a day but not consistenly  Lab Results  Component Value Date   VD25OH 29 (L) 03/20/2022   She is not on thyroid medication.  Lab Results  Component Value Date   TSH 2.14 10/31/2022   Current Medications:  Current Outpatient Medications on File Prior to Visit  Medication Sig   Calcium Carbonate-Vitamin D (CALCIUM-VITAMIN D3 PO) Take 500 mg by mouth daily.   cetirizine (ZYRTEC) 10 MG tablet Take 10 mg by mouth daily.   cyclobenzaprine (FLEXERIL) 10 MG tablet Take 1 tablet (10 mg total) by mouth 3 (three) times daily as needed for muscle spasms.   famotidine (PEPCID) 20 MG tablet TAKE 1 TABLET 2 X /DAY AS NEEDED FOR ACID INDIGESTION & REFLUX   fluticasone (FLONASE) 50 MCG/ACT nasal spray Place into both nostrils daily.   JARDIANCE 25 MG TABS tablet TAKE 1 TABLET BY MOUTH DAILY FOR DIABETES   lisinopril (ZESTRIL) 20 MG tablet TAKE 1 TABLET DAILY FOR BLODD PRESSURE & DIABETIC KIDNEY PROTECTION   montelukast (SINGULAIR)  10 MG tablet TAKE 1 TABLET BY MOUTH EVERY DAY   naproxen sodium (ALEVE) 220 MG tablet Take 220 mg by mouth.   Phenylephrine-Aspirin (ALKA-SELTZER PLUS SINUS PO) Take by mouth.   Saw Palmetto, Serenoa repens, (SAW PALMETTO PO) Take by mouth.   XARELTO 20 MG TABS tablet TAKE 1 TABLET DAILY TO PREVENT BLOOD CLOTS - MUST HAVE OFFICE VISIT BEFORE REFILL   Biotin 40981 MCG TABS Take by mouth daily. (Patient not taking: Reported on 10/31/2022)   Dulaglutide (TRULICITY) 1.5 MG/0.5ML SOPN INJECT 1.5 MG INTO THE SKIN ONCE A WEEK. (Patient not taking: Reported on 03/21/2023)   No current facility-administered medications on file prior to visit.    Health Maintenance:   Immunization History  Administered Date(s) Administered   Influenza,inj,Quad PF,6+ Mos 09/06/2019   PFIZER(Purple Top)SARS-COV-2 Vaccination 06/16/2020    Pneumococcal-Unspecified 12/12/2003   Tdap 06/10/2012   Health Maintenance  Topic Date Due   OPHTHALMOLOGY EXAM  Never done   Zoster Vaccines- Shingrix (1 of 2) Never done   MAMMOGRAM  Never done   PAP SMEAR-Modifier  06/29/2014   COVID-19 Vaccine (2 - Pfizer risk series) 07/07/2020   FOOT EXAM  03/05/2021   DTaP/Tdap/Td (2 - Td or Tdap) 06/10/2022   Diabetic kidney evaluation - Urine ACR  03/21/2023   HEMOGLOBIN A1C  05/01/2023   INFLUENZA VACCINE  07/12/2023   Diabetic kidney evaluation - eGFR measurement  11/01/2023   COLONOSCOPY (Pts 45-35yrs Insurance coverage will need to be confirmed)  09/24/2030   Hepatitis C Screening  Completed   HIV Screening  Completed   HPV VACCINES  Aged Out     Patient Care Team: Lucky Cowboy, MD as PCP - General (Internal Medicine)  Medical History:  Past Medical History:  Diagnosis Date   Clotting disorder    Fibrocystic breast    Hyperlipidemia    Hypertension    Hypothyroidism    Iron deficiency anemia    Obesity    BMI 41   Prediabetes    Pulmonary embolism 2006   from BCP   Vitamin D deficiency    Allergies Allergies  Allergen Reactions   Farxiga [Dapagliflozin] Other (See Comments)    Severe sinus infeciton   Crestor [Rosuvastatin]     Hair Loss    Penicillins Rash    Did it involve swelling of the face/tongue/throat, SOB, or low BP? No Did it involve sudden or severe rash/hives, skin peeling, or any reaction on the inside of your mouth or nose? No Did you need to seek medical attention at a hospital or doctor's office? No When did it last happen?       If all above answers are "NO", may proceed with cephalosporin use.    SURGICAL HISTORY She  has a past surgical history that includes Tubal ligation (Bilateral); Tonsillectomy and adenoidectomy; Ablation (2006); and Thyroid surgery.   FAMILY HISTORY Her family history includes Cancer in her mother; Diabetes in her father; Osteoporosis in her mother.  SOCIAL  HISTORY She  reports that she has never smoked. She has never used smokeless tobacco. She reports that she does not drink alcohol and does not use drugs.  Review of Systems  Constitutional:  Negative for chills and fever.  HENT:  Positive for congestion, ear pain and sinus pain. Negative for hearing loss, sore throat and tinnitus.   Eyes:  Negative for blurred vision and double vision.  Respiratory:  Negative for cough, hemoptysis, sputum production, shortness of breath and wheezing.  Cardiovascular:  Negative for chest pain, palpitations and leg swelling.  Gastrointestinal:  Negative for abdominal pain, constipation, diarrhea, heartburn, nausea and vomiting.  Genitourinary:  Negative for dysuria and urgency.  Musculoskeletal:  Negative for back pain, falls, joint pain, myalgias and neck pain.  Skin:  Negative for rash.       Right great toenail fungus  Neurological:  Negative for dizziness, tingling, tremors, weakness and headaches.  Endo/Heme/Allergies:  Does not bruise/bleed easily.  Psychiatric/Behavioral:  Negative for depression and suicidal ideas. The patient is not nervous/anxious and does not have insomnia.     Physical Exam: Estimated body mass index is 39.5 kg/m as calculated from the following:   Height as of this encounter: 5\' 8"  (1.727 m).   Weight as of this encounter: 259 lb 12.8 oz (117.8 kg). BP 100/68   Pulse 84   Temp (!) 97.5 F (36.4 C)   Ht 5\' 8"  (1.727 m)   Wt 259 lb 12.8 oz (117.8 kg)   SpO2 96%   BMI 39.50 kg/m    General Appearance: Well nourished, in no apparent distress.  Eyes: PERRLA, EOMs, conjunctiva no swelling or erythema, normal fundi and vessels.  Sinuses: Positive maxillary tenderness ENT/Mouth: Ext aud canals clear, TM's dusky with fluid bilaterally. Good dentition. No erythema, swelling, or exudate on post pharynx. Tonsils not swollen or erythematous. Hearing normal.  Neck: Supple, thyroid normal. No bruits  Respiratory: Respiratory  effort normal, BS equal bilaterally without rales, rhonchi, wheezing or stridor.  Cardio: RRR without murmurs, rubs or gallops. Brisk peripheral pulses without edema.  Chest: symmetric, with normal excursions and percussion.  Breasts: defer Abdomen: Soft, nontender, obese, mild hepatomegaly,  no guarding, rebound, hernias, masses, or organomegaly.  Lymphatics: Non tender without lymphadenopathy.  Genitourinary: defer Musculoskeletal: Full ROM all peripheral extremities,5/5 strength, and normal gait.  Skin: Warm, dry without rashes, lesions, ecchymosis. Neuro: Cranial nerves intact, reflexes equal bilaterally. Normal muscle tone, no cerebellar symptoms. Sensation intact.  Psych: Awake and oriented X 3, normal affect, Insight and Judgment appropriate.   EKG: NSR, no ST changes   Manus Gunningana W. Julliana Whitmyer ANP-C  [facility]  03/21/2023

## 2023-03-21 ENCOUNTER — Encounter: Payer: Self-pay | Admitting: Nurse Practitioner

## 2023-03-21 ENCOUNTER — Ambulatory Visit (INDEPENDENT_AMBULATORY_CARE_PROVIDER_SITE_OTHER): Payer: No Typology Code available for payment source | Admitting: Nurse Practitioner

## 2023-03-21 VITALS — BP 100/68 | HR 84 | Temp 97.5°F | Ht 68.0 in | Wt 259.8 lb

## 2023-03-21 DIAGNOSIS — I10 Essential (primary) hypertension: Secondary | ICD-10-CM | POA: Diagnosis not present

## 2023-03-21 DIAGNOSIS — Z1389 Encounter for screening for other disorder: Secondary | ICD-10-CM

## 2023-03-21 DIAGNOSIS — I700 Atherosclerosis of aorta: Secondary | ICD-10-CM

## 2023-03-21 DIAGNOSIS — Z136 Encounter for screening for cardiovascular disorders: Secondary | ICD-10-CM | POA: Diagnosis not present

## 2023-03-21 DIAGNOSIS — Z0001 Encounter for general adult medical examination with abnormal findings: Secondary | ICD-10-CM

## 2023-03-21 DIAGNOSIS — D509 Iron deficiency anemia, unspecified: Secondary | ICD-10-CM

## 2023-03-21 DIAGNOSIS — J320 Chronic maxillary sinusitis: Secondary | ICD-10-CM

## 2023-03-21 DIAGNOSIS — E039 Hypothyroidism, unspecified: Secondary | ICD-10-CM

## 2023-03-21 DIAGNOSIS — Z0000 Encounter for general adult medical examination without abnormal findings: Secondary | ICD-10-CM

## 2023-03-21 DIAGNOSIS — E785 Hyperlipidemia, unspecified: Secondary | ICD-10-CM

## 2023-03-21 DIAGNOSIS — Z86711 Personal history of pulmonary embolism: Secondary | ICD-10-CM

## 2023-03-21 DIAGNOSIS — E1169 Type 2 diabetes mellitus with other specified complication: Secondary | ICD-10-CM

## 2023-03-21 DIAGNOSIS — B351 Tinea unguium: Secondary | ICD-10-CM

## 2023-03-21 DIAGNOSIS — E1122 Type 2 diabetes mellitus with diabetic chronic kidney disease: Secondary | ICD-10-CM

## 2023-03-21 DIAGNOSIS — Z79899 Other long term (current) drug therapy: Secondary | ICD-10-CM

## 2023-03-21 DIAGNOSIS — E559 Vitamin D deficiency, unspecified: Secondary | ICD-10-CM

## 2023-03-21 LAB — CBC WITH DIFFERENTIAL/PLATELET
Absolute Monocytes: 319 cells/uL (ref 200–950)
HCT: 47.2 % — ABNORMAL HIGH (ref 35.0–45.0)
Lymphs Abs: 3499 cells/uL (ref 850–3900)
MPV: 8.9 fL (ref 7.5–12.5)
Neutro Abs: 1870 cells/uL (ref 1500–7800)
RBC: 5.28 10*6/uL — ABNORMAL HIGH (ref 3.80–5.10)
RDW: 13 % (ref 11.0–15.0)

## 2023-03-21 MED ORDER — TERBINAFINE HCL 250 MG PO TABS
250.0000 mg | ORAL_TABLET | Freq: Every day | ORAL | 0 refills | Status: DC
Start: 2023-03-21 — End: 2024-05-16

## 2023-03-21 MED ORDER — AZITHROMYCIN 250 MG PO TABS
250.0000 mg | ORAL_TABLET | Freq: Every day | ORAL | 0 refills | Status: DC
Start: 2023-03-21 — End: 2024-02-13

## 2023-03-21 NOTE — Patient Instructions (Signed)
Terbinafine Tablets What is this medication? TERBINAFINE (TER bin a feen) treats fungal infections of the nails. It belongs to a group of medications called antifungals. It will not treat infections caused by bacteria or viruses. This medicine may be used for other purposes; ask your health care provider or pharmacist if you have questions. COMMON BRAND NAME(S): Lamisil, Terbinex What should I tell my care team before I take this medication? They need to know if you have any of these conditions: Liver disease An unusual or allergic reaction to terbinafine, other medications, foods, dyes, or preservatives Pregnant or trying to get pregnant Breast-feeding How should I use this medication? Take this medication by mouth with water. Take it as directed on the prescription label at the same time every day. You can take it with or without food. If it upsets your stomach, take it with food. Keep taking it unless your care team tells you to stop. A special MedGuide will be given to you by the pharmacist with each prescription and refill. Be sure to read this information carefully each time. Talk to your care team regarding the use of this medication in children. Special care may be needed. Overdosage: If you think you have taken too much of this medicine contact a poison control center or emergency room at once. NOTE: This medicine is only for you. Do not share this medicine with others. What if I miss a dose? If you miss a dose, take it as soon as you can unless it is more than 4 hours late. If it is more than 4 hours late, skip the missed dose. Take the next dose at the normal time. What may interact with this medication? Do not take this medication with any of the following: Pimozide Thioridazine This medication may also interact with the following: Beta blockers Caffeine Certain medications for mental health conditions Cimetidine Cyclosporine Medications for fungal infections like fluconazole  and ketoconazole Medications for irregular heartbeat like amiodarone, flecainide and propafenone Rifampin Warfarin This list may not describe all possible interactions. Give your health care provider a list of all the medicines, herbs, non-prescription drugs, or dietary supplements you use. Also tell them if you smoke, drink alcohol, or use illegal drugs. Some items may interact with your medicine. What should I watch for while using this medication? Visit your care team for regular checks on your progress. You may need blood work while you are taking this medication. It may be some time before you see the benefit from this medication. This medication may cause serious skin reactions. They can happen weeks to months after starting the medication. Contact your care team right away if you notice fevers or flu-like symptoms with a rash. The rash may be red or purple and then turn into blisters or peeling of the skin. Or, you might notice a red rash with swelling of the face, lips or lymph nodes in your neck or under your arms. This medication can make you more sensitive to the sun. Keep out of the sun, If you cannot avoid being in the sun, wear protective clothing and sunscreen. Do not use sun lamps or tanning beds/booths. What side effects may I notice from receiving this medication? Side effects that you should report to your care team as soon as possible: Allergic reactions--skin rash, itching, hives, swelling of the face, lips, tongue, or throat Change in sense of smell Change in taste Infection--fever, chills, cough, or sore throat Liver injury--right upper belly pain, loss of appetite, nausea,   light-colored stool, dark yellow or brown urine, yellowing skin or eyes, unusual weakness or fatigue Low red blood cell level--unusual weakness or fatigue, dizziness, headache, trouble breathing Lupus-like syndrome--joint pain, swelling, or stiffness, butterfly-shaped rash on the face, rashes that get worse  in the sun, fever, unusual weakness or fatigue Rash, fever, and swollen lymph nodes Redness, blistering, peeling, or loosening of the skin, including inside the mouth Unusual bruising or bleeding Worsening mood, feelings of depression Side effects that usually do not require medical attention (report to your care team if they continue or are bothersome): Diarrhea Gas Headache Nausea Stomach pain Upset stomach This list may not describe all possible side effects. Call your doctor for medical advice about side effects. You may report side effects to FDA at 1-800-FDA-1088. Where should I keep my medication? Keep out of the reach of children and pets. Store between 20 and 25 degrees C (68 and 77 degrees F). Protect from light. Get rid of any unused medication after the expiration date. To get rid of medications that are no longer needed or have expired: Take the medication to a medication take-back program. Check with your pharmacy or law enforcement to find a location. If you cannot return the medication, check the label or package insert to see if the medication should be thrown out in the garbage or flushed down the toilet. If you are not sure, ask your care team. If it is safe to put it in the trash, take the medication out of the container. Mix the medication with cat litter, dirt, coffee grounds, or other unwanted substance. Seal the mixture in a bag or container. Put it in the trash. NOTE: This sheet is a summary. It may not cover all possible information. If you have questions about this medicine, talk to your doctor, pharmacist, or health care provider.  2023 Elsevier/Gold Standard (2021-06-21 00:00:00)  

## 2023-03-22 LAB — LIPID PANEL
Cholesterol: 189 mg/dL (ref <200)
HDL: 44 mg/dL — ABNORMAL LOW (ref >=50)
LDL Cholesterol (Calc): 120 mg/dL (calc) — ABNORMAL HIGH
Non-HDL Cholesterol (Calc): 145 mg/dL (calc) — ABNORMAL HIGH (ref <130)
Total CHOL/HDL Ratio: 4.3 (calc) (ref <5.0)
Triglycerides: 140 mg/dL (ref <150)

## 2023-03-22 LAB — URINALYSIS, ROUTINE W REFLEX MICROSCOPIC
Bilirubin Urine: NEGATIVE
Hgb urine dipstick: NEGATIVE
Ketones, ur: NEGATIVE
Leukocytes,Ua: NEGATIVE
Nitrite: NEGATIVE
Protein, ur: NEGATIVE
Specific Gravity, Urine: 1.028 (ref 1.001–1.035)
pH: <=5 (ref 5.0–8.0)

## 2023-03-22 LAB — HEMOGLOBIN A1C
Hgb A1c MFr Bld: 9.2 % of total Hgb — ABNORMAL HIGH (ref <5.7)
Mean Plasma Glucose: 217 mg/dL
eAG (mmol/L): 12 mmol/L

## 2023-03-22 LAB — CBC WITH DIFFERENTIAL/PLATELET
Basophils Absolute: 71 cells/uL (ref 0–200)
Basophils Relative: 1.2 %
Eosinophils Absolute: 142 cells/uL (ref 15–500)
Eosinophils Relative: 2.4 %
Hemoglobin: 15.9 g/dL — ABNORMAL HIGH (ref 11.7–15.5)
MCH: 30.1 pg (ref 27.0–33.0)
MCHC: 33.7 g/dL (ref 32.0–36.0)
MCV: 89.4 fL (ref 80.0–100.0)
Monocytes Relative: 5.4 %
Neutrophils Relative %: 31.7 %
Platelets: 271 10*3/uL (ref 140–400)
Total Lymphocyte: 59.3 %
WBC: 5.9 10*3/uL (ref 3.8–10.8)

## 2023-03-22 LAB — COMPLETE METABOLIC PANEL WITH GFR
AG Ratio: 1.4 (calc) (ref 1.0–2.5)
ALT: 44 U/L — ABNORMAL HIGH (ref 6–29)
AST: 31 U/L (ref 10–35)
Albumin: 4.1 g/dL (ref 3.6–5.1)
Alkaline phosphatase (APISO): 45 U/L (ref 37–153)
BUN: 24 mg/dL (ref 7–25)
CO2: 24 mmol/L (ref 20–32)
Calcium: 9.6 mg/dL (ref 8.6–10.4)
Chloride: 106 mmol/L (ref 98–110)
Creat: 0.93 mg/dL (ref 0.50–1.03)
Globulin: 3 g/dL (calc) (ref 1.9–3.7)
Glucose, Bld: 159 mg/dL — ABNORMAL HIGH (ref 65–99)
Potassium: 4.6 mmol/L (ref 3.5–5.3)
Sodium: 138 mmol/L (ref 135–146)
Total Bilirubin: 0.5 mg/dL (ref 0.2–1.2)
Total Protein: 7.1 g/dL (ref 6.1–8.1)
eGFR: 71 mL/min/{1.73_m2} (ref >=60)

## 2023-03-22 LAB — VITAMIN D 25 HYDROXY (VIT D DEFICIENCY, FRACTURES): Vit D, 25-Hydroxy: 53 ng/mL (ref 30–100)

## 2023-03-22 LAB — TSH: TSH: 2.38 mIU/L (ref 0.40–4.50)

## 2023-03-22 LAB — MICROALBUMIN / CREATININE URINE RATIO
Creatinine, Urine: 51 mg/dL (ref 20–275)
Microalb Creat Ratio: 6 mg/g creat (ref <30)
Microalb, Ur: 0.3 mg/dL

## 2023-03-22 LAB — MAGNESIUM: Magnesium: 2 mg/dL (ref 1.5–2.5)

## 2023-04-11 ENCOUNTER — Other Ambulatory Visit: Payer: Self-pay | Admitting: Nurse Practitioner

## 2023-04-18 ENCOUNTER — Other Ambulatory Visit: Payer: No Typology Code available for payment source

## 2023-04-27 ENCOUNTER — Other Ambulatory Visit: Payer: Self-pay

## 2023-04-27 ENCOUNTER — Other Ambulatory Visit: Payer: No Typology Code available for payment source

## 2023-04-27 DIAGNOSIS — Z79899 Other long term (current) drug therapy: Secondary | ICD-10-CM

## 2023-04-28 LAB — HEPATIC FUNCTION PANEL
AG Ratio: 1.5 (calc) (ref 1.0–2.5)
ALT: 35 U/L — ABNORMAL HIGH (ref 6–29)
AST: 26 U/L (ref 10–35)
Albumin: 4.1 g/dL (ref 3.6–5.1)
Alkaline phosphatase (APISO): 48 U/L (ref 37–153)
Bilirubin, Direct: 0.1 mg/dL (ref 0.0–0.2)
Globulin: 2.7 g/dL (calc) (ref 1.9–3.7)
Indirect Bilirubin: 0.5 mg/dL (calc) (ref 0.2–1.2)
Total Bilirubin: 0.6 mg/dL (ref 0.2–1.2)
Total Protein: 6.8 g/dL (ref 6.1–8.1)

## 2023-05-06 ENCOUNTER — Other Ambulatory Visit: Payer: Self-pay | Admitting: Nurse Practitioner

## 2023-05-06 DIAGNOSIS — E1169 Type 2 diabetes mellitus with other specified complication: Secondary | ICD-10-CM

## 2023-06-24 ENCOUNTER — Other Ambulatory Visit: Payer: Self-pay | Admitting: Nurse Practitioner

## 2023-06-24 DIAGNOSIS — N183 Chronic kidney disease, stage 3 unspecified: Secondary | ICD-10-CM

## 2023-09-08 ENCOUNTER — Other Ambulatory Visit: Payer: Self-pay | Admitting: Nurse Practitioner

## 2023-09-08 DIAGNOSIS — J302 Other seasonal allergic rhinitis: Secondary | ICD-10-CM

## 2023-09-09 ENCOUNTER — Other Ambulatory Visit: Payer: Self-pay | Admitting: Nurse Practitioner

## 2023-09-09 DIAGNOSIS — E1169 Type 2 diabetes mellitus with other specified complication: Secondary | ICD-10-CM

## 2023-10-08 ENCOUNTER — Other Ambulatory Visit: Payer: Self-pay | Admitting: Nurse Practitioner

## 2023-10-29 ENCOUNTER — Other Ambulatory Visit: Payer: Self-pay | Admitting: Nurse Practitioner

## 2023-10-29 DIAGNOSIS — N183 Chronic kidney disease, stage 3 unspecified: Secondary | ICD-10-CM

## 2024-01-08 ENCOUNTER — Other Ambulatory Visit: Payer: Self-pay | Admitting: Nurse Practitioner

## 2024-01-08 DIAGNOSIS — K2100 Gastro-esophageal reflux disease with esophagitis, without bleeding: Secondary | ICD-10-CM

## 2024-02-13 NOTE — Progress Notes (Unsigned)
 New patient visit   Patient: Andrea Bush   DOB: 1966-10-11   58 y.o. Female  MRN: [id number] Visit Date: 02/14/2024  Today's healthcare provider: Alfredia Ferguson, PA-C   No chief complaint on file.  Subjective    Andrea Bush is a 58 y.o. female who presents today as a new patient to establish care.   Htn- lisinopril 20, Dm- trulicity 1/5 jardiance 25 9.2 4/24 Asthma?- singulair 10   H/o pe xarelto No statin? H/o declines  Past Medical History:  Diagnosis Date   Clotting disorder (HCC)    Fibrocystic breast    Hyperlipidemia    Hypertension    Hypothyroidism    Iron deficiency anemia    Obesity    BMI 41   Prediabetes    Pulmonary embolism (HCC) 2006   from BCP   Vitamin D deficiency    Past Surgical History:  Procedure Laterality Date   ABLATION  2006   THYROID SURGERY     Nodule benign   TONSILLECTOMY AND ADENOIDECTOMY     TUBAL LIGATION Bilateral    Family Status  Relation Name Status   Mother  (Not Specified)   Father  (Not Specified)   Neg Hx  (Not Specified)  No partnership data on file   Family History  Problem Relation Age of Onset   Cancer Mother        Cancer   Osteoporosis Mother    Diabetes Father    Colon cancer Neg Hx    Stomach cancer Neg Hx    Pancreatic cancer Neg Hx    Esophageal cancer Neg Hx    Social History   Socioeconomic History   Marital status: Single    Spouse name: Not on file   Number of children: Not on file   Years of education: Not on file   Highest education level: Not on file  Occupational History   Not on file  Tobacco Use   Smoking status: Never   Smokeless tobacco: Never  Vaping Use   Vaping status: Never Used  Substance and Sexual Activity   Alcohol use: No   Drug use: No   Sexual activity: Not on file  Other Topics Concern   Not on file  Social History Narrative   Not on file   Social Drivers of Health   Financial Resource Strain: Not on file  Food Insecurity: Not on file   Transportation Needs: Not on file  Physical Activity: Not on file  Stress: Not on file  Social Connections: Not on file   Outpatient Medications Prior to Visit  Medication Sig   azithromycin (ZITHROMAX) 250 MG tablet Take 1 tablet (250 mg total) by mouth daily. Take 2 tablets PO on day 1, then 1 tablet PO Q24H x 4 days   Calcium Carbonate-Vitamin D (CALCIUM-VITAMIN D3 PO) Take 500 mg by mouth daily.   cetirizine (ZYRTEC) 10 MG tablet Take 10 mg by mouth daily.   cyclobenzaprine (FLEXERIL) 10 MG tablet Take 1 tablet (10 mg total) by mouth 3 (three) times daily as needed for muscle spasms.   Dulaglutide (TRULICITY) 1.5 MG/0.5ML SOAJ INJECT 1.5 MG INTO THE SKIN ONCE A WEEK.   empagliflozin (JARDIANCE) 25 MG TABS tablet Take  1 tablet  Daily for Diabetes                                                                          /  TAKE                                     BY                                           MOUTH   famotidine (PEPCID) 20 MG tablet TAKE 1 TABLET TWICE A DAY AS NEEDED FOR ACID INDIGESTION & REFLUX   fluticasone (FLONASE) 50 MCG/ACT nasal spray Place into both nostrils daily.   lisinopril (ZESTRIL) 20 MG tablet TAKE 1 TABLET DAILY FOR BLOOD PRESSURE & DIABETIC KIDNEY PROTECTION   montelukast (SINGULAIR) 10 MG tablet TAKE 1 TABLET BY MOUTH EVERY DAY   naproxen sodium (ALEVE) 220 MG tablet Take 220 mg by mouth.   Phenylephrine-Aspirin (ALKA-SELTZER PLUS SINUS PO) Take by mouth.   Saw Palmetto, Serenoa repens, (SAW PALMETTO PO) Take by mouth.   terbinafine (LAMISIL) 250 MG tablet Take 1 tablet (250 mg total) by mouth daily. Take 1 pill daily for one month, off for 1 month, one a month, etc until gone   XARELTO 20 MG TABS tablet TAKE 1 TABLET DAILY TO PREVENT BLOOD CLOTS - MUST HAVE OFFICE VISIT BEFORE REFILL   No facility-administered medications prior to visit.   Allergies  Allergen Reactions   Farxiga [Dapagliflozin] Other  (See Comments)    Severe sinus infeciton   Crestor [Rosuvastatin]     Hair Loss    Penicillins Rash    Did it involve swelling of the face/tongue/throat, SOB, or low BP? No Did it involve sudden or severe rash/hives, skin peeling, or any reaction on the inside of your mouth or nose? No Did you need to seek medical attention at a hospital or doctor's office? No When did it last happen?       If all above answers are "NO", may proceed with cephalosporin use.    Immunization History  Administered Date(s) Administered   Influenza,inj,Quad PF,6+ Mos 09/06/2019   PFIZER(Purple Top)SARS-COV-2 Vaccination 06/16/2020   Pneumococcal-Unspecified 12/12/2003   Tdap 06/10/2012   Zoster Recombinant(Shingrix) 09/21/2023    Health Maintenance  Topic Date Due   Pneumococcal Vaccine 26-73 Years old (1 of 2 - PCV) 03/31/1970   OPHTHALMOLOGY EXAM  Never done   MAMMOGRAM  Never done   Cervical Cancer Screening (HPV/Pap Cotest)  06/29/2016   COVID-19 Vaccine (2 - Pfizer risk series) 07/07/2020   FOOT EXAM  03/05/2021   DTaP/Tdap/Td (2 - Td or Tdap) 06/10/2022   INFLUENZA VACCINE  07/12/2023   HEMOGLOBIN A1C  09/20/2023   Zoster Vaccines- Shingrix (2 of 2) 11/16/2023   Diabetic kidney evaluation - eGFR measurement  03/20/2024   Diabetic kidney evaluation - Urine ACR  03/20/2024   Colonoscopy  09/24/2030   Hepatitis C Screening  Completed   HIV Screening  Completed   HPV VACCINES  Aged Out    Patient Care Team: Lucky Cowboy, MD as PCP - General (Internal Medicine)  Review of Systems  {Insert previous labs (optional):23779} {See past labs  Heme  Chem  Endocrine  Serology  Results Review (optional):1}   Objective    There were no vitals taken for this visit. {Insert last BP/Wt (optional):23777}{See vitals history (optional):1}   Physical Exam ***  Depression Screen    03/05/2020   10:33 AM  PHQ 2/9 Scores  PHQ - 2 Score 0   No results found for any visits on 02/14/24.   Assessment & Plan     There are no diagnoses linked to this encounter.   No follow-ups on file.      Alfredia Ferguson, PA-C  [facility] [phone] (phone) [phone] (fax)  [facility]

## 2024-02-14 ENCOUNTER — Ambulatory Visit: Payer: No Typology Code available for payment source | Admitting: Physician Assistant

## 2024-02-14 ENCOUNTER — Encounter: Payer: Self-pay | Admitting: Physician Assistant

## 2024-02-14 VITALS — BP 134/93 | HR 97 | Ht 68.0 in | Wt 266.8 lb

## 2024-02-14 DIAGNOSIS — E1169 Type 2 diabetes mellitus with other specified complication: Secondary | ICD-10-CM | POA: Diagnosis not present

## 2024-02-14 DIAGNOSIS — N182 Chronic kidney disease, stage 2 (mild): Secondary | ICD-10-CM | POA: Diagnosis not present

## 2024-02-14 DIAGNOSIS — E1122 Type 2 diabetes mellitus with diabetic chronic kidney disease: Secondary | ICD-10-CM | POA: Diagnosis not present

## 2024-02-14 DIAGNOSIS — M542 Cervicalgia: Secondary | ICD-10-CM

## 2024-02-14 DIAGNOSIS — Z86711 Personal history of pulmonary embolism: Secondary | ICD-10-CM

## 2024-02-14 DIAGNOSIS — I10 Essential (primary) hypertension: Secondary | ICD-10-CM

## 2024-02-14 DIAGNOSIS — E785 Hyperlipidemia, unspecified: Secondary | ICD-10-CM

## 2024-02-14 MED ORDER — CYCLOBENZAPRINE HCL 10 MG PO TABS: 10.0000 mg | ORAL_TABLET | Freq: Three times a day (TID) | ORAL | 1 refills | Status: AC | PRN

## 2024-02-14 NOTE — Assessment & Plan Note (Signed)
 Last A1c 4/24 was 9.2%.  Patient manages with Trulicity 1.5 mg weekly, Jardiance 25 mg.  Reports history of intolerance to metformin secondary to diarrhea.  She reports she has not checked her blood sugar regularly and admits to poor diet and lifestyle.  Order UACR today, patient reports history of statin intolerance, not on statin.  On ACE inhibitor. F/u 3 mo .  Med changes pending A1c, briefly discussed transitioning to [facility] if A1c is still over 7%.

## 2024-02-14 NOTE — Assessment & Plan Note (Addendum)
 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 Now managed on Xarelto indefinitely

## 2024-02-14 NOTE — Assessment & Plan Note (Signed)
 On ACE inhibitor, repeat CMP today

## 2024-02-14 NOTE — Assessment & Plan Note (Signed)
 Slightly elevated in office.  Patient manages with lisinopril 20 mg.  Will order CMP.  Will continue to monitor in office and adjust lisinopril as indicated.  F/u 52mo

## 2024-02-14 NOTE — Assessment & Plan Note (Signed)
 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  Manages with Flexeril as needed

## 2024-02-14 NOTE — Assessment & Plan Note (Signed)
 History of statin intolerance secondary to hair loss.  She reports trying Crestor and potentially Lipitor. Repeat lipid panel

## 2024-02-15 LAB — COMPREHENSIVE METABOLIC PANEL
ALT: 53 U/L — ABNORMAL HIGH (ref 0–35)
AST: 40 U/L — ABNORMAL HIGH (ref 0–37)
Albumin: 4.3 g/dL (ref 3.5–5.2)
Alkaline Phosphatase: 48 U/L (ref 39–117)
BUN: 19 mg/dL (ref 6–23)
CO2: 27 meq/L (ref 19–32)
Calcium: 9.6 mg/dL (ref 8.4–10.5)
Chloride: 102 meq/L (ref 96–112)
Creatinine, Ser: 0.98 mg/dL (ref 0.40–1.20)
GFR: 63.02 mL/min (ref >60.00)
Glucose, Bld: 143 mg/dL — ABNORMAL HIGH (ref 70–99)
Potassium: 4 meq/L (ref 3.5–5.1)
Sodium: 139 meq/L (ref 135–145)
Total Bilirubin: 0.5 mg/dL (ref 0.2–1.2)
Total Protein: 7.7 g/dL (ref 6.0–8.3)

## 2024-02-15 LAB — CBC WITH DIFFERENTIAL/PLATELET
Basophils Absolute: 0 10*3/uL (ref 0.0–0.1)
Basophils Relative: 0.6 % (ref 0.0–3.0)
Eosinophils Absolute: 0.1 10*3/uL (ref 0.0–0.7)
Eosinophils Relative: 1.8 % (ref 0.0–5.0)
HCT: 49.1 % — ABNORMAL HIGH (ref 36.0–46.0)
Hemoglobin: 16.5 g/dL — ABNORMAL HIGH (ref 12.0–15.0)
Lymphocytes Relative: 43.2 % (ref 12.0–46.0)
Lymphs Abs: 3.4 10*3/uL (ref 0.7–4.0)
MCHC: 33.7 g/dL (ref 30.0–36.0)
MCV: 93.7 fl (ref 78.0–100.0)
Monocytes Absolute: 0.4 10*3/uL (ref 0.1–1.0)
Monocytes Relative: 5.4 % (ref 3.0–12.0)
Neutro Abs: 3.8 10*3/uL (ref 1.4–7.7)
Neutrophils Relative %: 49 % (ref 43.0–77.0)
Platelets: 301 10*3/uL (ref 150.0–400.0)
RBC: 5.24 Mil/uL — ABNORMAL HIGH (ref 3.87–5.11)
RDW: 13.3 % (ref 11.5–15.5)
WBC: 7.8 10*3/uL (ref 4.0–10.5)

## 2024-02-15 LAB — MICROALBUMIN / CREATININE URINE RATIO
Creatinine,U: 97.4 mg/dL
Microalb Creat Ratio: 7.2 mg/g (ref 0.0–30.0)
Microalb, Ur: <0.7 mg/dL (ref 0.0–1.9)

## 2024-02-15 LAB — LIPID PANEL
Cholesterol: 243 mg/dL — ABNORMAL HIGH (ref 0–200)
HDL: 51 mg/dL (ref >39.00)
LDL Cholesterol: 142 mg/dL — ABNORMAL HIGH (ref 0–99)
NonHDL: 192.19
Total CHOL/HDL Ratio: 5
Triglycerides: 253 mg/dL — ABNORMAL HIGH (ref 0.0–149.0)
VLDL: 50.6 mg/dL — ABNORMAL HIGH (ref 0.0–40.0)

## 2024-02-15 LAB — HEMOGLOBIN A1C: Hgb A1c MFr Bld: 8.4 % — ABNORMAL HIGH (ref 4.6–6.5)

## 2024-02-18 ENCOUNTER — Encounter: Payer: Self-pay | Admitting: Physician Assistant

## 2024-02-18 ENCOUNTER — Encounter: Payer: Self-pay | Admitting: *Deleted

## 2024-02-18 ENCOUNTER — Other Ambulatory Visit: Payer: Self-pay | Admitting: Physician Assistant

## 2024-02-18 DIAGNOSIS — Z86711 Personal history of pulmonary embolism: Secondary | ICD-10-CM

## 2024-02-18 DIAGNOSIS — R748 Abnormal levels of other serum enzymes: Secondary | ICD-10-CM

## 2024-02-18 DIAGNOSIS — D582 Other hemoglobinopathies: Secondary | ICD-10-CM

## 2024-02-18 DIAGNOSIS — E1169 Type 2 diabetes mellitus with other specified complication: Secondary | ICD-10-CM

## 2024-02-18 MED ORDER — TRULICITY 3 MG/0.5ML ~~LOC~~ SOAJ
3.0000 mg | SUBCUTANEOUS | 1 refills | Status: AC
Start: 2024-02-18 — End: ?

## 2024-02-26 ENCOUNTER — Encounter: Payer: Self-pay | Admitting: Hematology & Oncology

## 2024-03-18 ENCOUNTER — Other Ambulatory Visit: Payer: Self-pay | Admitting: *Deleted

## 2024-03-18 DIAGNOSIS — J302 Other seasonal allergic rhinitis: Secondary | ICD-10-CM

## 2024-03-18 MED ORDER — MONTELUKAST SODIUM 10 MG PO TABS: 10.0000 mg | ORAL_TABLET | Freq: Every day | ORAL | 1 refills | Status: DC

## 2024-03-18 MED ORDER — LISINOPRIL 20 MG PO TABS: ORAL_TABLET | ORAL | 1 refills | Status: DC

## 2024-03-20 ENCOUNTER — Encounter: Payer: No Typology Code available for payment source | Admitting: Nurse Practitioner

## 2024-03-27 ENCOUNTER — Other Ambulatory Visit: Payer: Self-pay | Admitting: *Deleted

## 2024-03-27 DIAGNOSIS — E1169 Type 2 diabetes mellitus with other specified complication: Secondary | ICD-10-CM

## 2024-03-27 MED ORDER — EMPAGLIFLOZIN 25 MG PO TABS: ORAL_TABLET | ORAL | 3 refills | Status: AC

## 2024-04-01 ENCOUNTER — Other Ambulatory Visit (HOSPITAL_COMMUNITY): Payer: Self-pay

## 2024-04-01 ENCOUNTER — Telehealth: Payer: Self-pay | Admitting: *Deleted

## 2024-04-01 NOTE — Telephone Encounter (Signed)
 Can you check to see if a PA is needed on pt's Jardiance ?  I refilled this for her last week but [facility] is telling her they are "waiting on provider".  Thanks!

## 2024-04-02 ENCOUNTER — Other Ambulatory Visit (HOSPITAL_COMMUNITY): Payer: Self-pay

## 2024-04-02 ENCOUNTER — Telehealth: Payer: Self-pay

## 2024-04-02 NOTE — Telephone Encounter (Signed)
 Pharmacy Patient Advocate Encounter   Received notification from Patient Advice Request messages that prior authorization for Jardiance  is required/requested.   Insurance verification completed.   The patient is insured through [facility] ADVANTAGE/RX ADVANCE .   Per test claim: PA required; PA submitted to above mentioned insurance via CoverMyMeds Key/confirmation #/EOC [id number] Status is pending

## 2024-04-02 NOTE — Telephone Encounter (Signed)
 Yes, unfortunately, PA is needed, I have submitted the PA, hopefully we will hear back soon, can sometimes take up to a week though. Thanks!

## 2024-04-03 ENCOUNTER — Other Ambulatory Visit (HOSPITAL_COMMUNITY): Payer: Self-pay

## 2024-04-03 NOTE — Telephone Encounter (Signed)
 Pharmacy Patient Advocate Encounter  Received notification from [facility] that Prior Authorization for Jardiance  25MG  tablets has been APPROVED from 04/03/24 to 04/04/27   PA #/Case ID/Reference #: [id number]

## 2024-04-16 ENCOUNTER — Other Ambulatory Visit: Payer: Self-pay | Admitting: Physician Assistant

## 2024-05-16 ENCOUNTER — Encounter: Payer: Self-pay | Admitting: Physician Assistant

## 2024-05-16 ENCOUNTER — Ambulatory Visit (INDEPENDENT_AMBULATORY_CARE_PROVIDER_SITE_OTHER): Admitting: Physician Assistant

## 2024-05-16 VITALS — BP 109/73 | HR 89 | Ht 68.0 in | Wt 266.0 lb

## 2024-05-16 DIAGNOSIS — I10 Essential (primary) hypertension: Secondary | ICD-10-CM | POA: Diagnosis not present

## 2024-05-16 DIAGNOSIS — E1169 Type 2 diabetes mellitus with other specified complication: Secondary | ICD-10-CM

## 2024-05-16 DIAGNOSIS — Z0000 Encounter for general adult medical examination without abnormal findings: Secondary | ICD-10-CM

## 2024-05-16 DIAGNOSIS — D751 Secondary polycythemia: Secondary | ICD-10-CM

## 2024-05-16 DIAGNOSIS — E785 Hyperlipidemia, unspecified: Secondary | ICD-10-CM

## 2024-05-16 DIAGNOSIS — E1122 Type 2 diabetes mellitus with diabetic chronic kidney disease: Secondary | ICD-10-CM

## 2024-05-16 DIAGNOSIS — Z1231 Encounter for screening mammogram for malignant neoplasm of breast: Secondary | ICD-10-CM

## 2024-05-16 DIAGNOSIS — D582 Other hemoglobinopathies: Secondary | ICD-10-CM

## 2024-05-16 DIAGNOSIS — N182 Chronic kidney disease, stage 2 (mild): Secondary | ICD-10-CM | POA: Diagnosis not present

## 2024-05-16 LAB — COMPREHENSIVE METABOLIC PANEL WITH GFR
ALT: 52 U/L — ABNORMAL HIGH (ref 0–35)
AST: 34 U/L (ref 0–37)
Albumin: 4.2 g/dL (ref 3.5–5.2)
Alkaline Phosphatase: 45 U/L (ref 39–117)
BUN: 22 mg/dL (ref 6–23)
CO2: 28 meq/L (ref 19–32)
Calcium: 9.5 mg/dL (ref 8.4–10.5)
Chloride: 102 meq/L (ref 96–112)
Creatinine, Ser: 1.01 mg/dL (ref 0.40–1.20)
GFR: 60.67 mL/min (ref >60.00)
Glucose, Bld: 163 mg/dL — ABNORMAL HIGH (ref 70–99)
Potassium: 4.1 meq/L (ref 3.5–5.1)
Sodium: 137 meq/L (ref 135–145)
Total Bilirubin: 0.6 mg/dL (ref 0.2–1.2)
Total Protein: 7.2 g/dL (ref 6.0–8.3)

## 2024-05-16 LAB — CBC WITH DIFFERENTIAL/PLATELET
Basophils Absolute: 0.1 10*3/uL (ref 0.0–0.1)
Basophils Relative: 0.8 % (ref 0.0–3.0)
Eosinophils Absolute: 0.1 10*3/uL (ref 0.0–0.7)
Eosinophils Relative: 1.8 % (ref 0.0–5.0)
HCT: 46.7 % — ABNORMAL HIGH (ref 36.0–46.0)
Hemoglobin: 15.7 g/dL — ABNORMAL HIGH (ref 12.0–15.0)
Lymphocytes Relative: 45.1 % (ref 12.0–46.0)
Lymphs Abs: 2.9 10*3/uL (ref 0.7–4.0)
MCHC: 33.6 g/dL (ref 30.0–36.0)
MCV: 91.5 fl (ref 78.0–100.0)
Monocytes Absolute: 0.5 10*3/uL (ref 0.1–1.0)
Monocytes Relative: 7.1 % (ref 3.0–12.0)
Neutro Abs: 2.9 10*3/uL (ref 1.4–7.7)
Neutrophils Relative %: 45.2 % (ref 43.0–77.0)
Platelets: 278 10*3/uL (ref 150.0–400.0)
RBC: 5.1 Mil/uL (ref 3.87–5.11)
RDW: 13.1 % (ref 11.5–15.5)
WBC: 6.5 10*3/uL (ref 4.0–10.5)

## 2024-05-16 LAB — LIPID PANEL
Cholesterol: 227 mg/dL — ABNORMAL HIGH (ref 0–200)
HDL: 50.9 mg/dL (ref >39.00)
LDL Cholesterol: 139 mg/dL — ABNORMAL HIGH (ref 0–99)
NonHDL: 175.79
Total CHOL/HDL Ratio: 4
Triglycerides: 184 mg/dL — ABNORMAL HIGH (ref 0.0–149.0)
VLDL: 36.8 mg/dL (ref 0.0–40.0)

## 2024-05-16 LAB — HEMOGLOBIN A1C: Hgb A1c MFr Bld: 8.3 % — ABNORMAL HIGH (ref 4.6–6.5)

## 2024-05-16 NOTE — Assessment & Plan Note (Signed)
 Repeat cmp, on acei

## 2024-05-16 NOTE — Assessment & Plan Note (Signed)
 H/o of PE, on xarelto .  Had referred to heme for management, pt never made appt. Re-referred today.

## 2024-05-16 NOTE — Assessment & Plan Note (Signed)
 Manages with trulicity  3 mg, jardiance  25 mg Lab Results  Component Value Date   HGBA1C 8.4 (H) 02/14/2024   HGBA1C 9.2 (H) 03/21/2023   HGBA1C 7.7 (H) 10/31/2022  Pt aware goal is < 7%.  Intolerant to statin. On acei

## 2024-05-16 NOTE — Assessment & Plan Note (Signed)
 Well controlled Cont lisinopril  20 mg Repeat cmp F/u 6 mo

## 2024-05-16 NOTE — Progress Notes (Signed)
 Complete physical exam   Patient: Andrea Bush   DOB: 21-Jul-1966   58 y.o. Female  MRN: [id number] Visit Date: 05/16/2024  Today's healthcare provider: Trenton Frock, PA-C   Cc. cpe  Subjective    Andrea Bush is a 58 y.o. female who presents today for a complete physical exam.  Discussed the use of AI scribe software for clinical note transcription with the patient, who gave verbal consent to proceed.  History of Present Illness   Andrea Bush is a 58 year old female who presents for an annual physical exam and blood work.  She leads a sedentary lifestyle, sitting most of the day for work and after work. She does not check her blood sugar.  She plans to schedule a mammogram and is due for her annual gynecological exam. She declines a tetanus shot today, preferring [facility].      Past Medical History:  Diagnosis Date   Clotting disorder (HCC)    Fibrocystic breast    Hyperlipidemia    Hypertension    Hypothyroidism    Iron deficiency anemia    Obesity    BMI 41   Prediabetes    Pulmonary embolism (HCC) 2006   from BCP   Vitamin D  deficiency    Past Surgical History:  Procedure Laterality Date   ABLATION  2006   THYROID  SURGERY     Nodule benign   TONSILLECTOMY AND ADENOIDECTOMY     TUBAL LIGATION Bilateral    Social History   Socioeconomic History   Marital status: Single    Spouse name: Not on file   Number of children: Not on file   Years of education: Not on file   Highest education level: Not on file  Occupational History   Not on file  Tobacco Use   Smoking status: Never   Smokeless tobacco: Never  Vaping Use   Vaping status: Never Used  Substance and Sexual Activity   Alcohol use: No   Drug use: No   Sexual activity: Not on file  Other Topics Concern   Not on file  Social History Narrative   Not on file   Social Drivers of Health   Financial Resource Strain: Not on file  Food Insecurity: Not on file   Transportation Needs: Not on file  Physical Activity: Not on file  Stress: Not on file  Social Connections: Not on file  Intimate Partner Violence: Not on file   Family Status  Relation Name Status   Mother  (Not Specified)   Father  (Not Specified)   Neg Hx  (Not Specified)  No partnership data on file   Family History  Problem Relation Age of Onset   Cancer Mother        Cancer   Osteoporosis Mother    Diabetes Father    Colon cancer Neg Hx    Stomach cancer Neg Hx    Pancreatic cancer Neg Hx    Esophageal cancer Neg Hx    Allergies  Allergen Reactions   Farxiga  [Dapagliflozin ] Other (See Comments)    Severe sinus infeciton   Crestor  [Rosuvastatin ]     Hair Loss    Penicillins Rash    Did it involve swelling of the face/tongue/throat, SOB, or low BP? No Did it involve sudden or severe rash/hives, skin peeling, or any reaction on the inside of your mouth or nose? No Did you need to seek medical attention at a hospital or  doctor's office? No When did it last happen?       If all above answers are "NO", may proceed with cephalosporin use.    Patient Care Team: Ginger Lai as PCP - General (Physician Assistant)   Medications: Outpatient Medications Prior to Visit  Medication Sig   Calcium  Carbonate-Vitamin D  (CALCIUM -VITAMIN D3 PO) Take 500 mg by mouth daily.   cetirizine (ZYRTEC) 10 MG tablet Take 10 mg by mouth daily.   cyclobenzaprine  (FLEXERIL ) 10 MG tablet Take 1 tablet (10 mg total) by mouth 3 (three) times daily as needed for muscle spasms.   Dulaglutide  (TRULICITY ) 3 MG/0.5ML SOAJ Inject 3 mg as directed once a week.   empagliflozin  (JARDIANCE ) 25 MG TABS tablet Take  1 tablet  Daily for Diabetes                                                                          /                                                     TAKE                                     BY                                           MOUTH   famotidine  (PEPCID ) 20 MG tablet TAKE  1 TABLET TWICE A DAY AS NEEDED FOR ACID INDIGESTION & REFLUX   fluticasone (FLONASE) 50 MCG/ACT nasal spray Place into both nostrils daily.   lisinopril  (ZESTRIL ) 20 MG tablet TAKE 1 TABLET DAILY FOR BLOOD PRESSURE & DIABETIC KIDNEY PROTECTION   montelukast  (SINGULAIR ) 10 MG tablet Take 1 tablet (10 mg total) by mouth daily.   naproxen sodium (ALEVE) 220 MG tablet Take 220 mg by mouth.   Phenylephrine-Aspirin  (ALKA-SELTZER PLUS SINUS PO) Take by mouth.   Saw Palmetto, Serenoa repens, (SAW PALMETTO PO) Take by mouth.   XARELTO  20 MG TABS tablet TAKE 1 TABLET DAILY TO PREVENT BLOOD CLOTS - MUST HAVE OFFICE VISIT BEFORE REFILL   [DISCONTINUED] terbinafine  (LAMISIL ) 250 MG tablet Take 1 tablet (250 mg total) by mouth daily. Take 1 pill daily for one month, off for 1 month, one a month, etc until gone   No facility-administered medications prior to visit.    Review of Systems  Constitutional:  Negative for fatigue and fever.  Respiratory:  Negative for cough and shortness of breath.   Cardiovascular:  Negative for chest pain and leg swelling.  Gastrointestinal:  Negative for abdominal pain.  Neurological:  Negative for dizziness and headaches.      Objective    BP 109/73   Pulse 89   Ht 5\' 8"  (1.727 m)   Wt 266 lb (120.7 kg)   BMI 40.45 kg/m    Physical Exam Constitutional:  General: She is awake.     Appearance: She is well-developed. She is not ill-appearing.  HENT:     Head: Normocephalic.     Right Ear: Tympanic membrane normal.     Left Ear: Tympanic membrane normal.     Nose: Nose normal. No congestion or rhinorrhea.     Mouth/Throat:     Pharynx: No oropharyngeal exudate or posterior oropharyngeal erythema.  Eyes:     Conjunctiva/sclera: Conjunctivae normal.     Pupils: Pupils are equal, round, and reactive to light.  Neck:     Thyroid : No thyroid  mass or thyromegaly.  Cardiovascular:     Rate and Rhythm: Normal rate and regular rhythm.     Pulses:           Dorsalis pedis pulses are 3+ on the right side and 3+ on the left side.       Posterior tibial pulses are 3+ on the right side and 3+ on the left side.     Heart sounds: Normal heart sounds.  Pulmonary:     Effort: Pulmonary effort is normal.     Breath sounds: Normal breath sounds.  Abdominal:     Palpations: Abdomen is soft.     Tenderness: There is no abdominal tenderness.  Musculoskeletal:     Right lower leg: No swelling. No edema.     Left lower leg: No swelling. No edema.  Feet:     Right foot:     Protective Sensation: 4 sites tested.  4 sites sensed.     Skin integrity: Skin integrity normal.     Toenail Condition: Right toenails are normal.     Left foot:     Protective Sensation: 4 sites tested.  4 sites sensed.     Skin integrity: Skin integrity normal.     Toenail Condition: Left toenails are normal.  Lymphadenopathy:     Cervical: No cervical adenopathy.  Skin:    General: Skin is warm.  Neurological:     Mental Status: She is alert and oriented to person, place, and time.  Psychiatric:        Attention and Perception: Attention normal.        Mood and Affect: Mood normal.        Speech: Speech normal.        Behavior: Behavior normal. Behavior is cooperative.     Last depression screening scores    05/16/2024    8:23 AM 02/14/2024    3:43 PM 03/05/2020   10:33 AM  PHQ 2/9 Scores  PHQ - 2 Score 0 0 0   Last fall risk screening    05/16/2024    8:23 AM  Fall Risk   Falls in the past year? 0  Number falls in past yr: 0  Injury with Fall? 0  Risk for fall due to : No Fall Risks  Follow up Falls evaluation completed   Last Audit-C alcohol use screening     No data to display         A score of 3 or more in women, and 4 or more in men indicates increased risk for alcohol abuse, EXCEPT if all of the points are from question 1   No results found for any visits on 05/16/24.  Assessment & Plan    Routine Health Maintenance and Physical Exam  Exercise  Activities and Dietary recommendations   --balanced diet high in fiber and protein, low in sugars, carbs, fats. --physical activity/exercise 20-30  minutes 3-5 times a week    Immunization History  Administered Date(s) Administered   Influenza,inj,Quad PF,6+ Mos 09/06/2019   PFIZER(Purple Top)SARS-COV-2 Vaccination 05/26/2020, 06/16/2020   Pneumococcal-Unspecified 12/12/2003   Tdap 06/10/2012   Zoster Recombinant(Shingrix) 06/22/2023, 09/21/2023    Health Maintenance  Topic Date Due   OPHTHALMOLOGY EXAM  Never done   Pneumococcal Vaccine 24-82 Years old (1 of 2 - PCV) 04/01/1983   COVID-19 Vaccine (3 - Pfizer risk series) 07/14/2020   DTaP/Tdap/Td (2 - Td or Tdap) 06/10/2022   Cervical Cancer Screening (HPV/Pap Cotest)  11/22/2023   MAMMOGRAM  04/27/2024   INFLUENZA VACCINE  07/11/2024   HEMOGLOBIN A1C  08/16/2024   Diabetic kidney evaluation - eGFR measurement  02/13/2025   Diabetic kidney evaluation - Urine ACR  02/13/2025   FOOT EXAM  05/16/2025   Colonoscopy  09/24/2030   Hepatitis C Screening  Completed   HIV Screening  Completed   Zoster Vaccines- Shingrix  Completed   HPV VACCINES  Aged Out   Meningococcal B Vaccine  Aged Out    Discussed health benefits of physical activity, and encouraged her to engage in regular exercise appropriate for her age and condition.  Problem List Items Addressed This Visit       Cardiovascular and Mediastinum   Hypertension   Well controlled Cont lisinopril  20 mg Repeat cmp F/u 6 mo        Endocrine   Type 2 diabetes mellitus with hyperlipidemia (HCC)   Manages with trulicity  3 mg, jardiance  25 mg Lab Results  Component Value Date   HGBA1C 8.4 (H) 02/14/2024   HGBA1C 9.2 (H) 03/21/2023   HGBA1C 7.7 (H) 10/31/2022  Pt aware goal is < 7%.  Intolerant to statin. On acei       Relevant Orders   Comp Met (CMET)   Lipid panel   HgB A1c   TSH   CKD stage 2 due to type 2 diabetes mellitus (HCC)   Repeat cmp, on acei       Relevant Orders   Comp Met (CMET)     Other   Polycythemia   H/o of PE, on xarelto .  Had referred to heme for management, pt never made appt. Re-referred today.      Relevant Orders   CBC w/Diff   Ambulatory referral to Hematology / Oncology   Other Visit Diagnoses       Annual physical exam    -  Primary     Elevated hemoglobin (HCC)       Relevant Orders   Ambulatory referral to Hematology / Oncology     Breast cancer screening by mammogram       Relevant Orders   MM 3D SCREENING MAMMOGRAM BILATERAL BREAST        Return in about 4 months (around 09/15/2024) for DMII.     Trenton Frock, PA-C  [facility] [phone] (phone) [phone] (fax)  [facility]

## 2024-05-22 LAB — TSH: TSH: 1.24 u[IU]/mL (ref 0.35–5.50)

## 2024-05-23 ENCOUNTER — Ambulatory Visit: Payer: Self-pay | Admitting: Physician Assistant

## 2024-05-23 DIAGNOSIS — E1169 Type 2 diabetes mellitus with other specified complication: Secondary | ICD-10-CM

## 2024-05-23 MED ORDER — TRULICITY 4.5 MG/0.5ML ~~LOC~~ SOAJ: 4.5000 mg | SUBCUTANEOUS | 1 refills | Status: AC

## 2024-05-29 ENCOUNTER — Encounter: Payer: Self-pay | Admitting: Hematology & Oncology

## 2024-06-06 ENCOUNTER — Inpatient Hospital Stay (HOSPITAL_BASED_OUTPATIENT_CLINIC_OR_DEPARTMENT_OTHER): Admitting: Family

## 2024-06-06 ENCOUNTER — Other Ambulatory Visit: Payer: Self-pay | Admitting: Family

## 2024-06-06 ENCOUNTER — Encounter: Payer: Self-pay | Admitting: Family

## 2024-06-06 ENCOUNTER — Inpatient Hospital Stay: Attending: Hematology & Oncology

## 2024-06-06 VITALS — BP 106/72 | HR 90 | Temp 98.7°F | Resp 19 | Ht 67.75 in | Wt 267.4 lb

## 2024-06-06 DIAGNOSIS — Z86711 Personal history of pulmonary embolism: Secondary | ICD-10-CM | POA: Diagnosis not present

## 2024-06-06 DIAGNOSIS — D751 Secondary polycythemia: Secondary | ICD-10-CM

## 2024-06-06 LAB — CBC WITH DIFFERENTIAL (CANCER CENTER ONLY)
Abs Immature Granulocytes: 0.06 10*3/uL (ref 0.00–0.07)
Basophils Absolute: 0 10*3/uL (ref 0.0–0.1)
Basophils Relative: 1 %
Eosinophils Absolute: 0.1 10*3/uL (ref 0.0–0.5)
Eosinophils Relative: 1 %
HCT: 47 % — ABNORMAL HIGH (ref 36.0–46.0)
Hemoglobin: 15.9 g/dL — ABNORMAL HIGH (ref 12.0–15.0)
Immature Granulocytes: 1 %
Lymphocytes Relative: 44 %
Lymphs Abs: 3.6 10*3/uL (ref 0.7–4.0)
MCH: 30.5 pg (ref 26.0–34.0)
MCHC: 33.8 g/dL (ref 30.0–36.0)
MCV: 90 fL (ref 80.0–100.0)
Monocytes Absolute: 0.6 10*3/uL (ref 0.1–1.0)
Monocytes Relative: 7 %
Neutro Abs: 3.9 10*3/uL (ref 1.7–7.7)
Neutrophils Relative %: 46 %
Platelet Count: 292 10*3/uL (ref 150–400)
RBC: 5.22 MIL/uL — ABNORMAL HIGH (ref 3.87–5.11)
RDW: 12.3 % (ref 11.5–15.5)
WBC Count: 8.3 10*3/uL (ref 4.0–10.5)
nRBC: 0 % (ref 0.0–0.2)

## 2024-06-06 LAB — LACTATE DEHYDROGENASE: LDH: 162 U/L (ref 98–192)

## 2024-06-06 LAB — CMP (CANCER CENTER ONLY)
ALT: 46 U/L — ABNORMAL HIGH (ref 0–44)
AST: 32 U/L (ref 15–41)
Albumin: 4.4 g/dL (ref 3.5–5.0)
Alkaline Phosphatase: 50 U/L (ref 38–126)
Anion gap: 9 (ref 5–15)
BUN: 17 mg/dL (ref 6–20)
CO2: 27 mmol/L (ref 22–32)
Calcium: 9.6 mg/dL (ref 8.9–10.3)
Chloride: 104 mmol/L (ref 98–111)
Creatinine: 1.21 mg/dL — ABNORMAL HIGH (ref 0.44–1.00)
GFR, Estimated: 51 mL/min — ABNORMAL LOW (ref >60)
Glucose, Bld: 124 mg/dL — ABNORMAL HIGH (ref 70–99)
Potassium: 4.1 mmol/L (ref 3.5–5.1)
Sodium: 140 mmol/L (ref 135–145)
Total Bilirubin: 0.4 mg/dL (ref 0.0–1.2)
Total Protein: 7.1 g/dL (ref 6.5–8.1)

## 2024-06-06 LAB — RETICULOCYTES
Immature Retic Fract: 12.9 % (ref 2.3–15.9)
RBC.: 5.17 MIL/uL — ABNORMAL HIGH (ref 3.87–5.11)
Retic Count, Absolute: 95.6 10*3/uL (ref 19.0–186.0)
Retic Ct Pct: 1.9 % (ref 0.4–3.1)

## 2024-06-06 LAB — IRON AND IRON BINDING CAPACITY (CC-WL,HP ONLY)
Iron: 70 ug/dL (ref 28–170)
Saturation Ratios: 15 % (ref 10.4–31.8)
TIBC: 472 ug/dL — ABNORMAL HIGH (ref 250–450)
UIBC: 402 ug/dL

## 2024-06-06 LAB — FERRITIN: Ferritin: 87 ng/mL (ref 11–307)

## 2024-06-06 NOTE — Progress Notes (Unsigned)
 SABRA

## 2024-06-07 LAB — ERYTHROPOIETIN: Erythropoietin: 9.8 m[IU]/mL (ref 2.6–18.5)

## 2024-06-09 NOTE — Progress Notes (Signed)
 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  Hgb today is 15.9, Hct 47%, platelets 292 and WBC count 8.3.  Iron studies were stable with saturation of 15% and ferritin 85.  Epo level is 9.8.  No itching. She has a brown speckled rash on the lower left leg that is asymptomatic.  Her left foot is also reddish purple in color. Pedal pulses are equal at 1+.  She denies using any HRT.  No falls or syncope reported.  No hot flashes or night sweats.  No history of persistent headaches or migraines.  SOB only associated with sinus congestion and drainage.  No issue with frequent or recurrent infections. No fever, chills, n/v, cough, dizziness, SOB, chest pain, palpitations, abdominal pain or changes in bowel or bladder habits at this time.  Past surgical history includes tonsillectomy and C-section without any complications.  She has past history of PE initially in 2007 treated with 6 months of coumadin and then again in 2020 and has been on Xarelto  20 mg PO daily since that time.  She states that she has had veins in both legs treated by vascular and vein specialists.  She has had no other thrombotic events.  Her sister has history of DVTs and has seen us  in the past (Hyper coag panel was negative).  She states that she had history of Hashimoto's but this resolved some years ago.  She has type II diabetes and is currently on Trulicity  and Jardiance . Hgb A1c is 8.3.  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  Paternal uncle had history of lung cancer (smoker).   No  smoking, ETOH or recreational drug use.  Appetite and hydration are good. Weight is stable at 267 lbs.   ROS: All other 10 point review of systems is negative.   PAST MEDICAL HISTORY:   Past Medical History:  Diagnosis Date   Clotting disorder (HCC)    Fibrocystic breast    Hyperlipidemia    Hypertension    Hypothyroidism    Iron deficiency anemia    Obesity    BMI 41   Prediabetes    Pulmonary embolism (HCC) 2006   from BCP   Vitamin D  deficiency     ALLERGIES: Allergies  Allergen Reactions   Farxiga  [Dapagliflozin ] Other (See Comments)    Severe sinus infeciton   Crestor  [Rosuvastatin ]     Hair Loss    Penicillins Rash    Did it involve swelling of the face/tongue/throat, SOB, or low BP? No Did it involve sudden or severe rash/hives, skin peeling, or any reaction on the inside of your mouth or nose? No Did you need to seek medical attention at a hospital or doctor's office? No When did it last happen?       If all above answers are "NO", may proceed with cephalosporin use.      MEDICATIONS:  Current Outpatient Medications on File Prior to Visit  Medication Sig Dispense Refill   Calcium  Carbonate-Vitamin D  (CALCIUM -VITAMIN D3 PO) Take 500 mg by mouth daily.  cetirizine (ZYRTEC) 10 MG tablet Take 10 mg by mouth daily.     cyclobenzaprine  (FLEXERIL ) 10 MG tablet Take 1 tablet (10 mg total) by mouth 3 (three) times daily as needed for muscle spasms. 30 tablet 1   Dulaglutide  (TRULICITY ) 4.5 MG/0.5ML SOAJ Inject 4.5 mg as directed once a week. 6 mL 1   empagliflozin  (JARDIANCE ) 25 MG TABS tablet Take  1 tablet  Daily for Diabetes                                                                          /                                                     TAKE                                     BY                                           MOUTH 90 tablet 3   famotidine  (PEPCID ) 20 MG tablet TAKE 1 TABLET TWICE A DAY AS NEEDED FOR ACID INDIGESTION & REFLUX 180 tablet 3    fluticasone (FLONASE) 50 MCG/ACT nasal spray Place into both nostrils daily.     lisinopril  (ZESTRIL ) 20 MG tablet TAKE 1 TABLET DAILY FOR BLOOD PRESSURE & DIABETIC KIDNEY PROTECTION 90 tablet 1   montelukast  (SINGULAIR ) 10 MG tablet Take 1 tablet (10 mg total) by mouth daily. 90 tablet 1   naproxen sodium (ALEVE) 220 MG tablet Take 220 mg by mouth. (Patient taking differently: Take 220 mg by mouth daily as needed.)     OVER THE COUNTER MEDICATION Take by mouth daily at 6 (six) AM. 2 tablespoons daily of [employer] Multivitamin with Biotin 1000mg  for hair growth     Phenylephrine-Aspirin  (ALKA-SELTZER PLUS SINUS PO) Take by mouth as needed.     XARELTO  20 MG TABS tablet TAKE 1 TABLET DAILY TO PREVENT BLOOD CLOTS - MUST HAVE OFFICE VISIT BEFORE REFILL 30 tablet 5   Saw Palmetto, Serenoa repens, (SAW PALMETTO PO) Take by mouth. (Patient not taking: Reported on 06/06/2024)     No current facility-administered medications on file prior to visit.     PAST SURGICAL HISTORY Past Surgical History:  Procedure Laterality Date   ABLATION  2006   THYROID  SURGERY     Nodule benign   TONSILLECTOMY AND ADENOIDECTOMY     TUBAL LIGATION Bilateral     FAMILY HISTORY: Family History  Problem Relation Age of Onset   Cancer Mother        Cancer   Osteoporosis Mother    Diabetes Father    Colon cancer Neg Hx    Stomach cancer Neg Hx    Pancreatic cancer Neg Hx    Esophageal cancer Neg Hx     SOCIAL HISTORY:  reports  that she has never smoked. She has never used smokeless tobacco. She reports that she does not drink alcohol and does not use drugs.  PERFORMANCE STATUS: The patient's performance status is 1 - Symptomatic but completely ambulatory  PHYSICAL EXAM: Most Recent Vital Signs: Blood pressure 106/72, pulse 90, temperature 98.7 F (37.1 C), temperature source Oral, resp. rate 19, height 5' 7.75 (1.721 m), weight 267 lb 6.4 oz (121.3 kg), SpO2 100%. BP 106/72 (BP Location: Right  Wrist, Patient Position: Sitting)   Pulse 90   Temp 98.7 F (37.1 C) (Oral)   Resp 19   Ht 5' 7.75 (1.721 m)   Wt 267 lb 6.4 oz (121.3 kg)   SpO2 100%   BMI 40.96 kg/m   General Appearance:    Alert, cooperative, no distress, appears stated age  Head:    Normocephalic, without obvious abnormality, atraumatic  Eyes:    PERRL, conjunctiva/corneas clear, EOM's intact, fundi    benign, both eyes        Throat:   Lips, mucosa, and tongue normal; teeth and gums normal  Neck:   Supple, symmetrical, trachea midline, no adenopathy;    thyroid :  no enlargement/tenderness/nodules; no carotid   bruit or JVD  Back:     Symmetric, no curvature, ROM normal, no CVA tenderness  Lungs:     Clear to auscultation bilaterally, respirations unlabored  Chest Wall:    No tenderness or deformity   Heart:    Regular rate and rhythm, S1 and S2 normal, no murmur, rub   or gallop     Abdomen:     Soft, non-tender, bowel sounds active all four quadrants,    no masses, no organomegaly        Extremities:   Extremities normal, atraumatic, no cyanosis or edema  Pulses:   2+ and symmetric all extremities  Skin:   Skin color, texture, turgor normal, no rashes or lesions  Lymph nodes:   Cervical, supraclavicular, and axillary nodes normal  Neurologic:   CNII-XII intact, normal strength, sensation and reflexes    throughout    LABORATORY DATA:  Results for orders placed or performed in visit on 06/06/24 (from the past 48 hours)  CBC with Differential (Cancer Center Only)     Status: Abnormal   Collection Time: 06/06/24  2:51 PM  Result Value Ref Range   WBC Count 8.3 4.0 - 10.5 K/uL   RBC 5.22 (H) 3.87 - 5.11 MIL/uL   Hemoglobin 15.9 (H) 12.0 - 15.0 g/dL   HCT 52.9 (H) 63.9 - 53.9 %   MCV 90.0 80.0 - 100.0 fL   MCH 30.5 26.0 - 34.0 pg   MCHC 33.8 30.0 - 36.0 g/dL   RDW 87.6 88.4 - 84.4 %   Platelet Count 292 150 - 400 K/uL   nRBC 0.0 0.0 - 0.2 %   Neutrophils Relative % 46 %   Neutro Abs 3.9 1.7 -  7.7 K/uL   Lymphocytes Relative 44 %   Lymphs Abs 3.6 0.7 - 4.0 K/uL   Monocytes Relative 7 %   Monocytes Absolute 0.6 0.1 - 1.0 K/uL   Eosinophils Relative 1 %   Eosinophils Absolute 0.1 0.0 - 0.5 K/uL   Basophils Relative 1 %   Basophils Absolute 0.0 0.0 - 0.1 K/uL   Immature Granulocytes 1 %   Abs Immature Granulocytes 0.06 0.00 - 0.07 K/uL    Comment: Performed at [facility], [address] [place], [place] [address]  Lactate dehydrogenase (LDH)     Status: None   Collection Time: 06/06/24  2:51 PM  Result Value Ref Range   LDH 162 98 - 192 U/L    Comment: Performed at [facility], [address] [place], [place] [address]  Reticulocytes     Status: Abnormal   Collection Time: 06/06/24  2:51 PM  Result Value Ref Range   Retic Ct Pct 1.9 0.4 - 3.1 %   RBC. 5.17 (H) 3.87 - 5.11 MIL/uL   Retic Count, Absolute 95.6 19.0 - 186.0 K/uL   Immature Retic Fract 12.9 2.3 - 15.9 %    Comment: Performed at [facility], [address] [place], [place] [address]  CMP (Cancer Center only)     Status: Abnormal   Collection Time: 06/06/24  2:51 PM  Result Value Ref Range   Sodium 140 135 - 145 mmol/L   Potassium 4.1 3.5 - 5.1 mmol/L   Chloride 104 98 - 111 mmol/L   CO2 27 22 - 32 mmol/L   Glucose, Bld 124 (H) 70 - 99 mg/dL    Comment: Glucose reference range applies only to samples taken after fasting for at least 8 hours.   BUN 17 6 - 20 mg/dL   Creatinine 8.78 (H) 9.55 - 1.00 mg/dL   Calcium  9.6 8.9 - 10.3 mg/dL   Total Protein 7.1 6.5 - 8.1 g/dL   Albumin 4.4 3.5 - 5.0 g/dL   AST 32 15 - 41 U/L   ALT 46 (H) 0 - 44 U/L   Alkaline Phosphatase 50 38 - 126 U/L   Total Bilirubin 0.4 0.0 - 1.2 mg/dL   GFR, Estimated 51 (L) >60 mL/min    Comment: (NOTE) Calculated using the CKD-EPI Creatinine Equation (2021)    Anion gap 9 5 - 15    Comment: Performed at [facility], [address], [place], [place]  [address]      RADIOGRAPHY: No results found.     PATHOLOGY: None  ASSESSMENT/PLAN: Ms. 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  Iron studies are epo level are stable. No indication for Hemochromatosis DNA testing at this time.  JAK 2 pending.  Follow-up and treatment pending these results.   All questions were answered. The patient knows to call the clinic with any problems, questions or concerns. We can certainly see the patient much sooner if necessary.  The patient was discussed with Dr. Timmy and he is in agreement with the aforementioned.   Lauraine Pepper, NP

## 2024-06-16 LAB — JAK2 V617F RFX CALR/MPL/E12-15

## 2024-06-16 LAB — CALR +MPL + E12-E15  (REFLEX)

## 2024-06-17 ENCOUNTER — Telehealth: Payer: Self-pay | Admitting: Family

## 2024-06-17 NOTE — Telephone Encounter (Signed)
 Left message with call back number to discuss lab results and to let her know about 6 months follow-up. If all is stable at that time we will release her back to PCP.

## 2024-06-24 ENCOUNTER — Telehealth: Payer: Self-pay | Admitting: Family

## 2024-06-24 NOTE — Telephone Encounter (Signed)
Left message with call back number to discuss lab results. 

## 2024-08-15 ENCOUNTER — Encounter: Payer: Self-pay | Admitting: Physician Assistant

## 2024-09-18 ENCOUNTER — Other Ambulatory Visit: Payer: Self-pay | Admitting: *Deleted

## 2024-09-18 DIAGNOSIS — J302 Other seasonal allergic rhinitis: Secondary | ICD-10-CM

## 2024-09-18 MED ORDER — MONTELUKAST SODIUM 10 MG PO TABS: 10.0000 mg | ORAL_TABLET | Freq: Every day | ORAL | 1 refills | Status: AC

## 2024-09-18 MED ORDER — LISINOPRIL 20 MG PO TABS: ORAL_TABLET | ORAL | 1 refills | Status: AC

## 2024-10-22 ENCOUNTER — Other Ambulatory Visit: Payer: Self-pay

## 2024-10-22 MED ORDER — RIVAROXABAN 20 MG PO TABS: 20.0000 mg | ORAL_TABLET | Freq: Every day | ORAL | 1 refills | Status: AC

## 2024-10-22 NOTE — Telephone Encounter (Signed)
 Previous Andrea Bush patient. She is scheduled to see Waddell for a TOC appt in June 2026. [name] normally refills these meds however she is out of contact/office for a few days. Please advise?

## 2024-12-19 ENCOUNTER — Other Ambulatory Visit: Payer: Self-pay | Admitting: Neurology

## 2024-12-19 DIAGNOSIS — E1169 Type 2 diabetes mellitus with other specified complication: Secondary | ICD-10-CM

## 2024-12-19 NOTE — Telephone Encounter (Signed)
 Received request for refill of Truc[employer], last seen by Drubal in June 2025, told to follow up in October. She has transfer of care scheduled in July of this year. Needs sooner appt per Almarie, can see any provider for [facility] appt sooner than July.

## 2025-01-12 ENCOUNTER — Ambulatory Visit: Admitting: Family Medicine

## 2025-01-15 ENCOUNTER — Encounter: Payer: Self-pay | Admitting: Family Medicine

## 2025-01-15 ENCOUNTER — Ambulatory Visit: Admitting: Family Medicine

## 2025-01-15 VITALS — BP 129/69 | HR 90 | Ht 67.75 in | Wt 264.0 lb

## 2025-01-15 DIAGNOSIS — E1169 Type 2 diabetes mellitus with other specified complication: Secondary | ICD-10-CM

## 2025-01-15 DIAGNOSIS — Z86711 Personal history of pulmonary embolism: Secondary | ICD-10-CM

## 2025-01-15 DIAGNOSIS — E559 Vitamin D deficiency, unspecified: Secondary | ICD-10-CM

## 2025-01-15 DIAGNOSIS — M62838 Other muscle spasm: Secondary | ICD-10-CM

## 2025-01-15 DIAGNOSIS — D751 Secondary polycythemia: Secondary | ICD-10-CM

## 2025-01-15 DIAGNOSIS — Z0000 Encounter for general adult medical examination without abnormal findings: Secondary | ICD-10-CM

## 2025-01-15 DIAGNOSIS — I10 Essential (primary) hypertension: Secondary | ICD-10-CM

## 2025-01-15 DIAGNOSIS — E1122 Type 2 diabetes mellitus with diabetic chronic kidney disease: Secondary | ICD-10-CM

## 2025-01-15 DIAGNOSIS — Z1231 Encounter for screening mammogram for malignant neoplasm of breast: Secondary | ICD-10-CM

## 2025-01-15 LAB — CBC WITH DIFFERENTIAL/PLATELET
Basophils Absolute: 0.1 10*3/uL (ref 0.0–0.1)
Basophils Relative: 0.7 % (ref 0.0–3.0)
Eosinophils Absolute: 0.2 10*3/uL (ref 0.0–0.7)
Eosinophils Relative: 2.1 % (ref 0.0–5.0)
HCT: 46.8 % — ABNORMAL HIGH (ref 36.0–46.0)
Hemoglobin: 15.9 g/dL — ABNORMAL HIGH (ref 12.0–15.0)
Lymphocytes Relative: 43.5 % (ref 12.0–46.0)
Lymphs Abs: 3.5 10*3/uL (ref 0.7–4.0)
MCHC: 34 g/dL (ref 30.0–36.0)
MCV: 93 fl (ref 78.0–100.0)
Monocytes Absolute: 0.5 10*3/uL (ref 0.1–1.0)
Monocytes Relative: 6.5 % (ref 3.0–12.0)
Neutro Abs: 3.9 10*3/uL (ref 1.4–7.7)
Neutrophils Relative %: 47.2 % (ref 43.0–77.0)
Platelets: 285 10*3/uL (ref 150.0–400.0)
RBC: 5.03 Mil/uL (ref 3.87–5.11)
RDW: 12.8 % (ref 11.5–15.5)
WBC: 8.2 10*3/uL (ref 4.0–10.5)

## 2025-01-15 LAB — LIPID PANEL
Cholesterol: 289 mg/dL — ABNORMAL HIGH (ref 28–200)
HDL: 48.5 mg/dL
LDL Cholesterol: 162 mg/dL — ABNORMAL HIGH (ref 10–99)
NonHDL: 240.71
Total CHOL/HDL Ratio: 6
Triglycerides: 392 mg/dL — ABNORMAL HIGH (ref 10.0–149.0)
VLDL: 78.4 mg/dL — ABNORMAL HIGH (ref 0.0–40.0)

## 2025-01-15 LAB — COMPREHENSIVE METABOLIC PANEL WITH GFR
ALT: 42 U/L — ABNORMAL HIGH (ref 3–35)
AST: 29 U/L (ref 5–37)
Albumin: 4.2 g/dL (ref 3.5–5.2)
Alkaline Phosphatase: 44 U/L (ref 39–117)
BUN: 23 mg/dL (ref 6–23)
CO2: 29 meq/L (ref 19–32)
Calcium: 9.8 mg/dL (ref 8.4–10.5)
Chloride: 101 meq/L (ref 96–112)
Creatinine, Ser: 1.02 mg/dL (ref 0.40–1.20)
GFR: 59.68 mL/min — ABNORMAL LOW
Glucose, Bld: 189 mg/dL — ABNORMAL HIGH (ref 70–99)
Potassium: 4.2 meq/L (ref 3.5–5.1)
Sodium: 136 meq/L (ref 135–145)
Total Bilirubin: 0.5 mg/dL (ref 0.2–1.2)
Total Protein: 7.4 g/dL (ref 6.0–8.3)

## 2025-01-15 LAB — TSH: TSH: 2.01 u[IU]/mL (ref 0.35–5.50)

## 2025-01-15 LAB — VITAMIN D 25 HYDROXY (VIT D DEFICIENCY, FRACTURES): VITD: 53.15 ng/mL (ref 30.00–100.00)

## 2025-01-15 LAB — HEMOGLOBIN A1C: Hgb A1c MFr Bld: 8.4 % — ABNORMAL HIGH (ref 4.6–6.5)

## 2025-01-15 NOTE — Assessment & Plan Note (Signed)
 Blood pressure is at goal for age and co-morbidities.   Recommendations: lisinopril  20 mg daily  - BP goal <130/80 - monitor and log blood pressures at home - check around the same time each day in a relaxed setting - Limit salt to <2000 mg/day - Follow DASH eating plan (heart healthy diet) - limit alcohol to 2 standard drinks per day for men and 1 per day for women - avoid tobacco products - get at least 2 hours of regular aerobic exercise weekly Patient aware of signs/symptoms requiring further/urgent evaluation. Labs updated today.

## 2025-01-15 NOTE — Progress Notes (Signed)
 "   New Patient Office Visit   Subjective     Patient ID: Andrea Bush, female   DOB: May 02, 1966  Age: 59 y.o. MRN: [id number]   CC:  Chief Complaint  Patient presents with   Establish Care      HPI Andrea Bush presents to establish care. She lives with her husband and daughter. 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     Discussed the use of AI scribe software for clinical note transcription with the patient, who gave verbal consent to proceed.  History of Present Illness Andrea Bush is a 59 year old female who presents for a follow-up visit to update her medical records and address her concerns.  She experiences stress-related neck pain, described as a pinching sensation when turning her neck, sometimes radiating. The pain is associated with muscle tension in left upper trap. She believes the pain may be related to stress and her prolonged computer work. She uses Flexeril  sparingly due to its sedative effects, last taken at lunch the previous day, causing drowsiness. Aleve is used as needed for pain relief.  She has significant hair loss over the past couple of years, with noticeable thinning and bare spots. Recently started taking a hair vitamin and observed some new hair growth. She is uncertain if improvement is due to vitamins or discontinuation of Trulicity , which she has not taken for about a month (ran out). No weight loss was experienced while on Trulicity .  Her past medical history includes high cholesterol, hypertension, hypothyroidism, diabetes, a history of pulmonary embolism, and low vitamin D . She has been evaluated by hematology for polycythemia, but no issues were found. She takes several medications, including calcium  and vitamin D  supplements, Zyrtec, Jardiance , lisinopril , Singulair , Flonase, Pepcid , and Xarelto .  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 She acknowledges challenges with exercise and hydration, noting that she sits for long periods due to her job. No current thyroid  medication use.   Hypothyroidism: - reports history, but no current management. States labs stabilized on their own. She has been experiencing hair loss/thinning recently.    Hypertension; CKD stage 2: - Medications: Lisinopril  20 mg daily.  - Compliance: good - Checking BP at home: no - Denies any SOB, recurrent headaches, CP, vision changes, LE edema, dizziness, palpitations, or medication side effects.  BP Readings from Last 3 Encounters:  01/15/25 129/69  06/06/24 106/72  05/16/24 109/73   Lab Results  Component Value Date   NA 140 06/06/2024   CL 104 06/06/2024   K 4.1 06/06/2024   CO2 27 06/06/2024   BUN 17 06/06/2024   CREATININE 1.21 (H) 06/06/2024   GFRNONAA 51 (L) 06/06/2024   CALCIUM  9.6 06/06/2024   ALBUMIN 4.4 06/06/2024   GLUCOSE 124 (H) 06/06/2024    Diabetes: - Checking glucose at home:  - Medications: Trulicity  4.5 mg weekly and Jardiance  25 mg daily.  - Compliance: ran out of Trulicity  a month ago - wants to see what labs look like before restarting  - Diet: general  - Exercise: minimal - Eye exam: pt to schedule - Foot exam: UTD - Microalbumin: UTD - Denies symptoms of hypoglycemia, polyuria, polydipsia, numbness extremities, foot ulcers/trauma, wounds that are not healing, medication side effects  Lab Results  Component Value Date   HGBA1C 8.3 (H) 05/16/2024     History of PE (2007) and polycythemia/erythrocytosis: -  She had a consult with our hematology department on 06/06/2024 with stable labs (hx negative hypercoag panel), occasional monitoring, no further intervention needed.  - DVT 2007; management = Xarelto      Show/hide medication list[1] Past Medical History:  Diagnosis Date   Clotting disorder    Fibrocystic breast    Hyperlipidemia    Hypertension    Hypothyroidism    Iron deficiency  anemia    Obesity    BMI 41   Prediabetes    Pulmonary embolism (HCC) 2006   from BCP   Vitamin D  deficiency     Past Surgical History:  Procedure Laterality Date   ABLATION  2006   THYROID  SURGERY     Nodule benign   TONSILLECTOMY AND ADENOIDECTOMY     TUBAL LIGATION Bilateral      Family History  Problem Relation Age of Onset   Cancer Mother        Cancer   Osteoporosis Mother    Diabetes Father    Colon cancer Neg Hx    Stomach cancer Neg Hx    Pancreatic cancer Neg Hx    Esophageal cancer Neg Hx     Social History   Socioeconomic History   Marital status: Single    Spouse name: Not on file   Number of children: Not on file   Years of education: Not on file   Highest education level: Not on file  Occupational History   Not on file  Tobacco Use   Smoking status: Never   Smokeless tobacco: Never  Vaping Use   Vaping status: Never Used  Substance and Sexual Activity   Alcohol use: No   Drug use: No   Sexual activity: Not on file  Other Topics Concern   Not on file  Social History Narrative   Not on file   Social Drivers of Health   Tobacco Use: Low Risk (01/15/2025)   Patient History    Smoking Tobacco Use: Never    Smokeless Tobacco Use: Never    Passive Exposure: Not on file  Financial Resource Strain: Low Risk (06/06/2024)   Overall Financial Resource Strain (CARDIA)    Difficulty of Paying Living Expenses: Not hard at all  Food Insecurity: No Food Insecurity (06/06/2024)   Epic    Worried About Radiation Protection Practitioner of Food in the Last Year: Never true    Ran Out of Food in the Last Year: Never true  Transportation Needs: No Transportation Needs (06/06/2024)   Epic    Lack of Transportation (Medical): No    Lack of Transportation (Non-Medical): No  Physical Activity: Inactive (06/06/2024)   Exercise Vital Sign    Days of Exercise per Week: 0 days    Minutes of Exercise per Session: 0 min  Stress: Stress Concern Present (06/06/2024)   [employer]  of Occupational Health - Occupational Stress Questionnaire    Feeling of Stress: Very much  Social Connections: Moderately Isolated (06/06/2024)   Social Connection and Isolation Panel    Frequency of Communication with Friends and Family: More than three times a week    Frequency of Social Gatherings with Friends and Family: Three times a week    Attends Religious Services: Never    Active Member of Clubs or Organizations: No    Attends Banker Meetings: Never    Marital Status: Married  Depression (PHQ2-9): Low Risk (01/15/2025)   Depression (PHQ2-9)    PHQ-2 Score: 0  Alcohol Screen: Low Risk (06/06/2024)  Alcohol Screen    Last Alcohol Screening Score (AUDIT): 0  Housing: Low Risk (06/06/2024)   Epic    Unable to Pay for Housing in the Last Year: No    Number of Times Moved in the Last Year: 0    Homeless in the Last Year: No  Utilities: Not At Risk (06/06/2024)   Epic    Threatened with loss of utilities: No  Health Literacy: Adequate Health Literacy (06/06/2024)   B1300 Health Literacy    Frequency of need for help with medical instructions: Never       ROS All review of systems negative except what is listed in the HPI    Objective     BP 129/69   Pulse 90   Ht 5' 7.75 (1.721 m)   Wt 264 lb (119.7 kg)   SpO2 98%   BMI 40.44 kg/m   Physical Exam Vitals reviewed.  Constitutional:      General: She is not in acute distress.    Appearance: Normal appearance. She is obese. She is not ill-appearing.  Cardiovascular:     Rate and Rhythm: Normal rate and regular rhythm.     Heart sounds: Normal heart sounds.  Pulmonary:     Effort: Pulmonary effort is normal.     Breath sounds: Normal breath sounds.  Musculoskeletal:     Right lower leg: No edema.     Left lower leg: No edema.  Skin:    General: Skin is warm and dry.  Neurological:     Mental Status: She is alert and oriented to person, place, and time.  Psychiatric:        Mood and Affect: Mood  normal.        Behavior: Behavior normal.        Thought Content: Thought content normal.        Judgment: Judgment normal.           Assessment & Plan:     Problem List Items Addressed This Visit       Active Problems   Vitamin D  deficiency   Labs today       Relevant Orders   VITAMIN D  25 Hydroxy (Vit-D Deficiency, Fractures)   Hypertension   Blood pressure is at goal for age and co-morbidities.   Recommendations: lisinopril  20 mg daily  - BP goal <130/80 - monitor and log blood pressures at home - check around the same time each day in a relaxed setting - Limit salt to <2000 mg/day - Follow DASH eating plan (heart healthy diet) - limit alcohol to 2 standard drinks per day for men and 1 per day for women - avoid tobacco products - get at least 2 hours of regular aerobic exercise weekly Patient aware of signs/symptoms requiring further/urgent evaluation. Labs updated today.       Relevant Orders   Lipid panel   Comprehensive metabolic panel with GFR   CBC with Differential/Platelet   TSH   Type 2 diabetes mellitus with hyperlipidemia (HCC) - Primary   Type 2 diabetes managed with Jardiance  for glycemic control and renal protection. She ran out of Trulicity  about a month ago. Metformin  previously avoided due to GI side effects. Family history of renal failure noted. - Ordered blood work for diabetes control and kidney function. - Discuss potential reintroduction of Trulicity  at low dose if hyperglycemia persists, monitor hair loss. - Continue Jardiance  25 mg. - Encouraged low carb diet and regular exercise.  Relevant Orders   Hemoglobin A1c   Lipid panel   Comprehensive metabolic panel with GFR   History of pulmonary embolus (PE)   Two episodes -> anticoagulation for life. Doing well with Xarelto .      CKD stage 2 due to type 2 diabetes mellitus (HCC)   Hydrate and monitor. Avoid nephrotoxic drugs.       Relevant Orders   Comprehensive metabolic  panel with GFR   Polycythemia   Stable labs with hematology, no follow-up required. Continue monitoring labs. On Xarelto .       Other Visit Diagnoses       Encounter for medical examination to establish care          Encounter for screening mammogram for malignant neoplasm of breast       Relevant Orders   MM 3D SCREENING MAMMOGRAM BILATERAL BREAST     Neck muscle spasm     Neck pain with spasms likely due to stress and prolonged computer use.  - Continue Flexeril  as needed, option to halve tablets; caution for drowsiness.  - Use Aleve twice daily as needed during flares.  - Heat, massage, stretching, etc recommended. Consider PT if not improving.            Return in about 6 months (around 07/15/2025) for chronic disease management.  Waddell KATHEE Mon, NP  I,Emily Lagle,acting as a scribe for Waddell KATHEE Mon, NP.,have documented all relevant documentation on the behalf of Waddell KATHEE Mon, NP.  I, Waddell KATHEE Mon, NP, have reviewed all documentation for this visit. The documentation on 01/15/2025 for the exam, diagnosis, procedures, and orders are all accurate and complete.      [1]  Outpatient Medications Prior to Visit  Medication Sig   Calcium  Carbonate-Vitamin D  (CALCIUM -VITAMIN D3 PO) Take 500 mg by mouth daily.   cetirizine (ZYRTEC) 10 MG tablet Take 10 mg by mouth daily.   cyclobenzaprine  (FLEXERIL ) 10 MG tablet Take 1 tablet (10 mg total) by mouth 3 (three) times daily as needed for muscle spasms.   empagliflozin  (JARDIANCE ) 25 MG TABS tablet Take  1 tablet  Daily for Diabetes                                                                          /                                                     TAKE                                     BY                                           MOUTH   famotidine  (PEPCID ) 20 MG tablet TAKE 1 TABLET TWICE A DAY AS NEEDED FOR ACID INDIGESTION & REFLUX   fluticasone (FLONASE) 50 MCG/ACT  nasal spray Place into both nostrils daily.    lisinopril  (ZESTRIL ) 20 MG tablet TAKE 1 TABLET DAILY FOR BLOOD PRESSURE & DIABETIC KIDNEY PROTECTION   montelukast  (SINGULAIR ) 10 MG tablet Take 1 tablet (10 mg total) by mouth daily.   naproxen sodium (ALEVE) 220 MG tablet Take 220 mg by mouth. (Patient taking differently: Take 220 mg by mouth daily as needed.)   OVER THE COUNTER MEDICATION Take by mouth daily at 6 (six) AM. 2 tablespoons daily of [employer] Multivitamin with Biotin 1000mg  for hair growth   Phenylephrine-Aspirin  (ALKA-SELTZER PLUS SINUS PO) Take by mouth as needed.   rivaroxaban  (XARELTO ) 20 MG TABS tablet Take 1 tablet (20 mg total) by mouth daily with supper.   Saw Palmetto, Serenoa repens, (SAW PALMETTO PO) Take by mouth.   Dulaglutide  (TRULICITY ) 4.5 MG/0.5ML SOAJ Inject 4.5 mg as directed once a week. (Patient not taking: Reported on 01/15/2025)   No facility-administered medications prior to visit.   "

## 2025-01-15 NOTE — Assessment & Plan Note (Signed)
 Stable labs with hematology, no follow-up required. Continue monitoring labs. On Xarelto .

## 2025-01-15 NOTE — Assessment & Plan Note (Signed)
 Hydrate and monitor. Avoid nephrotoxic drugs.

## 2025-01-15 NOTE — Assessment & Plan Note (Signed)
 Two episodes -> anticoagulation for life. Doing well with Xarelto .

## 2025-01-15 NOTE — Assessment & Plan Note (Signed)
 Type 2 diabetes managed with Jardiance  for glycemic control and renal protection. She ran out of Trulicity  about a month ago. Metformin  previously avoided due to GI side effects. Family history of renal failure noted. - Ordered blood work for diabetes control and kidney function. - Discuss potential reintroduction of Trulicity  at low dose if hyperglycemia persists, monitor hair loss. - Continue Jardiance  25 mg. - Encouraged low carb diet and regular exercise.

## 2025-01-15 NOTE — Assessment & Plan Note (Signed)
 Labs today.

## 2025-01-16 ENCOUNTER — Ambulatory Visit: Payer: Self-pay | Admitting: Family Medicine

## 2025-02-02 ENCOUNTER — Ambulatory Visit: Admitting: Family Medicine

## 2025-05-19 ENCOUNTER — Encounter: Admitting: Family Medicine

## 2025-05-19 ENCOUNTER — Encounter: Admitting: Physician Assistant

## 2025-06-22 ENCOUNTER — Encounter: Admitting: Family Medicine

## 2025-07-15 ENCOUNTER — Ambulatory Visit: Admitting: Family Medicine
